# Patient Record
Sex: Female | Born: 1935 | Race: White | Hispanic: No | Smoking: Never smoker
Health system: Southern US, Community
[De-identification: ages and names within clinical notes are randomized; demographics above are authoritative.]

## PROBLEM LIST (undated history)

## (undated) DIAGNOSIS — K219 Gastro-esophageal reflux disease without esophagitis: Secondary | ICD-10-CM

## (undated) DIAGNOSIS — M810 Age-related osteoporosis without current pathological fracture: Secondary | ICD-10-CM

## (undated) DIAGNOSIS — R0602 Shortness of breath: Secondary | ICD-10-CM

## (undated) DIAGNOSIS — I2510 Atherosclerotic heart disease of native coronary artery without angina pectoris: Secondary | ICD-10-CM

## (undated) DIAGNOSIS — M1990 Unspecified osteoarthritis, unspecified site: Secondary | ICD-10-CM

## (undated) DIAGNOSIS — IMO0002 Reserved for concepts with insufficient information to code with codable children: Secondary | ICD-10-CM

## (undated) DIAGNOSIS — J942 Hemothorax: Secondary | ICD-10-CM

## (undated) DIAGNOSIS — I10 Essential (primary) hypertension: Secondary | ICD-10-CM

## (undated) DIAGNOSIS — R609 Edema, unspecified: Secondary | ICD-10-CM

## (undated) DIAGNOSIS — J189 Pneumonia, unspecified organism: Secondary | ICD-10-CM

## (undated) DIAGNOSIS — E559 Vitamin D deficiency, unspecified: Secondary | ICD-10-CM

## (undated) DIAGNOSIS — H269 Unspecified cataract: Secondary | ICD-10-CM

## (undated) DIAGNOSIS — I509 Heart failure, unspecified: Secondary | ICD-10-CM

## (undated) DIAGNOSIS — I252 Old myocardial infarction: Secondary | ICD-10-CM

## (undated) DIAGNOSIS — I739 Peripheral vascular disease, unspecified: Secondary | ICD-10-CM

## (undated) DIAGNOSIS — W19XXXA Unspecified fall, initial encounter: Secondary | ICD-10-CM

## (undated) DIAGNOSIS — R7989 Other specified abnormal findings of blood chemistry: Secondary | ICD-10-CM

## (undated) DIAGNOSIS — H9190 Unspecified hearing loss, unspecified ear: Secondary | ICD-10-CM

## (undated) DIAGNOSIS — F40240 Claustrophobia: Secondary | ICD-10-CM

## (undated) DIAGNOSIS — I779 Disorder of arteries and arterioles, unspecified: Secondary | ICD-10-CM

## (undated) DIAGNOSIS — Y92009 Unspecified place in unspecified non-institutional (private) residence as the place of occurrence of the external cause: Secondary | ICD-10-CM

## (undated) HISTORY — DX: Essential (primary) hypertension: I10

## (undated) HISTORY — DX: Unspecified place in unspecified non-institutional (private) residence as the place of occurrence of the external cause: Y92.009

## (undated) HISTORY — DX: Heart failure, unspecified: I50.9

## (undated) HISTORY — PX: CARDIAC CATHETERIZATION: SHX172

## (undated) HISTORY — DX: Unspecified fall, initial encounter: W19.XXXA

## (undated) HISTORY — DX: Edema, unspecified: R60.9

## (undated) HISTORY — DX: Atherosclerotic heart disease of native coronary artery without angina pectoris: I25.10

## (undated) HISTORY — DX: Hemothorax: J94.2

## (undated) HISTORY — PX: APPENDECTOMY: SHX54

## (undated) HISTORY — DX: Peripheral vascular disease, unspecified: I73.9

## (undated) HISTORY — DX: Old myocardial infarction: I25.2

## (undated) HISTORY — DX: Reserved for concepts with insufficient information to code with codable children: IMO0002

## (undated) HISTORY — DX: Disorder of arteries and arterioles, unspecified: I77.9

## (undated) HISTORY — PX: TONSILLECTOMY: SUR1361

## (undated) HISTORY — PX: HAND SURGERY: SHX662

---

## 1999-09-18 ENCOUNTER — Ambulatory Visit (HOSPITAL_COMMUNITY): Admission: RE | Admit: 1999-09-18 | Discharge: 1999-09-19 | Payer: Self-pay | Admitting: *Deleted

## 2006-05-05 ENCOUNTER — Ambulatory Visit: Payer: Self-pay | Admitting: Cardiology

## 2006-05-27 ENCOUNTER — Ambulatory Visit: Payer: Self-pay | Admitting: Cardiology

## 2006-06-02 ENCOUNTER — Inpatient Hospital Stay (HOSPITAL_BASED_OUTPATIENT_CLINIC_OR_DEPARTMENT_OTHER): Admission: RE | Admit: 2006-06-02 | Discharge: 2006-06-02 | Payer: Self-pay | Admitting: Cardiology

## 2006-06-02 ENCOUNTER — Ambulatory Visit: Payer: Self-pay | Admitting: Cardiology

## 2006-06-18 ENCOUNTER — Ambulatory Visit: Payer: Self-pay | Admitting: Physician Assistant

## 2007-01-20 ENCOUNTER — Ambulatory Visit: Payer: Self-pay | Admitting: Cardiology

## 2007-01-23 ENCOUNTER — Ambulatory Visit: Payer: Self-pay | Admitting: Cardiology

## 2007-01-23 ENCOUNTER — Encounter: Payer: Self-pay | Admitting: Cardiology

## 2007-05-07 ENCOUNTER — Ambulatory Visit: Payer: Self-pay | Admitting: Cardiology

## 2007-05-21 ENCOUNTER — Ambulatory Visit: Payer: Self-pay | Admitting: Cardiology

## 2007-05-25 ENCOUNTER — Encounter: Payer: Self-pay | Admitting: Cardiology

## 2007-05-26 ENCOUNTER — Inpatient Hospital Stay (HOSPITAL_COMMUNITY): Admission: EM | Admit: 2007-05-26 | Discharge: 2007-05-27 | Payer: Self-pay | Admitting: Cardiovascular Disease

## 2007-05-26 ENCOUNTER — Inpatient Hospital Stay (HOSPITAL_BASED_OUTPATIENT_CLINIC_OR_DEPARTMENT_OTHER): Admission: RE | Admit: 2007-05-26 | Discharge: 2007-05-26 | Payer: Self-pay | Admitting: Cardiovascular Disease

## 2007-05-26 ENCOUNTER — Ambulatory Visit: Payer: Self-pay | Admitting: Cardiovascular Disease

## 2007-05-26 ENCOUNTER — Encounter: Payer: Self-pay | Admitting: Cardiology

## 2007-06-12 ENCOUNTER — Ambulatory Visit: Payer: Self-pay | Admitting: Cardiology

## 2007-07-02 ENCOUNTER — Ambulatory Visit: Payer: Self-pay | Admitting: Cardiology

## 2007-08-04 ENCOUNTER — Encounter: Payer: Self-pay | Admitting: Cardiology

## 2007-10-28 ENCOUNTER — Ambulatory Visit: Payer: Self-pay | Admitting: Cardiology

## 2008-02-19 ENCOUNTER — Encounter: Payer: Self-pay | Admitting: Cardiology

## 2008-06-30 ENCOUNTER — Ambulatory Visit: Payer: Self-pay | Admitting: Cardiology

## 2008-11-08 ENCOUNTER — Encounter: Payer: Self-pay | Admitting: Cardiology

## 2008-11-29 ENCOUNTER — Encounter: Payer: Self-pay | Admitting: Cardiology

## 2008-12-13 DIAGNOSIS — I10 Essential (primary) hypertension: Secondary | ICD-10-CM

## 2008-12-13 DIAGNOSIS — R609 Edema, unspecified: Secondary | ICD-10-CM | POA: Insufficient documentation

## 2009-01-26 ENCOUNTER — Ambulatory Visit: Payer: Self-pay | Admitting: Cardiology

## 2009-05-29 ENCOUNTER — Encounter: Payer: Self-pay | Admitting: Cardiology

## 2009-08-10 ENCOUNTER — Ambulatory Visit: Payer: Self-pay | Admitting: Cardiology

## 2009-08-10 DIAGNOSIS — I6529 Occlusion and stenosis of unspecified carotid artery: Secondary | ICD-10-CM

## 2009-08-10 DIAGNOSIS — E785 Hyperlipidemia, unspecified: Secondary | ICD-10-CM | POA: Insufficient documentation

## 2009-08-18 ENCOUNTER — Encounter: Payer: Self-pay | Admitting: Cardiology

## 2009-08-23 ENCOUNTER — Encounter: Payer: Self-pay | Admitting: Cardiology

## 2009-08-30 ENCOUNTER — Encounter: Payer: Self-pay | Admitting: Cardiology

## 2009-08-30 ENCOUNTER — Ambulatory Visit: Payer: Self-pay | Admitting: Vascular Surgery

## 2010-03-06 ENCOUNTER — Ambulatory Visit: Payer: Self-pay | Admitting: Vascular Surgery

## 2010-03-18 HISTORY — PX: COLONOSCOPY: SHX174

## 2010-04-19 NOTE — Miscellaneous (Signed)
Summary: Orders Update - [facility]  Clinical Lists Changes  Orders: Added new Referral order of [facility] Referral ([facility] Ref) - Signed 

## 2010-04-19 NOTE — Letter (Signed)
Summary: [facility]   [facility]   Imported By: Roderic Ovens 09/27/2009 15:31:22  _____________________________________________________________________  External Attachment:    Type:   Image     Comment:   External Document

## 2010-04-19 NOTE — Letter (Signed)
Summary: External Correspondence/ FAXED [facility]  External Correspondence/ [facility]ED [facility]   Imported By: Dorise Hiss 08/25/2009 11:57:58  _____________________________________________________________________  External Attachment:    Type:   Image     Comment:   External Document

## 2010-04-19 NOTE — Assessment & Plan Note (Signed)
Summary: 6 MO FU PER MAY REMINDER      Allergies Added: ! SULFA  Visit Type:  Follow-up Primary Provider:  Sherryll Burger   History of Present Illness: the patient is a77 year old female with history of single-vessel coronary disease. The patient also past peripheralvascular disease. She reports a single episode of substernal chest pain. This was several weeks ago which required nitroglycerin. The pain was more epigastric with radiation into the right flank. The patient was not sure if this was some indigestion. She reports however no exertional chest pain shortness of breath orthopnea PND chest no palpitations or syncope. An echocardiogram was done by her primary care physician last year which showed normal LV function.  Preventive Screening-Counseling & Management  Alcohol-Tobacco     Smoking Status: never  Current Medications (verified): 1)  Vytorin 10-20 Mg Tabs (Ezetimibe-Simvastatin) .... Take 1 Tablet By Mouth Once A Day 2)  Atenolol 50 Mg Tabs (Atenolol) .... Take 1 Tablet By Mouth Once A Day 3)  Diovan Hct 160-25 Mg Tabs (Valsartan-Hydrochlorothiazide) .... Take 1 Tablet By Mouth Once A Day 4)  Aspirin 81 Mg Tbec (Aspirin) .... Take 1 Tablet By Mouth Once A Day 5)  Nitrostat 0.4 Mg Subl (Nitroglycerin) .... Use As Directed  Allergies (verified): 1)  ! Sulfa  Comments:  Nurse/Medical Assistant: The patient's medications and allergies were reviewed with the patient and were updated in the Medication and Allergy Lists. Bottles reviewed.  Past History:  Past Medical History: Last updated: 01/26/2009 HYPERTENSION, UNSPECIFIED (ICD-401.9) EDEMA (ICD-782.3) CAD, NATIVE VESSEL (ICD-414.01) 1. Coronary artery disease with details above.single-vessel CAD status post stent to the LAD. 2. Edema, resolved. 3. Hypertension, controlled.     Past Surgical History: Last updated: 12/13/2008 Tonsillectomy  Family History: Last updated: 12/13/2008 Family History of Cancer:  Family  History of Coronary Artery Disease:   Social History: Last updated: 12/13/2008 Retired  Widowed  Tobacco Use - Former.  Alcohol Use - no Regular Exercise - yes Drug Use - no  Risk Factors: Smoking Status: never (08/10/2009)  Vital Signs:  Patient profile:   77 year old female Height:      63 inches Weight:      128 pounds Pulse rate:   60 / minute BP sitting:   131 / 75  (left arm) Cuff size:   regular  Vitals Entered By: Carlye Grippe (Aug 10, 2009 1:06 PM)  Physical Exam  Additional Exam:  General: Well-developed, well-nourished in no distress head: Normocephalic and atraumatic eyes PERRLA/EOMI intact, conjunctiva and lids normal nose: No deformity or lesions mouth normal dentition, normal posterior pharynx neck: Supple, no JVD.  No masses, thyromegaly or abnormal cervical nodes. Soft right carotid bruit lungs: Normal breath sounds bilaterally without wheezing.  Normal percussion heart: regular rate and rhythm with normal S1 and S2, no S3 or S4.  PMI is normal.  No pathological murmurs abdomen: Normal bowel sounds, abdomen is soft and nontender without masses, organomegaly or hernias noted.  No hepatosplenomegaly musculoskeletal: Back normal, normal gait muscle strength and tone normal pulsus: Pulse is normal in all 4 extremities Extremities: No peripheral pitting edema neurologic: Alert and oriented x 3 skin: Intact without lesions or rashes cervical nodes: No significant adenopathy psychologic: Normal affect    EKG  Procedure date:  08/10/2009  Findings:      normal sinus rhythm no acute ischemic changes  Impression & Recommendations:  Problem # 1:  CAD, NATIVE VESSEL (ICD-414.01) the patient is single episode of chest pain. He did  not sound suspicious for angina. Her EKG is within normal limits with no intercurrent development of Q waves. The following medications were removed from the medication list:    Plavix 75 Mg Tabs (Clopidogrel bisulfate) .Marland Kitchen...  Take 1 tablet by mouth once a day Her updated medication list for this problem includes:    Atenolol 50 Mg Tabs (Atenolol) .Marland Kitchen... Take 1 tablet by mouth once a day    Aspirin 81 Mg Tbec (Aspirin) .Marland Kitchen... Take 1 tablet by mouth once a day    Nitrostat 0.4 Mg Subl (Nitroglycerin) ..... Use as directed  Orders: EKG w/ Interpretation (93000)  Problem # 2:  CAROTID ARTERY DISEASE (ICD-433.10)  the patient has significant carotid artery disease and will need a followup carotid Doppler. The following medications were removed from the medication list:    Plavix 75 Mg Tabs (Clopidogrel bisulfate) .Marland Kitchen... Take 1 tablet by mouth once a day Her updated medication list for this problem includes:    Aspirin 81 Mg Tbec (Aspirin) .Marland Kitchen... Take 1 tablet by mouth once a day  Orders: Carotid Duplex (Carotid Duplex)  Problem # 3:  HYPERTENSION, UNSPECIFIED (ICD-401.9) blood pressure is well controlled. Her updated medication list for this problem includes:    Atenolol 50 Mg Tabs (Atenolol) .Marland Kitchen... Take 1 tablet by mouth once a day    Diovan Hct 160-25 Mg Tabs (Valsartan-hydrochlorothiazide) .Marland Kitchen... Take 1 tablet by mouth once a day    Aspirin 81 Mg Tbec (Aspirin) .Marland Kitchen... Take 1 tablet by mouth once a day  Problem # 4:  DYSLIPIDEMIA (ICD-272.4) followup the patient's primary care physician. Her updated medication list for this problem includes:    Vytorin 10-20 Mg Tabs (Ezetimibe-simvastatin) .Marland Kitchen... Take 1 tablet by mouth once a day  Patient Instructions: 1)  Carotid Doppler  2)  Stop Plavix 3)  Follow up in  1 year

## 2010-07-20 ENCOUNTER — Telehealth: Payer: Self-pay | Admitting: Cardiology

## 2010-07-23 NOTE — Telephone Encounter (Signed)
Left message at residence to have patient return call.

## 2010-07-26 NOTE — Telephone Encounter (Signed)
No return call as of this date.

## 2010-07-27 ENCOUNTER — Telehealth: Payer: Self-pay | Admitting: Cardiology

## 2010-07-29 HISTORY — PX: PR VEIN BYPASS GRAFT,AORTO-FEM-POP: 35551

## 2010-07-31 NOTE — Assessment & Plan Note (Signed)
[facility]                          [facility] NOTE   NAME:HOPKINSClarise, Chacko                    MRN:          [id number]  DATE:05/21/2007                            DOB:          [birth year]    REFERRING PHYSICIAN:  Kirstie Peri, MD   HISTORY OF PRESENT ILLNESS:  The patient is a 77 year old female with a  history of moderate coronary artery disease but abnormal Cardiolite  study as documented in the note from May 07, 2007.  Regarding the  details regarding this patient's chest and jaw pain in this note, the  patient now presents for follow-up. She had an echo done in Dr. Sherril Croon  office but no results are available as reportedly the note was not  dictated yet.  The patient continues to complain of jaw and neck pain  upon exertion consistent with angina.  Last office visit her amlodipine  was increased with some minor relief in her chest symptomatology.   MEDICATIONS:  Vytorin, atenolol, aspirin, Diovan/hydrochlorothiazide,  amlodipine 10 mg a day.   PHYSICAL EXAMINATION:  VITAL SIGNS:  Blood pressure 122/72, heart rate  68, weights 127 pounds.  NECK:  No carotid upstrokes, no carotid bruits.  LUNGS:  Clear.  HEART:  Regular rate and rhythm.  Normal S1, S2.  ABDOMEN:  Soft.  EXTREMITIES:  No clubbing, cyanosis or edema.  NEURO:  Patient alert, oriented and grossly nonfocal.   PROBLEM LIST:  1. Exertional angina.  2. Coronary artery disease, see details prior note.  3. Normal LV function.  4. Cerebrovascular disease with a right carotid bruit and a 50-69%      previous stenosis in the right ICA.   PLAN:  1. The patient continues to have symptoms of chest pain and tightness      particularly in the jaws despite increasing her Norvasc.  2. The patient had an abnormal Cardiolite study and a given her      ongoing symptomatology will proceed with a cardiac catheterization      to reassess her coronary anatomy.    [name],  MD,FACC  Electronically Signed   GED/MedQ  DD: 05/21/2007  DT: 05/21/2007  Job #: [id number]   cc:   Kirstie Peri, MD

## 2010-07-31 NOTE — Assessment & Plan Note (Signed)
[facility]                          [facility] NOTE   KARIZMA, CHEEK                    MRN:          [id number]  DATE:05/07/2007                            DOB:          May 09, 1929    PRIMARY CARE PHYSICIAN:  Kirstie Peri, M.D.   CARDIOLOGIST:  [name], M.D., F.A.C.C.   REASON FOR VISIT:  Three-month followup.   HISTORY OF PRESENT ILLNESS:  [name] is a 77 year old female patient  with coronary artery disease, who presents to the office today for  followup.  She was last studied with cardiac catheterization by Dr.  Samule Ohm in March 2008.  She had an abnormal stress study prior to this  with normal perfusion, but significantly abnormal electrocardiograms.  She had stable anatomy at that time and was treated medically.  She last  saw Dr. Andee Lineman in November of 2008.  She was complaining of exertional  chest and jaw discomfort.  She was set up for an adenosine Cardiolite  January 23, 2007.  This revealed mild anterior wall ischemia and a  normal EF of 67%.   She returns to the office today for followup.  Overall, she is doing  about the same.  She notes that she has exertional discomfort in her  chest that radiates up into her jaw, down her arms.  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  When she mops, she gets  her discomfort.  When she walks to church, she gets the discomfort.  She  has noted it once in the morning, at rest.  Other than that, she has  mainly had discomfort with exertion.  She does note some nausea, at  times.  She denies associated shortness of breath, diaphoresis or  syncope.  She has taken nitroglycerin on occasion and it helps.  When  Dr. Andee Lineman saw her last, he placed her on amlodipine 5 mg a day.  She  notes just minimal improvement since that time.  She denies any  worsening symptoms since last being seen.   CURRENT MEDICATIONS:  1. Vytorin 10/20 mg daily.  2. Atenolol  50 mg daily.  3. Diovan/HCTZ 160/25 mg daily.  4. Aspirin 81 mg daily.  5. Amlodipine 5 mg daily.  6. Nitroglycerin p.r.n. chest pain.  7. Calcium as needed.   ALLERGIES:  SULFA.   SOCIAL HISTORY:  She denies any tobacco abuse.  She is widowed and lives  with her grandson.   REVIEW OF SYSTEMS:  Please see HPI.  She denies any fevers, chills,  cough, melena, hematochezia, hematuria.  Rest of the review of systems  is negative.   PHYSICAL EXAM:  She is a well-nourished, well-developed female, in no  acute distress.  Blood pressure 116/70, pulse 71, weight 125.2 pounds.  HEENT:  Normal.  NECK:  Without JVD.  CARDIAC:  Normal S1, S2.  Regular rate and rhythm without murmur.  LUNGS:  Clear to auscultation bilaterally.  ABDOMEN:  Soft, nontender.  EXTREMITIES:  Without edema.  Calves are soft, nontender.  SKIN:  Warm and dry.  NEUROLOGIC:  She  is alert and oriented times three.  Cranial nerves II  through XII are grossly intact.  VASCULAR EXAM:  Femoral artery pulses are 2+ bilaterally, without  bruits.   Electrocardiogram reveals heart rate of 63, normal axis, nonspecific STT-  wave changes.   DATA BASE:  Labs performed recently, on April 28, 2007, revealing  creatinine 1.1, normal LFTs, HDL 43, LDL 113, TSH 1.62, hemoglobin 12.6.  Of note, the patient recently restarted her Vytorin approximately two  weeks ago.   IMPRESSION:  1. Stable exertional angina.  2. Coronary artery disease.      a.     Status post rotational atherectomy and balloon angioplasty       and stenting to the RCA in July of 2001.      b.     Nonobstructive coronary artery disease and patent stent in       the distal RCA by catheterization, June 02, 2006; chronically       occluded first obtuse marginal - medical therapy.      c.     Adenosine Cardiolite, November 2008, with mild anterior wall       ischemia.  3. Good LV function.  4. Cerebrovascular disease.      a.     50-69% RICA stenosis -  needs followup in March 2009.  5. Hypertension.  6. Hyperlipidemia.      a.     Followed by Dr. Sherryll Burger.   DISCUSSION:  Ms. Ghattas returns to the office today for followup.  She  continues to complain of stable exertional angina.  There has really  been no significant change.  She has had just minimal response to  amlodipine.  She has been intolerant to Imdur in the past, secondary to  significant headaches.  She was also interviewed and examined by Dr.  Andee Lineman today.   PLAN:  1. Increase her amlodipine to 10 mg daily.  2. Decrease her Diovan/HCTZ 160/25 mg to a half tablet daily.  3. We will have her return in followup in two weeks.  If she continues      to have symptoms of      exertional angina without change, then we will recommend cardiac      catheterization at that point in time to further evaluate her      anatomy.      Tereso Newcomer, PA-C  Electronically Signed      [name], MD,FACC  Electronically Signed   SW/MedQ  DD: 05/07/2007  DT: 05/08/2007  Job #: [id number]   cc:   Kirstie Peri, MD

## 2010-07-31 NOTE — Cardiovascular Report (Signed)
[name], [name]             ACCOUNT NO.:  [id number]   MEDICAL RECORD NO.:  [id number]          PATIENT TYPE:  OIB   LOCATION:  6525                         FACILITY:  [facility]   PHYSICIAN:  Veverly Fells. Excell Seltzer, MD  DATE OF BIRTH:  06/21/1929   DATE OF PROCEDURE:  DATE OF DISCHARGE:                            CARDIAC CATHETERIZATION   PROCEDURE:  Percutaneous transluminal coronary angioplasty and stenting  of the proximal left anterior descending.   INDICATIONS:  [name] is a 76 year old woman who has been  experiencing increasing angina.  She documented moderate CAD from  previous catheterizations.  She underwent a stress test that reproduced  her symptoms and had significant ST depression.  Diagnostic  catheterization showed severe proximal LAD stenosis.  Other findings  were stable compared to a previous catheter.  See the diagnostic cath  report for details.  She was admitted and brought upstairs for a PCI.   Risks and indications of procedure were reviewed with the patient.  Informed consent was obtained.  There was 4-French sheath present in the  right femoral artery.  The patient been preloaded with 600 mg of Plavix  and had received intravenous heparin.  The 4-French sheath was changed  out over a wire using normal sterile technique and upsized to a 6-French  sheath.  Angiomax was started for anticoagulation and initial ACT was  less than 200 prior to starting Angiomax.  Once therapeutic ACT was  achieved, a 6-French XB 3.5-cm side-hole guide catheter was inserted.  The initial angiography confirmed severe proximal LAD stenosis with  moderate to heavy calcification.  The lesion was wired with a moderate  amount of difficulty using a Cougar guidewire.  The lesion was then  predilated with a 2 x 12-mm Maverick balloon up to 10 atmospheres.  The  balloon expanded well.  Following predilatation, I elected to stent the  vessel with a 2.25 x 12-mm Taxus-Atom stent.  Even  though the lesion  location was very proximal in the LAD, the vessel appeared quite small.  The stent was carefully positioned nearly back to the ostium and then  deployed at that was deployed a 12 atmospheres.  It appeared well  expanded.  There was a step-up off the proximal LAD into the stent and a  good transition distally.  I elected to post dilate with 2.25 x 8-mm  Quantum Maverick balloon which was taken to 16 atmospheres on two  inflations.  The patient tolerated the procedure well.  The stent  appeared widely expanded and there was TIMI-3 flow throughout the  vessel.  There were no immediate complications.  There is mild stenosis  on the proximal edge of the stent just at the ostium of the LAD but this  does not appear obstructive.  The guide catheter was removed and the  patient was transferred to the holding area in stable condition where  her femoral arterial sheath will be pulled 2 hours after discontinuation  of Angiomax.   ASSESSMENT:  Successful percutaneous coronary intervention of severe  stenosis in the proximal left anterior descending using a 2.25 x 12-mm  Taxus-Atom  stent.   RECOMMENDATIONS:  Dual antiplatelet therapy with aspirin and Plavix  greater than or equal to 12 months.  The patient will be observed  overnight and if no complications occur, then she will be discharged  home in the morning.      Veverly Fells. Excell Seltzer, MD  Electronically Signed     MDC/MEDQ  D:  05/26/2007  T:  05/27/2007  Job:  [id number]   cc:   [name], MD,FACC

## 2010-07-31 NOTE — Cardiovascular Report (Signed)
[name], [name]             ACCOUNT NO.:  [id number]   MEDICAL RECORD NO.:  [id number]          PATIENT TYPE:  OIB   LOCATION:  NA                           FACILITY:  [facility]   PHYSICIAN:  Veverly Fells. Excell Seltzer, MD  DATE OF BIRTH:  [birth year]   DATE OF PROCEDURE:  05/26/2007  DATE OF DISCHARGE:                            CARDIAC CATHETERIZATION   PROCEDURE:  Left heart catheterization, selective coronary angiography,  left ventricular angiography.   INDICATIONS:  [name] is a 77 year old woman with known CAD.  She  has had moderate stenosis described in her proximal LAD, as well as an  occluded circumflex branch that is collateralized from the right  coronary artery.  She has been managed medically over several years and  has done relatively well.  She has had increased angina over the last  several months.  She even described typical anginal symptoms this  morning just when getting out of that.  Her angina has been nitrate  responsive.  She underwent a stress Myoview scan with normal perfusion,  but she did have typical symptoms with chest and jaw pain, as well as  significant EKG changes.  She was referred for cardiac catheterization  with high suspicion for progressive CAD.   FINDINGS:  Aortic pressure 105/45, left ventricular pressure 105/7.   CORONARY ANGIOGRAPHY:  The left mainstem is moderately calcified.  There  is mild ostial and distal left main stenosis, both approximately 30%.  The left mainstem bifurcates into the LAD and left circumflex.   The LAD is a relatively small caliber vessel that courses down and  reaches the LV apex.  It is moderately calcified throughout its proximal  and mid segments.  The LAD supplies a small first diagonal and a larger  second diagonal branch.  The proximal LAD has an eccentric calcified 80%  stenosis that has clearly progressed since the previous study.  The area  is very irregular in appearance.  There is diffuse plaque  throughout,  and then after the second diagonal there is a 40% stenosis.   The left circumflex has mild disease proximally.  The first OM branch is  occluded.  It fills via a well-formed collateral from the right coronary  artery.  The second OM branch has a 70% stenosis and is relatively small  in nature.  The vessel courses down and supplies two small  posterolateral branches, as well as an atrial branch.  There are no  severe stenoses in the left circumflex system.   The right coronary artery is patent.  The proximal to midportion is  moderately calcified.  There is diffuse 40-50% stenosis throughout the  proximal and mid vessel.  After the first RV marginal branch, the vessel  increases in size and has only mild plaque.  There is a widely patent  stent in the distal right coronary artery.  The vessel bifurcates into  the PDA and posterolateral branch distally.  The right coronary artery  also supplies some collateral vessels to distal septal perforators of  the LAD.  There is a well-formed collateral from the distal PDA to a  circ marginal branch.   Left ventriculography demonstrates hyperdynamic LV function with an LVEF  of 75%.  There is no mitral regurgitation.   ASSESSMENT:  1. Severe proximal left anterior descending stenosis.  2. Chronic obtuse marginal branch occlusion with well-formed right-to-      left collateral.  3. Moderate right coronary artery stenosis with a widely patent stent      in the distal vessel.  4. Normal left ventricular function.   RECOMMENDATIONS:  [name] has severe proximal LAD stenosis with  progressive angina.  I think she should be admitted and treated with  PCI.  Her vessel is borderline in size, but I think it will be treatable  with a drug-eluting stent.  She is given heparin and Plavix and will  plan on PCI later today.      Veverly Fells. Excell Seltzer, MD  Electronically Signed     MDC/MEDQ  D:  05/26/2007  T:  05/27/2007  Job:   [id number]   cc:   [name], MD,FACC

## 2010-07-31 NOTE — Assessment & Plan Note (Signed)
[facility]                          [facility]   NAME:[name], [name]                    MRN:          [id number]  DATE:01/20/2007                            DOB:          1929-05-27    HISTORY OF PRESENT ILLNESS:  The patient is a 77 year old female with a  history of coronary artery disease.  The patient is status post cardiac  catheterization earlier this year in March.  She was found to have  nonobstructive coronary artery disease with a patent stent to the distal  right coronary artery.  Study was performed by Dr. Samule Ohm.  The ejection  fraction was normal with the ejection fraction at 70%.  The patient also  has moderate carotid artery disease.   The patient now reports that over the last six weeks she has had  intermittent substernal chest pain as well as bilateral shoulder pain  radiating down both arms.  She states that at times these episodes last  10 to 15 minutes and are associated with pain in the jaws.  For the most  part these appear to be exertional.  The patient does report a couple of  episodes at rest; the first time she had resting episode she took a  nitroglycerin and two aspirin which promptly relieved her pain.  The  patient is INTOLERANT TO IMDUR which gives her a headache.  She has not  tried to take any other nitroglycerin tablets in the meanwhile.  She  denies any nausea, vomiting, fevers, chills, palpitations or syncope.   MEDICATIONS:  1. Vytorin 10/20 mg p.o. daily.  2. Atenolol 50 mg p.o. daily.  3. Diovan/hydrochlorothiazide 160/12.5 mg p.o. daily.  4. Aspirin 81 mg daily.   PHYSICAL EXAMINATION:  VITAL SIGNS:  Blood pressure 143/79, heart rate  is 59.  Weight is 123 pounds.  NECK:  Normal carotid upstrokes, no carotid bruits.  LUNGS:  Clear breath sounds bilaterally.  HEART:  Regular rate and rhythm.  Normal S1, S2.  No murmurs, rubs or  gallops.  ABDOMEN: Soft, nontender.  No rebound,  tenderness or guarding. Good  bowel sounds.  EXTREMITIES:  Exam shows no cyanosis, clubbing or edema.  NEUROLOGICAL:  The patient is alert, oriented and grossly nonfocal.   PROBLEM LIST:  1. Coronary artery disease.      a.     Status post rotational atherectomy and balloon angioplasty       with stenting to the right coronary artery in July of 2001.      b.     Nonobstructive coronary artery disease as outlined above,       patent stent in distal right coronary artery by catheterization       June 02, 2006 by Dr. Samule Ohm.      c.     Recurrent substernal chest pain, concerning for angina.  2. Preserved left ventricular function with ejection fraction of 70%.  3. Right carotid artery bruit and carotid disease.      a.     Carotid duplex scan revealed 50-69 right internal carotid       artery  stenosis.  4. Hypertension, poorly controlled.  5. Dyslipidemia.   PLAN:  The patient was prescribed Norvasc as part of an antianginal  regimen.  The patient also will be referred for a Cardiolite study later  this week.  If the study is positive, or the patient does not have  relief with Norvasc, then we will proceed with a cardiac  catheterization.  At this point, I want to try the treatment of medical  therapy first, given the fact that the patient had catheterization  approximately six months ago which only showed nonobstructive coronary  artery disease.  A prescription was faxed in through [facility] for  Norvasc to [facility]  the patient will be seen back in the office in one  month.     [name], MD,FACC  Electronically Signed    GED/MedQ  DD: 01/20/2007  DT: 01/21/2007  Job #: [id number]   cc:   Kirstie Peri, MD

## 2010-07-31 NOTE — Assessment & Plan Note (Signed)
[facility]                          [facility]   [name], [name]                    MRN:          [id number]  DATE:06/30/2008                            DOB:          [birth year]    REFERRING PHYSICIAN:  Kirstie Peri, MD   HISTORY OF PRESENT ILLNESS:  The patient is a 77 year old female with  history of coronary artery disease status post drug-eluting stent  placement to the proximal LAD.  She also has residual disease documented  by Dr. Diona Browner in previous note.  She is doing, however, very well.  She has no chest pain, shortness of breath, orthopnea, or PND.  She has  no palpitations or syncope.  She reported single episode of left-sided  chest pain which has basically resolved and also an episode of brief  abdominal pain but has had no further problems.  Her main concern is now  that she is suffering from allergies and congestion from the POLLEN.   MEDICATIONS:  1. Vytorin 10/20 mg p.o. daily.  2. Atenolol 50 mg p.o. daily.  3. Diovan hydrochlorothiazide 160/25 mg p.o. daily.  4. Aspirin 325 daily.  5. Plavix 75 mg p.o. daily.   PHYSICAL EXAMINATION:  VITAL SIGNS:  Blood pressure is 124/62, heart  rate 65, weight is 127 pounds.  GENERAL:  Well-nourished white female in no apparent distress.  HEENT:  Pupils; eyes are equal.  Conjunctivae are clear.  NECK:  Supple.  Normal carotid upstroke and no carotid bruits.  LUNGS:  Clear breath sounds bilaterally.  HEART:  Regular rate and rhythm.  Normal S1 and S2.  No murmur, rubs, or  gallops.  ABDOMEN:  Soft, nontender.  No rebound or guarding.  Good bowel sounds.  EXTREMITIES:  No cyanosis, clubbing, or edema.   PROBLEM:  1. Coronary artery disease with details above.  2. Edema, resolved.  3. Hypertension, controlled.   PLAN:  1. At this point in time the patient is stable from a coronary artery      disease perspective.  She      does not require any further  testing.  2. Cut her aspirin to 81 mg a day in conjunction with another year of      Plavix.     [name], MD,FACC  Electronically Signed    GED/MedQ  DD: 06/30/2008  DT: 07/01/2008  Job #: [id number]   cc:   Kirstie Peri, MD

## 2010-07-31 NOTE — Assessment & Plan Note (Signed)
[facility]                          [facility]   CAMEY, EDELL                    MRN:          [id number]  DATE:10/28/2007                            DOB:          1929-10-09    PRIMARY CARE PHYSICIAN:  Kirstie Peri, MD.   PRIMARY CARDIOLOGIST:  [name], MD, [facility]   REASON FOR VISIT:  Routine followup.   HISTORY OF PRESENT ILLNESS:  I am seeing Ms. Kastelic in Dr. Margarita Mail  absence.  She is a very pleasant 77 year old woman with a history of  coronary artery disease status post drug-eluting stent placement to the  proximal left anterior descending with residual disease including an  occluded obtuse marginal with distal collateralization and patent  previously placed stent within the distal right coronary artery.  She  has hyperdynamic left ventricular function.  Symptomatically, she has  done well without any significant angina or dyspnea.  She has been under  a lot of stress recently and has had problems with insomnia.  She states  that she had a water leak in her house and an 800-dollar water bill  precipitating this.  She is starting to feel better however.  As noted  in Dr. Margarita Mail prior note, she was having problems with lower extremity  edema that was felt to be related to amlodipine.  This medicine was  discontinued, and her swelling has subsided.   ALLERGIES:  SULFA DRUGS.   PRESENT MEDICATIONS:  1. Vytorin 10/20 mg p.o. daily.  2. Atenolol 50 mg p.o. daily.  3. Diovan HCT 160/25 mg p.o. daily.  4. Aspirin 325 mg p.o. daily.  5. Plavix 75 mg p.o. daily.  6. Sublingual nitroglycerin 0.4 mg p.r.n.   REVIEW OF SYSTEMS:  As per the history of present illness.  Otherwise,  negative.   PHYSICAL EXAMINATION:  VITAL SIGNS:  Blood pressure is 114/60, heart  rate is 70, weight is 127.8 pounds.  GENERAL:  The patient is comfortable, normally nourished in no acute  distress.  HEENT:  Conjunctivae and lids are  normal.  Oropharynx is clear.  NECK:  Supple.  No elevated jugular venous pressure or loud bruits.  CARDIAC:  Reveals a regular rate and rhythm.  No pathologic murmur or S3  gallop.  ABDOMEN:  Soft.  EXTREMITIES:  Exhibit no frank pitting edema.   LABORATORY DATA:  From May of this year demonstrated an LDL of 65 with  an HDL of 41 and normal liver function tests.   IMPRESSION AND RECOMMENDATIONS:  1. Cardiovascular disease as outlined above with hyperdynamic left      ventricular function.  The patient is symptomatically stable.  She      is on dual-antiplatelet therapy and understands the importance of      maintaining this at least for a year.  Her most recent drug-eluting      stent was placed in March 2009.  I will plan to have her return to      see Dr. Andee Lineman over the next 6 months.  2. Prior lower extremity edema related to amlodipine, resolved with  discontinuation of this medicine.  3. Hypertension, well controlled at present.     Jonelle Sidle, MD  Electronically Signed    SGM/MedQ  DD: 10/28/2007  DT: 10/29/2007  Job #: [id number]   cc:   [name], MD,FACC  Kirstie Peri, MD

## 2010-07-31 NOTE — Assessment & Plan Note (Signed)
[facility]                          [facility]   NAME:HOPKINSDurene, [name]                    MRN:          [id number]  DATE:07/02/2007                            DOB:          03/09/1930    HISTORY OF PRESENT ILLNESS:  The patient is a 77 year old female with a  history of recent stent placement to the LAD.  The patient was seen  postoperatively, and had been doing well.  She now was added on as a  walk-in due to complaints of increased lower extremity edema.  She  states that she has noticed swelling, which was particular worse,  yesterday, after a bible study.  She was concerned about it, and wanted  to be seen in the office today.  She denies any chest pain, shortness of  breath, and reports no fever or chills.  She did notice some slight  erythema in both lower extremities.   MEDICATIONS LISTED IN CHART INCLUDE:  1. Vytorin.  2. Atenolol.  3. Diovan.  4. Hydrochlorothiazide 160/12.5 one-half tab p.o. daily.  5. Amlodipine 10 mg a day.  6. Aspirin.  7. Plavix 75 mg p.o. daily.   PHYSICAL EXAMINATION:  VITAL SIGNS:  Blood pressure is 148/78; heart  rate 74; weight is 129 pounds.  NECK EXAM:  Normal carotid upstroke.  No carotid bruits.  LUNGS:  Clear breath sounds bilaterally.  HEART:  Regular rate and rhythm.  Normal S1-S2.  ABDOMEN:  Soft.  EXTREMITY EXAM:  No cyanosis, clubbing, or edema.  NEURO:  Patient alert and grossly nonfocal.   PROBLEM LIST:  1. Coronary status post recent stent to the LAD.  2. Normal LV function.  3. Cerebrovascular disease.  4. Lower extremity edema likely secondary to amlodipine.   PLAN:  1. The patient's lower extremity edema is likely secondary to      amlodipine.  She has no evidence of clinical congestive heart      failure.  2. I have stopped the patient's amlodipine, and we increased her back      on Diovan/hydrochlorothiazide to a full tablet a day in light of      her  hypertension.     [name], MD,FACC  Electronically Signed    GED/MedQ  DD: 07/02/2007  DT: 07/02/2007  Job #: [id number]   cc:   Dr. Sherryll Burger

## 2010-07-31 NOTE — Consult Note (Signed)
NEW PATIENT CONSULTATION   [name], [name]  DOB:  Jul 17, 1929                                       08/30/2009  ZOXWR#:[id number]   The patient presents today for evaluation of her asymptomatic  extracranial cerebrovascular occlusive disease.  She is a very pleasant  77 year old female who was noted to have a right carotid stenosis on  noninvasive studies and has been followed for several years regarding  this.  She has had progression of her stenosis and we are seeing her for  further discussion of this.  She specifically denies any prior amaurosis  fugax, transient ischemic attack or stroke.  She does have positive  coronary history with coronary stenting in the past.  She is not  diabetic.  She is hypertensive.  She does not smoke cigarettes.  She  does have elevated cholesterol and elevated blood pressure.   SOCIAL HISTORY:  She is widowed with 2 children.  She is retired.  She  quit smoking 40 years ago.  She has occasional alcohol consumption.   FAMILY HISTORY:  Significant for premature atherosclerotic disease in  mother and father with mother having bilateral carotid endarterectomies.  Father died of congestive heart failure.   REVIEW OF SYSTEMS:  No weight loss or weight gain.  Height is 5 feet 2  inches tall.  She weighs 128 pounds.  VASCULAR:  Negative aside from history of present illness as is cardiac.  GI:  Does have history of reflux and diarrhea.  NEUROLOGIC:  Negative.  PULMONARY, HEMATOLOGIC and URINARY:  Negative.  MUSCULOSKELETAL:  Positive for arthritis.  ENT, PSYCHIATRIC:  Negative.  SKIN:  Negative.   PHYSICAL EXAMINATION:  Well-developed, thin white female appearing  stated age.  Blood pressure 143/72 in her right arm, 146/72 in her left  arm.  Her heart rate is 60, respirations 18.  She is in acute stress.  HEENT:  Normal.  Lungs:  Clear bilaterally.  Heart:  Regular rate and  rhythm.  She has 2+ radial and  2+ dorsalis pedis  pulses.  Abdomen:  Soft, nontender.  No masses and no pulsatile mass.  Musculoskeletal:  No  joint deformities, cyanosis.  Neurologic:  No focal weakness or  paresthesias.  Skin:  Without ulcers or rashes.  She does not have  carotid bruits bilaterally.   I have reviewed her prior duplexes at [facility].  This did not  delineate her end-diastolic velocities.  She did have systolic  velocities suggesting at least moderate to severe stenosis.  She  underwent repeat carotid duplex on the right for confirmation today and  also to determine if had normal internal carotid artery distal to this.  She does have in the 60% to 79% stenosis range in the midportion of  this.   I had a long discussion with the patient.  I have explained that she has  no increased risk of stroke related to her degree of stenosis but have  recommended she undergo repeat carotid duplex in 6 months.  I discussed  in detail symptoms of carotid disease with her and she will notify us  immediately should this occur.  Otherwise we will see her again in 6  months with repeat carotid duplex.     Larina Earthly, M.D.  Electronically Signed   TFE/MEDQ  D:  08/30/2009  T:  08/31/2009  Job:  [id number]   cc:   [name], MD,FACC  Kirstie Peri, MD

## 2010-07-31 NOTE — Assessment & Plan Note (Signed)
[facility]                          [facility]   NAME:[name], [name]                    MRN:          [id number]  DATE:06/12/2007                            DOB:          1929-12-11    PRIMARY CARDIOLOGIST:  Dr. Lewayne Bunting.   REASON FOR VISIT:  Post-hospital followup.   [name] is a very pleasant woman with known coronary artery disease,  recently referred for an elective cardiac catheterization by Dr. Andee Lineman.  She was having recurrent chest and jaw pain, associated with exertion,  worrisome for significant coronary artery disease progression.   Patient was found to have an 80% calcified, proximal LAD lesion that had  clearly progressed since her previous study.  She was thus admitted for  percutaneous intervention, performed successfully by Dr. Excell Seltzer, with  placement of a Taxus-Adam stent.  She was discharged the following day  and is to remain on combination of aspirin/Plavix for at least one year.   Residual anatomy was notable for a widely patent distal RCA stent and  chronic 100% occlusion of the obtuse marginal branch with right-left  collateralization.  Left ventricular function was hyperdynamic (EF 75%),  with no mitral regurgitation.   [name] returns to the clinic reporting no complications of the  right-groin incision site.  More importantly, she has not had any  further jaw pain.   Electrocardiogram today reveals sinus bradycardia at 58 BPM with normal  axis and nonspecific ST abnormalities.   CURRENT MEDICATIONS:  1. Atenolol 50 daily.  2. Diovan/HCT 160/25 mg one half tablet daily.  3. Amlodipine 10 mg daily.  4. Plavix.  5. Full-dose aspirin.  6. Vytorin 10/20 daily.   PHYSICAL EXAMINATION:  Blood pressure 126/70, pulse 61, regular.  Weight  127.  GENERAL:  A 77 year old female, sitting upright, in no distress.  HEENT:  Normocephalic, atraumatic.  NECK:  Palpable bilateral carotid pulses with  loud right carotid bruit;  no bruit on the left.  LUNGS:  Clear to auscultation bilaterally.  HEART:  Regular rate and rhythm (S1, S2), positive S4.  No significant  murmurs.  No rubs.  ABDOMEN:  Soft, nontender, intact bowel sounds.  EXTREMITIES:  Right groin stable with no hematoma, ecchymosis, or bruit  to auscultation.  Intact femoral and distal pulse.  NEUROLOGIC:  No focal deficits.   IMPRESSION:  1. Coronary artery disease progression.      a.     Status post recent Taxus-Adam drug eluting stenting of a       high-grade proximal LAD stenosis.      b.     Residual chronic 100% occlusion of an obtuse marginal branch       with distal collateralization; widely patent distal RCA stent.      c.     Hyperdynamic LV function.      d.     Status post HSRA/stenting of distal RCA, July 2001.  2. Cerebrovascular disease.      a.     50-70% right ICA stenosis, less than 50% on the left, March       2009.  3. Hypertension.  4. Dyslipidemia.      a.     Vytorin recently resumed.   PLAN:  1. Continue current medication regimen, including aspirin/Plavix      combination, for at least one full year.  2. Follow up fasting lipid/liver profile in May, representing three-      month followup since patient resumed Vytorin on her own.  3. Schedule return clinic followup with myself and Dr. Andee Lineman in three      months.      Gene Serpe, PA-C  Electronically Signed      [name], MD,FACC  Electronically Signed   GS/MedQ  DD: 06/12/2007  DT: 06/12/2007  Job #: [id number]

## 2010-07-31 NOTE — Discharge Summary (Signed)
[name], [name]             ACCOUNT NO.:  [id number]   MEDICAL RECORD NO.:  [id number]          PATIENT TYPE:  OIB   LOCATION:  6525                         FACILITY:  [facility]   PHYSICIAN:  Veverly Fells. Excell Seltzer, MD  DATE OF BIRTH:  03/07/1930   DATE OF ADMISSION:  05/26/2007  DATE OF DISCHARGE:  05/27/2007                               DISCHARGE SUMMARY   PRIMARY CARDIOLOGIST:  Dr. Vernie Shanks. [place].   PRIMARY CARE PHYSICIAN:  Dr. Kirstie Peri in [place], [place].   INTERVENTIONALIST:  Dr. Veverly Fells. [place].   PROCEDURE:  1. Cardiac catheterization:      a.     LAD with proximal calcified 80% stenosis.  Chronic OM branch       occlusion with right to left collaterals, moderate right coronary       artery stenosis and widely patent distal right coronary artery       stent.  Normal LV function with an ejection fraction of 70%-75%.  2. Percutaneous transluminal coronary angiography and stenting of the      proximal LAD with a 2.25 Taxus ATDM drug-eluting stent reducing      from 80% to 0% on May 26, 2007, per Dr. Excell Seltzer.   DISCHARGE DIAGNOSES:  1. Known coronary artery disease:      a.     Crescendo angina with severe proximal left anterior       descending coronary artery stenosis, for cardiac catheterization       on May 26, 2007.  2. Cerebrovascular disease with right carotid bruit and a 50% to 69%      previous stenosis in the right internal coronary artery.  3. Hypertension.   HISTORY:  This is a 77 year old Caucasian female who is known by Dr.  Andee Lineman in [place], with a history of coronary artery disease with an  abnormal Cardiolite stress test with complaints of shortness of breath  and angina.  The pain radiated to the jaw and neck upon exertion.  The  patient was advised to have a repeat cardiac catheterization, secondary  to recurrent chest discomfort.   HOSPITAL COURSE:  The patient was seen and examined by Dr. Excell Seltzer on  May 26, 2007, for cardiac  catheterization, with the results discussed  above.  Please see Dr. Earmon Phoenix thorough cardiac catheterization  dictation for more details.  It was found that the patient had a severe  proximal LAD stenosis of 80% and did undergo a PCI with a Taxus drug-  eluting stent to the proximal LAD, reducing the calcification from 80%  to 0%.  The patient was started on aspirin and Plavix and continued on  other home medications as prior to admission.   The patient tolerated the procedure well.  There was no evidence of  bleeding, hematoma or signs of infection at the right groin site.  The  patient's blood pressure was well-controlled.  The patient denied any  further complaints of discomfort.   DISCHARGE VITAL SIGNS:  Blood pressure 127/56, heart rate 66,  respirations 18, temperature 97.1 degrees.   LABORATORY DATA:  Sodium 140, potassium 3.9, chloride  108, CO2 of 27,  BUN 11, creatinine 1.03, glucose 93.  Hemoglobin 11.3, hematocrit 32.6,  white blood cells 5.8, platelets 191.   DISCHARGE MEDICATIONS:  1. Aspirin 325 mg daily.  2. Plavix 75 mg daily.  3. Norvasc 10 mg daily.  4. Diovan 160/25 mg daily.  5. Atenolol 50 mg daily.  6. Vytorin 10/20 mg daily.   ALLERGIES:  SULFA.   FOLLOWUP/INSTRUCTIONS:  1. The patient is to follow up with Dr. Andee Lineman on Friday, June 12, 2007, at 11:45 a.m.  2. The patient is to follow up with Dr. Kirstie Peri, primary care      physician, in [place] for continued medical management.  3. The patient has been advised regarding a new medication with Plavix      to be started along with prescription provided.  4. The patient is given post-cardiac catheterization instructions,      with particular emphasis on the right groin site for evidence of      bleeding, hematoma or infection.   Time spent with the patient to include the physician's time was 35  minutes.      Bettey Mare. Lyman Bishop, NP      Veverly Fells. Excell Seltzer, MD  Electronically Signed     KML/MEDQ  D:  05/27/2007  T:  05/27/2007  Job:  [id number]   cc:   Kirstie Peri, MD

## 2010-07-31 NOTE — Procedures (Signed)
CAROTID DUPLEX EXAM   INDICATION:  Carotid disease.   HISTORY:  Diabetes:  No.  Cardiac:  Stents.  Hypertension:  Yes.  Smoking:  No.  Previous Surgery:  No.  CV History:  Currently asymptomatic.  Amaurosis Fugax No, Paresthesias No, Hemiparesis No.                                       RIGHT             LEFT  Brachial systolic pressure:  Brachial Doppler waveforms:         Normal  Vertebral direction of flow:        Antegrade  DUPLEX VELOCITIES (cm/sec)  CCA peak systolic                   60  ECA peak systolic                   86  ICA peak systolic                   263  ICA end diastolic                   63  PLAQUE MORPHOLOGY:                  Calcific  PLAQUE AMOUNT:                      Moderate  PLAQUE LOCATION:                    ICA/bifurcation   IMPRESSION:  Doppler velocities suggest a 60% to 79% stenosis of the  right proximal internal carotid artery.   ___________________________________________  Larina Earthly, M.D.   CH/MEDQ  D:  08/30/2009  T:  08/30/2009  Job:  [id number]

## 2010-07-31 NOTE — Procedures (Signed)
CAROTID DUPLEX EXAM   INDICATION:  Carotid disease.   HISTORY:  Diabetes:  No.  Cardiac:  Stents.  Hypertension:  Yes.  Smoking:  No.  Previous Surgery:  No.  CV History:  Currently asymptomatic.  Amaurosis Fugax No, Paresthesias No, Hemiparesis No.                                       RIGHT             LEFT  Brachial systolic pressure:         154               156  Brachial Doppler waveforms:         Normal            Normal  Vertebral direction of flow:        Antegrade         Antegrade  DUPLEX VELOCITIES (cm/sec)  CCA peak systolic                   100               81  ECA peak systolic                   92                111  ICA peak systolic                   279               73  ICA end diastolic                   90                19  PLAQUE MORPHOLOGY:                  Mixed             Mixed  PLAQUE AMOUNT:                      Moderate/severe   Mild  PLAQUE LOCATION:                    ICA               ICA   IMPRESSION:  1. Doppler velocities suggest a 60% to 79% stenosis of the right      proximal internal carotid artery.  2. No hemodynamically significant stenosis of the left internal      carotid artery with plaque formations as described above.  3. No significant change in the right internal carotid artery noted      when compared to the previous examination on 08/30/2009.   ___________________________________________  Larina Earthly, M.D.   CH/MEDQ  D:  03/07/2010  T:  03/07/2010  Job:  [id number]

## 2010-07-31 NOTE — Assessment & Plan Note (Signed)
OFFICE VISIT   [name], [name]  DOB:  16-Jun-1929                                       03/06/2010  UJWJX#:[id number]   Patient presents today for continued follow-up of her asymptomatic  carotid disease.  She is in her usual state of health.  She did have a  left arm fracture.  She fell getting out of bed.  She did well with  pinning of this and has regaining her normal function.  She specifically  denies any amaurosis fugax, transient ischemic attack, or stroke.  She  does have a history of coronary stenting and has not had any new cardiac  difficulties.   Family history is significant for bilateral carotid endarterectomy in  her mother and unilateral endarterectomy in her sister.   PHYSICAL EXAMINATION:  A well-developed and well-nourished white female  appearing her stated age in no acute stress.  Blood pressure is 168/76,  pulse 85, respirations 18.  HEENT is normal.  Her carotid arteries are  without bruits bilaterally.  She has normal carotid pulsation  bilaterally.  She has 2+ radial pulses bilaterally.  Musculoskeletal  shows no major deformities or cyanosis.  Neurologic:  No focal weakness  or paresthesias.  Skin without ulcers or rashes.   She underwent repeat carotid duplex in our office today.  This revealed  moderate-to-severe right internal carotid stenosis with no significant  change since her last study.  She has no hemodynamically significant  narrowing in her left carotid system.   I again explain symptoms of carotid disease with patient.  She will  notify me should this occur, otherwise we will see her again in 6 months  with repeat carotid duplex at that time to rule out any progression of  her stenosis.     Larina Earthly, M.D.  Electronically Signed   TFE/MEDQ  D:  03/06/2010  T:  03/06/2010  Job:  [id number]

## 2010-08-01 NOTE — Telephone Encounter (Signed)
Spoke with patient.  Having colonoscopy done on 5/24 by Dr. Allena Katz.  Does have yearly OV scheduled for 6/28.

## 2010-08-01 NOTE — Telephone Encounter (Signed)
Yes, ok to stop ASA prior to surgery, then resume when ok per Surgery

## 2010-08-02 NOTE — Telephone Encounter (Signed)
No answer

## 2010-08-03 NOTE — Discharge Summary (Signed)
[place]  Patient:    [name], [name]                      MRN: [id number] Adm. Date:  [id number] Disc. Date: 09/19/99 Attending:  Veneda Melter Dictator:   Dian Queen, P.A.C. [facility] CC:         Weyman Pedro, M.D., [place], [place]                           Discharge Summary  HISTORY OF PRESENT ILLNESS:  Ms. Heeg is a 77 year old white married female with hypertension,  hyperlipoproteinemia, and subcritical coronary artery disease at angiography by Dr. Peter [place] in 1994.  The pain has become more of a problem, and she saw Dr. Jens Som on Aug 06, 1999, in the [place] office. Her pain then was somewhat atypical, and a stress Myoview was obtained.  This revealed no evidence of ischemia but, given her history and lack of explanation for such, we opted for relook angiography.  This was done by Dr. Chales Abrahams on the day of admission.  She was found to have a totally occluded proximal right coronary artery that he atherectomized and dilated to 20%.  She did have excellent left-to-right and right-to-right collaterals.  Her left coronary system was normal save for a chronic occlusion of marginal branch. Her ejection fraction was preserved at 65%.  She tolerated the procedure well, and her groin the following morning looked fine.  FINAL DIAGNOSES: 1. Coronary artery disease, totally occluded right coronary artery, treated    with rotational atherectomy and angioplasty to 20% without complication. 2. Preserved left ventricular function. 3. Hyperlipoproteinemia. 4. Controlled hypertension.  DISCHARGE MEDICATIONS:  Continue same medications.  FOLLOW-UP:  Follow up with Dr. Jens Som in the [place] office in one month and with Dr. Doyne Keel long-term. DD:  09/19/99 TD:  09/19/99 Job: [id number]

## 2010-08-03 NOTE — Assessment & Plan Note (Signed)
[facility]                          [name] CARDIOLOGY OFFICE NOTE   NAME:[name], [name]                    MRN:          [id number]  DATE:06/18/2006                            DOB:          May 19, 1929    PRIMARY CARE PHYSICIAN:  Dr. Eliberto Ivory.   CARDIOLOGIST:  [name], MD,FACC.   HISTORY OF PRESENT ILLNESS:  [name] is a 77 year old female patient  who was referred to [place] on May 27, 2006 with atypical chest pain and  recent abnormal stress test.  Her history is notable for cardiac  catheterization in 1994, revealing a totally occluded first obtuse  marginal branch.  In July of 2001, she was noted to have an abnormal  stress test, underwent cardiac catheterization that revealed a total  occlusion of the RCA.  This was treated with rotational atherectomy and  balloon angioplasty.  The patient had atypical chest pain and was set up  for a stress Myoview study.  This was notable for chest pain during  stress and 1-mm ST segment depression, leads V3-6.  The nuclear images  were normal, but Dr. Andee Lineman felt that the patient should be set up for  outpatient cardiac catheterization.  This was done on June 02, 2006.  This revealed an EF of 70%.  The left main was normal.  The LAD had a  50% proximal stenosis and was heavily calcified.  The circumflex gave  off two obtuse marginals.  The first obtuse marginal was totally  occluded at the ostium and collateralized from the right.  The second  marginal had 50% ostial stenosis.  The proximal RCA was heavily  calcified with 52% stenosis.  The distal stent in the distal vessel was  patent.  The patient returned to our office today for post-  catheterization followup.   The patient denies any chest pain or shortness of breath.  Denies any  syncope or near syncope.  Her right groin is stable.  She feels much  better now and has not had any recurrent chest pain for which she  initially presented.   CURRENT MEDICATIONS:  1. Vytorin 10/20 mg a day.  2. Tylenol 50 mg a day.  3. Diovan HCT 150/25 mg daily.  4. Aspirin 81 mg daily.  5. Calcium with vitamin D.   ALLERGIES:  SULFA.   PHYSICAL EXAMINATION:  GENERAL:  She is a well-nourished, well-  developed, female in no distress.  VITAL SIGNS:  Blood pressure is 145/75.  Repeat blood pressure by me  manually is 136/68 on the left, 132/68 on the right, weight 120.6  pounds.  HEENT:  Unremarkable.  NECK:  Without JVD.  CARDIAC:  Normal S1, S2, regular rate and rhythm without murmurs.  LUNGS:  Clear to auscultation bilaterally without wheezing, rhonchi or  rales.  ABDOMEN:  Soft, nontender with normoactive bowel sounds.  No  organomegaly.  EXTREMITIES:  No edema.  Calves are soft, nontender.  Right femoral  arteriotomy site soft without hematoma or bruits.   Electrocardiogram reveals a sinus rhythm with a heart rate of 71, no  acute changes.   IMPRESSION:  1. Coronary artery disease.      a.     Status post rotational atherectomy and balloon angioplasty       and stenting to the RCA in July of 2001.      b.     Non-obstructive coronary disease as outlined above and       patent stent in the distal RCA, by recent catheterization June 02, 2006 by Dr. Samule Ohm.  2. Preserved LV function with an EF of 70% and cardiac      catheterization.  3. Right carotid artery bruits.      a.     Recent carotid duplex scan revealing 50-69% right internal       carotid artery stenosis - recommendation for followup in one year.  4. Hypertension, fair control.  5. Hyperlipidemia.      a.     Followed by her primary care doctor, Dr. Eliberto Ivory.   PLAN:  The patient presents back to the office today for post-  catheterization followup.  Symptomatically, she is stable.  She had non-  obstructive disease and patent stent by catheterization.  Her first  obtuse marginal was occluded, and this was chronic and there was no  change.  Will continue her  current medical regimen.  She did try to take  Imdur, but it gave her a severe headache, and she stopped this.  I think  it would be okay for her to remain off of this.  Her blood pressure is  fairly controlled.  I have asked her to watch her salt intake.  She has  her lipids followed by Dr. Eliberto Ivory.  She should continue to have this  followed by him with a goal LDL of less than 70.  She will continue on  her aspirin therapy as well.  Will bring her back in followup in 6  months.  She will need followup Dopplers of her carotids in one year.      Tereso Newcomer, PA-C  Electronically Signed      Jonelle Sidle, MD  Electronically Signed   SW/MedQ  DD: 06/18/2006  DT: 06/18/2006  Job #: [id number]

## 2010-08-03 NOTE — Telephone Encounter (Signed)
Patient notified.  Will fax info below to Dr. Allena Katz.

## 2010-08-03 NOTE — Cardiovascular Report (Signed)
[place]  Patient:    [name], [name]                      MRN: [id number] Proc. Date: 09/18/99 Adm. Date:  [id number] Attending:  Veneda Melter CC:         Veneda Melter, M.D. [facility]             Madolyn Frieze. Jens Som, M.D. [facility]             Wende Crease, M.D.                        Cardiac Catheterization  PROCEDURES PERFORMED: 1. Left heart catheterization. 2. Left ventriculogram. 3. Selective coronary angiography. 4. Rotational atherectomy and angioplasty of the mid right coronary artery.  DIAGNOSES: 1. Coronary artery disease. 2. Normal left ventricular systolic function. 3. Mitral regurgitation 1+.  INDICATIONS:  Ms. Pearse is a 77 year old white female with a history of coronary artery disease, who had undergone catheterization in 1994.  At that time she had mild disease in the left coronary system with occlusion of a small first marginal branch and severe disease in the mid right coronary artery and well-preserved LV function and she was treated medically.  However, recently she has had progression of exertional chest pain occurring with movement.  The patient underwent a stress test recently showing marked ST depression in the inferolateral leads, although Myoview showed well perfused myocardium.  Due to persistence of symptoms, she presents for left heart catheterization.  TECHNIQUE:  After informed consent was obtained, the patient was brought to the cardiac catheterization lab where both groins were sterilely prepped and draped.  Lidocaine 1% was used to infiltrate the right groin, and a 6 French sheath placed into the right femoral artery using the modified Seldinger technique.  The 6 [place] and JR4 catheters were then used to engage the left and right coronary arteries, and selective angiography was performed in various projections using manual injections of contrast.  A 6 French pigtail catheter was advanced to the left ventricle  and a left ventriculogram performed using power injections of contrast.  FINDINGS:  Initial findings are as follows: 1. Left main trunk:  The left main trunk is a medium caliber vessel with    moderate stenosis at the proximal segment at 20-30%. 2. Left anterior descending:  This is a medium caliber vessel that provides    a small first diagonal branch and a larger second diagonal branch in the    mid section.  There is mild diffuse disease in the LAD system with    narrowings not greater than 20-30%. 3. Left circumflex artery:  This is a large caliber vessel that provides    two proximal marginal branches and two smaller distal marginal branches.    The first marginal branch is a small caliber vessel and was occluded in the    proximal segment.  The mid and distal section of the vessel was seen to    fill via collaterals from the left coronary artery.  The second marginal    branch is a large caliber vessel that has mild irregularities.  The    remainder of the circumflex has mild diffuse disease with narrowings of    20-30%. 4. Right coronary artery:  The right coronary artery is dominant.  This is    a small caliber vessel that provides the posterior descending artery    as well  as two posterior ventricular branches.  The right coronary artery    is moderately and diffusely diseased in the proximal mid section.  There    is then a long area of severe narrowing in the mid section between two    marginal branches of 95%, which is then occluded in the distal section of    the stenosis.  The distal RCA fills via collaterals from the right coronary    artery as well as from the left coronary artery.  The posterior descending    artery and posterior ventricular branches have mild diffuse disease.  LEFT VENTRICULOGRAM:  Normal end-systolic and end-diastolic dimensions. Overall left ventricular function is well-preserved with an ejection fraction of greater than 65%.  There is 1+ mitral  regurgitation.  LV pressure is 146/5, aortic is 146/65, LVEDP equals 15.  These findings were reviewed with the patient and we elected to probe the right coronary artery to see if this could be recanalized.  The 6 French sheath in the right femoral artery was exchanged over a wire for a 7 [place] sheath and a 7 [place] guide catheter with side holes used to engage the right coronary artery.  The patient was given 3000 units of heparin to maintain the ACT of greater than 250 seconds.  A 0.014 inch Hi-Torque Floppy wire was advanced within a 2.5 x 20 mm OpenSail balloon and used to probe the occlusion and stenosis.  With some effort we were finally able to cross the lesion and advance the balloon in the distal RCA.  The wire was then exchanged for a Rot[place] wire and the balloon removed.  At this point we made preparation for rotational atherectomy and a ReoPro bolus and infusion were initiated and a 5 French sheath placed in the right femoral vein.  Rotational atherectomy was then performed.  A 1.25 mm bur was introduced then three passes made at 160,000 RPMs for 20 seconds each.  Repeat angiography showed recanalization of the vessel with moderate residual disease and a 1.50 mm bur was introduced.  Again, three passes were made at 160 seconds for 20 seconds. Repeat angiography showed significant improvement in vessel lumen and TIMI-3 flow through the right coronary artery with some improvement in distal vessel lumen.  The 2.5 x 20 mm OpenSail balloon was then reintroduced and the [place] wire exchanged for a floppy wire.  Two inflations were performed in the mid right coronary artery at 4 atmospheres for 60 seconds.  Repeat angiography after the administration of intracoronary nitroglycerin showed an excellent result with less than 20-30% residual narrowing, no evidence of vessel damage and TIMI-3 flow throughout the right coronary artery.  Final angiography was then performed in various  projections showing moderate residual disease of not  greater than 30% and excellent flow through the RCA.  This was deemed acceptable.  The guide catheter was then removed and the sheath secured into position.  The patient was then transferred to the floor in stable condition where the sheath will be removed when the ACT returns to normal.  FINAL RESULTS:  Successful rotational atherectomy and angioplasty of the mid right coronary artery with reduction of 100% narrowing to 20%. DD:  09/18/99 TD:  09/19/99 Job: [id number]

## 2010-08-03 NOTE — Cardiovascular Report (Signed)
[name], [name]             ACCOUNT NO.:  [id number]   MEDICAL RECORD NO.:  [id number]          PATIENT TYPE:  OIB   LOCATION:  1965                         FACILITY:  [facility]   PHYSICIAN:  Salvadore Farber, MD  DATE OF BIRTH:  09-05-1929   DATE OF PROCEDURE:  06/02/2006  DATE OF DISCHARGE:                            CARDIAC CATHETERIZATION   PROCEDURE:  Left heart catheterization, left ventriculography, coronary  angiography.   INDICATIONS:  Ms. Hentges is a 77 year old lady with longstanding  coronary disease.  She is status-post stenting of totally occluded right  coronary artery in July 2001.  At that time, she was also noted to have  a total occlusion of a marginal which was well collateralized and has  been managed medically.  She has exertional dyspnea but no chest  discomfort.  She is referred for diagnostic angiography.   PROCEDURAL TECHNIQUE:  Informed consent was obtained.  Under 1%  lidocaine local anesthesia, a 4-French sheath was placed in the right  common femoral artery using modified Seldinger technique.  Diagnostic  angiography and ventriculography were performed using JL-4, 3-D RCA, and  pigtail catheters.  The patient tolerated procedure well as transferred  to holding room in stable condition.  Sheath removed there.   COMPLICATIONS:  None.   FINDINGS:  1. LV:  127/5/8.  EF 70% without regional wall motion abnormality.  2. No aortic stenosis or mitral regurgitation.  3. Left main:  Angiographically normal.  4. LAD:  Moderate-sized vessel giving rise to a single diagonal.  The      proximal vessel was heavily calcified and has what appears to be a      50% stenosis.  5. Circumflex:  Moderate-sized vessel giving rise to two marginals.      The first marginal is small caliber but long.  It is totally      occluded at its ostium and collateralized from the right.  This is      unchanged from 2001.  The second marginal has a 50% ostial      stenosis.  6.  RCA:  Small caliber, dominant vessel.  The proximal vessel was      heavily calcified with a 52% stenosis.  The stent in the distal      vessel was widely patent.   IMPRESSION/PLAN:  No change in her anatomy compared with 2001.  EF  remains normal.  Continue medical therapy.      Salvadore Farber, MD  Electronically Signed    WED/MEDQ  D:  06/02/2006  T:  06/02/2006  Job:  [id number]   cc:   [name], MD,FACC  Weyman Pedro

## 2010-08-03 NOTE — Assessment & Plan Note (Signed)
[facility]                          [facility] OFFICE NOTE   JASHAYLA, GLATFELTER                      MRN:          [id number]  DATE:05/27/2006                            DOB:          [birth year]    REFERRING PHYSICIAN:  Weyman Pedro   REASON FOR CONSULTATION:  Ms. Elman is a 77 year old female, with  known coronary artery disease, who has not been seen by [place] in followup  since at least 2001, and who is now referred to Dr. Andee Lineman for  evaluation of chest pain in the setting of recent abnormal stress test.   The patient herself states that she just recently established with Dr.  Weyman Pedro and that she was a former patient of Dr. Wende Crease.  At  the time of her evaluation, she did mention a singular episode of brief,  left sided chest pain which was quite sudden in onset and radiated from  the left axilla to under the left breast and into the xiphoid region.  This lasted only a few minutes, occurred at rest, and not associated  with any dyspnea, diaphoresis, or nausea.   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  She denies any  exertional chest discomfort, but clearly has exertional dyspnea and has  had this for some time.   The patient's cardiac history is notable for initial cardiac  catheterization in 1994, by Dr. Peter Swaziland, which revealed a totally  occluded 1st obtuse marginal branch, as well as severe disease of the  mid RCA and mild disease of the left coronary system, preserved left  ventricular function, and recommended medical therapy.  However, she  then presented to [place] in July 2001 following a stress test notable for  marked ST depression in the inferior lateral leads, albeit with no  perfusion defects.  Cardiac catheterization, by Dr. Veneda Melter,  however, revealed total occlusion of the mid RCA which was successfully  treated with  rotational atherectomy and balloon angioplasty.  The  residual anatomy was notable for nonobstructive LAD disease and known,  chronic occlusion of the small 1st obtuse marginal and filling of the  distal RCA via collaterals from the RCA and LAD.  Left ventricular  function was preserved with 1+ mitral regurgitation.   The current stress test, reviewed by Dr Andee Lineman, is notable for report of  chest pain, which patient described today as anterior chest tightness,  as well as 1 mm ST segment depression in leads V3 - V6, persisting for  nearly 4 minutes into recovery.  The patient herself did exercise nearly  6 1/2 minutes and achieved 84% of PMHR (7 METS).  Once again, perfusion  images revealed no abnormality, although Dr. Andee Lineman noted that this  finding did not exclude the possibility of underlying CAD.  Left  ventricular function was hyperdynamic (EF of 70%).   The patient is unable to recall the symptoms associated with her  previous percutaneous intervention, but did acknowledge that she had  anterior chest tightness during this recent stress test  which resolved  after a few minutes with rest.   Electrocardiogram today reveals NSR at 66 BPM with normal axis and small  Qs in the inferior lateral leads with no acute changes.   ALLERGIES:  SULFA   CURRENT MEDICATIONS:  1. Aspirin 81 daily.  2. Atenolol 50 daily.  3. Vytorin 10/20 daily.  4. Diovan HCT 160/25 daily.   PAST MEDICAL HISTORY:  1. Severe single vessel coronary artery disease.      a.     As outlined above.  2. Hypertension.  3. Hyperlipidemia.   SURGICAL HISTORY:  1. Ovarian cyst resection.  2. Removal of benign salivary gland tumor.   SOCIAL HISTORY:  The patient lives here in [place] with her grandson.  She  has never smoked tobacco on a regular basis and denies alcohol use.  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   FAMILY HISTORY:  Father deceased age 42, complications of cancer;   history of congestive heart failure and myocardial infarction.  Mother  deceased age 38, history of AAA repair and bilateral carotid  endarterectomy.   PHYSICAL EXAMINATION:  Blood pressure 152/77, pulse 72, regular.  Weight  126.8.  GENERAL:  A 77 year old female sitting upright, no apparent distress.  HEENT:  Normocephalic, atraumatic.  NECK:  Palpable bilateral carotid pulses with loud right carotid bruit;  no bruit on the left; no JVD.  LUNGS:  Clear to auscultation in all fields.  HEART:  Regular rate and rhythm (S1 and S2).  No murmurs, rubs or  gallops.  ABDOMEN:  Soft, nontender.  Intact bowel sounds.  No bruits; no  pulsatile epigastric mass.  EXTREMITIES:  Palpable bilateral femoral pulse with no bruits; intact  distal pulses without edema.  No focal deficit.   IMPRESSION:  1. Atypical chest pain.      a.     Recent abnormal exercise stress test, notable for chest pain       and EKG changes, though with no perfusion defects.  2. Severe single vessel coronary artery disease.      a.     Status post rotational atherectomy/balloon angioplasty 100%       occluded mid right coronary artery July 2001.      b.     Residual noncritical CAD with known, chronically occluded       small 1st obtuse marginal branch.  3. Hyperdynamic left ventricular function.  4. Right carotid bruit.  5. Hypertension.  6. Hyperlipidemia.  7. Exertional dyspnea.   PLAN:  1. Recommendation is to proceed with further evaluation with      diagnostic coronary angiography to be scheduled in the [facility] at      the next available date.  The patient is agreeable with this plan.      The risks/benefits have been discussed by Dr. Andee Lineman.  2. Schedule carotid Dopplers to exclude significant cerebrovascular      disease.  3. Add Imdur 30 mg q.d. to current medication regimen, as well as      prescription for nitroglycerin.      Gene Serpe, PA-C Electronically Signed      [name], MD,FACC   Electronically Signed   GS/MedQ  DD: 05/27/2006  DT: 05/29/2006  Job #: [id number]

## 2010-08-08 DIAGNOSIS — I2510 Atherosclerotic heart disease of native coronary artery without angina pectoris: Secondary | ICD-10-CM | POA: Insufficient documentation

## 2010-08-08 HISTORY — PX: CORONARY ARTERY BYPASS GRAFT: SHX141

## 2010-08-09 ENCOUNTER — Inpatient Hospital Stay (HOSPITAL_COMMUNITY): Payer: Medicare Other

## 2010-08-09 ENCOUNTER — Inpatient Hospital Stay (HOSPITAL_COMMUNITY)
Admission: EM | Admit: 2010-08-09 | Discharge: 2010-09-06 | DRG: 231 | Disposition: A | Discharge status: Skilled Nursing Facility | Payer: Medicare Other | Source: Ambulatory Visit | Attending: Thoracic Surgery (Cardiothoracic Vascular Surgery) | Admitting: Thoracic Surgery (Cardiothoracic Vascular Surgery)

## 2010-08-09 DIAGNOSIS — IMO0002 Reserved for concepts with insufficient information to code with codable children: Secondary | ICD-10-CM

## 2010-08-09 DIAGNOSIS — R570 Cardiogenic shock: Secondary | ICD-10-CM | POA: Diagnosis present

## 2010-08-09 DIAGNOSIS — I2582 Chronic total occlusion of coronary artery: Secondary | ICD-10-CM | POA: Diagnosis present

## 2010-08-09 DIAGNOSIS — D696 Thrombocytopenia, unspecified: Secondary | ICD-10-CM | POA: Diagnosis not present

## 2010-08-09 DIAGNOSIS — I2510 Atherosclerotic heart disease of native coronary artery without angina pectoris: Secondary | ICD-10-CM | POA: Diagnosis present

## 2010-08-09 DIAGNOSIS — R197 Diarrhea, unspecified: Secondary | ICD-10-CM | POA: Diagnosis present

## 2010-08-09 DIAGNOSIS — I472 Ventricular tachycardia, unspecified: Secondary | ICD-10-CM | POA: Diagnosis present

## 2010-08-09 DIAGNOSIS — D689 Coagulation defect, unspecified: Secondary | ICD-10-CM | POA: Diagnosis not present

## 2010-08-09 DIAGNOSIS — I4891 Unspecified atrial fibrillation: Secondary | ICD-10-CM | POA: Diagnosis not present

## 2010-08-09 DIAGNOSIS — E8779 Other fluid overload: Secondary | ICD-10-CM | POA: Diagnosis not present

## 2010-08-09 DIAGNOSIS — J90 Pleural effusion, not elsewhere classified: Secondary | ICD-10-CM | POA: Diagnosis not present

## 2010-08-09 DIAGNOSIS — M810 Age-related osteoporosis without current pathological fracture: Secondary | ICD-10-CM | POA: Diagnosis present

## 2010-08-09 DIAGNOSIS — Z7982 Long term (current) use of aspirin: Secondary | ICD-10-CM

## 2010-08-09 DIAGNOSIS — I509 Heart failure, unspecified: Secondary | ICD-10-CM | POA: Diagnosis not present

## 2010-08-09 DIAGNOSIS — J189 Pneumonia, unspecified organism: Secondary | ICD-10-CM | POA: Diagnosis not present

## 2010-08-09 DIAGNOSIS — Z882 Allergy status to sulfonamides status: Secondary | ICD-10-CM

## 2010-08-09 DIAGNOSIS — Z87891 Personal history of nicotine dependence: Secondary | ICD-10-CM

## 2010-08-09 DIAGNOSIS — I4510 Unspecified right bundle-branch block: Secondary | ICD-10-CM | POA: Diagnosis present

## 2010-08-09 DIAGNOSIS — J9600 Acute respiratory failure, unspecified whether with hypoxia or hypercapnia: Secondary | ICD-10-CM | POA: Diagnosis present

## 2010-08-09 DIAGNOSIS — L8992 Pressure ulcer of unspecified site, stage 2: Secondary | ICD-10-CM | POA: Diagnosis not present

## 2010-08-09 DIAGNOSIS — L89309 Pressure ulcer of unspecified buttock, unspecified stage: Secondary | ICD-10-CM | POA: Diagnosis not present

## 2010-08-09 DIAGNOSIS — I4729 Other ventricular tachycardia: Secondary | ICD-10-CM | POA: Diagnosis present

## 2010-08-09 DIAGNOSIS — T6701XA Heatstroke and sunstroke, initial encounter: Secondary | ICD-10-CM | POA: Diagnosis present

## 2010-08-09 DIAGNOSIS — Y849 Medical procedure, unspecified as the cause of abnormal reaction of the patient, or of later complication, without mention of misadventure at the time of the procedure: Secondary | ICD-10-CM | POA: Diagnosis present

## 2010-08-09 DIAGNOSIS — N289 Disorder of kidney and ureter, unspecified: Secondary | ICD-10-CM | POA: Diagnosis not present

## 2010-08-09 DIAGNOSIS — I10 Essential (primary) hypertension: Secondary | ICD-10-CM | POA: Diagnosis present

## 2010-08-09 DIAGNOSIS — D62 Acute posthemorrhagic anemia: Secondary | ICD-10-CM | POA: Diagnosis not present

## 2010-08-09 DIAGNOSIS — I6529 Occlusion and stenosis of unspecified carotid artery: Secondary | ICD-10-CM | POA: Diagnosis present

## 2010-08-09 DIAGNOSIS — I214 Non-ST elevation (NSTEMI) myocardial infarction: Principal | ICD-10-CM | POA: Diagnosis present

## 2010-08-09 DIAGNOSIS — N179 Acute kidney failure, unspecified: Secondary | ICD-10-CM | POA: Diagnosis not present

## 2010-08-09 DIAGNOSIS — R5381 Other malaise: Secondary | ICD-10-CM | POA: Diagnosis present

## 2010-08-09 DIAGNOSIS — I059 Rheumatic mitral valve disease, unspecified: Secondary | ICD-10-CM | POA: Diagnosis present

## 2010-08-09 DIAGNOSIS — K7200 Acute and subacute hepatic failure without coma: Secondary | ICD-10-CM | POA: Diagnosis not present

## 2010-08-09 DIAGNOSIS — E785 Hyperlipidemia, unspecified: Secondary | ICD-10-CM | POA: Diagnosis present

## 2010-08-09 DIAGNOSIS — T82897A Other specified complication of cardiac prosthetic devices, implants and grafts, initial encounter: Secondary | ICD-10-CM | POA: Diagnosis present

## 2010-08-09 DIAGNOSIS — I672 Cerebral atherosclerosis: Secondary | ICD-10-CM | POA: Diagnosis present

## 2010-08-09 DIAGNOSIS — R079 Chest pain, unspecified: Secondary | ICD-10-CM

## 2010-08-09 HISTORY — PX: OTHER SURGICAL HISTORY: SHX169

## 2010-08-09 LAB — HEMOGLOBIN A1C: Mean Plasma Glucose: 137 mg/dL — ABNORMAL HIGH (ref <117)

## 2010-08-09 LAB — COMPREHENSIVE METABOLIC PANEL
Albumin: 2.5 g/dL — ABNORMAL LOW (ref 3.5–5.2)
BUN: 21 mg/dL (ref 6–23)
Calcium: 7.7 mg/dL — ABNORMAL LOW (ref 8.4–10.5)
Glucose, Bld: 244 mg/dL — ABNORMAL HIGH (ref 70–99)
Total Protein: 5.4 g/dL — ABNORMAL LOW (ref 6.0–8.3)

## 2010-08-09 LAB — POCT I-STAT 3, ART BLOOD GAS (G3+)
Bicarbonate: 14.6 mEq/L — ABNORMAL LOW (ref 20.0–24.0)
Bicarbonate: 21.9 mEq/L (ref 20.0–24.0)
O2 Saturation: 74 %
Patient temperature: 35.1
pCO2 arterial: 41 mmHg (ref 35.0–45.0)
pCO2 arterial: 45.1 mmHg — ABNORMAL HIGH (ref 35.0–45.0)
pH, Arterial: 7.118 — CL (ref 7.350–7.400)
pH, Arterial: 7.217 — ABNORMAL LOW (ref 7.350–7.400)
pH, Arterial: 7.278 — ABNORMAL LOW (ref 7.350–7.400)
pO2, Arterial: 71 mmHg — ABNORMAL LOW (ref 80.0–100.0)

## 2010-08-09 LAB — CBC
HCT: 27.3 % — ABNORMAL LOW (ref 36.0–46.0)
HCT: 28.6 % — ABNORMAL LOW (ref 36.0–46.0)
Hemoglobin: 11.2 g/dL — ABNORMAL LOW (ref 12.0–15.0)
MCH: 30.5 pg (ref 26.0–34.0)
MCHC: 35.3 g/dL (ref 30.0–36.0)
MCV: 85.8 fL (ref 78.0–100.0)
MCV: 86.4 fL (ref 78.0–100.0)
Platelets: 181 10*3/uL (ref 150–400)
Platelets: 199 10*3/uL (ref 150–400)
RBC: 3.18 MIL/uL — ABNORMAL LOW (ref 3.87–5.11)
RBC: 3.78 MIL/uL — ABNORMAL LOW (ref 3.87–5.11)
RDW: 15 % (ref 11.5–15.5)
WBC: 16.7 10*3/uL — ABNORMAL HIGH (ref 4.0–10.5)

## 2010-08-09 LAB — POCT ACTIVATED CLOTTING TIME: Activated Clotting Time: 293 seconds

## 2010-08-09 LAB — CARDIAC PANEL(CRET KIN+CKTOT+MB+TROPI)
CK, MB: 32.5 ng/mL (ref 0.3–4.0)
Relative Index: 4.2 — ABNORMAL HIGH (ref 0.0–2.5)
Total CK: 774 U/L — ABNORMAL HIGH (ref 7–177)
Troponin I: 8.22 ng/mL (ref <0.30)

## 2010-08-09 LAB — POCT I-STAT, CHEM 8
HCT: 37 % (ref 36.0–46.0)
Hemoglobin: 12.6 g/dL (ref 12.0–15.0)
Sodium: 139 mEq/L (ref 135–145)
TCO2: 17 mmol/L (ref 0–100)

## 2010-08-09 LAB — PROTIME-INR
INR: 1.32 (ref 0.00–1.49)
Prothrombin Time: 16.6 seconds — ABNORMAL HIGH (ref 11.6–15.2)

## 2010-08-09 LAB — ABO/RH: ABO/RH(D): A POS

## 2010-08-09 LAB — POCT I-STAT 4, (NA,K, GLUC, HGB,HCT)
HCT: 28 % — ABNORMAL LOW (ref 36.0–46.0)
Sodium: 147 mEq/L — ABNORMAL HIGH (ref 135–145)

## 2010-08-09 LAB — HEMOGLOBIN AND HEMATOCRIT, BLOOD
HCT: 26.4 % — ABNORMAL LOW (ref 36.0–46.0)
Hemoglobin: 9.3 g/dL — ABNORMAL LOW (ref 12.0–15.0)

## 2010-08-10 ENCOUNTER — Inpatient Hospital Stay (HOSPITAL_COMMUNITY): Payer: Medicare Other

## 2010-08-10 LAB — PREPARE FRESH FROZEN PLASMA
Unit division: 0
Unit division: 0

## 2010-08-10 LAB — POCT I-STAT 3, ART BLOOD GAS (G3+)
Acid-base deficit: 3 mmol/L — ABNORMAL HIGH (ref 0.0–2.0)
Acid-base deficit: 6 mmol/L — ABNORMAL HIGH (ref 0.0–2.0)
Bicarbonate: 22.6 mEq/L (ref 20.0–24.0)
O2 Saturation: 98 %
Patient temperature: 35
TCO2: 22 mmol/L (ref 0–100)
pCO2 arterial: 39.6 mmHg (ref 35.0–45.0)
pH, Arterial: 7.307 — ABNORMAL LOW (ref 7.350–7.400)
pH, Arterial: 7.35 (ref 7.350–7.400)
pH, Arterial: 7.369 (ref 7.350–7.400)
pO2, Arterial: 111 mmHg — ABNORMAL HIGH (ref 80.0–100.0)
pO2, Arterial: 50 mmHg — ABNORMAL LOW (ref 80.0–100.0)

## 2010-08-10 LAB — CBC
HCT: 25.9 % — ABNORMAL LOW (ref 36.0–46.0)
Hemoglobin: 6.2 g/dL — CL (ref 12.0–15.0)
Hemoglobin: 10.6 g/dL — ABNORMAL LOW (ref 12.0–15.0)
MCH: 30.8 pg (ref 26.0–34.0)
MCH: 29 pg (ref 26.0–34.0)
MCHC: 34.7 g/dL (ref 30.0–36.0)
MCV: 84.9 fL (ref 78.0–100.0)
MCV: 80.6 fL (ref 78.0–100.0)
Platelets: 140 10*3/uL — ABNORMAL LOW (ref 150–400)
Platelets: 78 10*3/uL — ABNORMAL LOW (ref 150–400)
Platelets: 83 10*3/uL — ABNORMAL LOW (ref 150–400)
RBC: 2.01 MIL/uL — ABNORMAL LOW (ref 3.87–5.11)
RBC: 2.39 MIL/uL — ABNORMAL LOW (ref 3.87–5.11)
RBC: 3.66 MIL/uL — ABNORMAL LOW (ref 3.87–5.11)
RDW: 15.1 % (ref 11.5–15.5)
WBC: 4.4 10*3/uL (ref 4.0–10.5)
WBC: 5.5 10*3/uL (ref 4.0–10.5)
WBC: 6 10*3/uL (ref 4.0–10.5)
WBC: 8.1 10*3/uL (ref 4.0–10.5)

## 2010-08-10 LAB — DIC (DISSEMINATED INTRAVASCULAR COAGULATION)PANEL
Fibrinogen: 245 mg/dL (ref 204–475)
Platelets: 85 10*3/uL — ABNORMAL LOW (ref 150–400)
Smear Review: NONE SEEN
aPTT: 37 seconds (ref 24–37)

## 2010-08-10 LAB — BASIC METABOLIC PANEL
BUN: 16 mg/dL (ref 6–23)
CO2: 23 mEq/L (ref 19–32)
Calcium: 10.6 mg/dL — ABNORMAL HIGH (ref 8.4–10.5)
Chloride: 105 mEq/L (ref 96–112)
GFR calc Af Amer: 44 mL/min — ABNORMAL LOW (ref >60)
GFR calc non Af Amer: 36 mL/min — ABNORMAL LOW (ref >60)
Glucose, Bld: 158 mg/dL — ABNORMAL HIGH (ref 70–99)
Potassium: 2.7 mEq/L — CL (ref 3.5–5.1)
Potassium: 3.9 mEq/L (ref 3.5–5.1)
Sodium: 147 mEq/L — ABNORMAL HIGH (ref 135–145)

## 2010-08-10 LAB — MAGNESIUM
Magnesium: 2.8 mg/dL — ABNORMAL HIGH (ref 1.5–2.5)
Magnesium: 2.1 mg/dL (ref 1.5–2.5)

## 2010-08-10 LAB — CREATININE, SERUM
Creatinine, Ser: 1.58 mg/dL — ABNORMAL HIGH (ref 0.4–1.2)
GFR calc Af Amer: 38 mL/min — ABNORMAL LOW (ref >60)
GFR calc non Af Amer: 32 mL/min — ABNORMAL LOW (ref >60)

## 2010-08-10 LAB — GLUCOSE, CAPILLARY
Glucose-Capillary: 130 mg/dL — ABNORMAL HIGH (ref 70–99)
Glucose-Capillary: 135 mg/dL — ABNORMAL HIGH (ref 70–99)
Glucose-Capillary: 149 mg/dL — ABNORMAL HIGH (ref 70–99)
Glucose-Capillary: 155 mg/dL — ABNORMAL HIGH (ref 70–99)
Glucose-Capillary: 183 mg/dL — ABNORMAL HIGH (ref 70–99)
Glucose-Capillary: 196 mg/dL — ABNORMAL HIGH (ref 70–99)
Glucose-Capillary: 199 mg/dL — ABNORMAL HIGH (ref 70–99)
Glucose-Capillary: 216 mg/dL — ABNORMAL HIGH (ref 70–99)

## 2010-08-10 LAB — PREPARE PLATELET PHERESIS: Unit division: 0

## 2010-08-10 LAB — PROTIME-INR
INR: 1.53 — ABNORMAL HIGH (ref 0.00–1.49)
Prothrombin Time: 18 seconds — ABNORMAL HIGH (ref 11.6–15.2)
Prothrombin Time: 18.6 seconds — ABNORMAL HIGH (ref 11.6–15.2)

## 2010-08-10 LAB — POCT I-STAT, CHEM 8
Calcium, Ion: 1.28 mmol/L (ref 1.12–1.32)
Creatinine, Ser: 1.7 mg/dL — ABNORMAL HIGH (ref 0.4–1.2)
Hemoglobin: 6.1 g/dL — CL (ref 12.0–15.0)
Sodium: 146 mEq/L — ABNORMAL HIGH (ref 135–145)
TCO2: 23 mmol/L (ref 0–100)

## 2010-08-10 LAB — APTT
aPTT: 37 seconds (ref 24–37)
aPTT: 44 seconds — ABNORMAL HIGH (ref 24–37)

## 2010-08-10 NOTE — Op Note (Signed)
NAMEMARIAM, [name]             ACCOUNT NO.:  [id number]  MEDICAL RECORD NO.:  [id number]           PATIENT TYPE:  I  LOCATION:  2305                         FACILITY:  [facility]  PHYSICIAN:  Salvatore Decent. [name], M.D. DATE OF BIRTH:  October 11, 1929  DATE OF PROCEDURE:  08/09/2010 DATE OF DISCHARGE:                              OPERATIVE REPORT   PREOPERATIVE DIAGNOSES:  Acute ST-segment elevation myocardial infarction with acute stent thrombosis and critical left main coronary artery disease, cardiogenic shock, pulmonary edema, respiratory failure, preoperative intra-aortic balloon pump.  POSTOPERATIVE DIAGNOSES:  Acute ST-segment elevation myocardial infarction with acute stent thrombosis and critical left main coronary artery disease, cardiogenic shock, pulmonary edema, respiratory failure, preoperative intra-aortic balloon pump.  PROCEDURE:  Emergency median sternotomy for coronary artery bypass grafting x3 using saphenous vein graft to left anterior descending coronary artery, saphenous vein graft to obtuse marginal branch of the left circumflex coronary artery, saphenous vein graft to distal right coronary artery, endoscopic saphenous vein harvest from right thigh and right lower leg.  SURGEON:  Salvatore Decent. [name], MD  ASSISTANT:  Rowe Clack, PA-C  ANESTHESIOLOGISTS: 1. Sheldon Silvan, MD 2. Quita Skye Krista Blue, MD  BRIEF CLINICAL NOTE:  The patient is a 77 year old widowed white female from [place], [place], who has known history of coronary artery disease and undergone multivessel percutaneous coronary interventions in the past.  She had drug-eluting stent placed in the proximal left anterior descending coronary artery in 2009.  The patient had been taken off Plavix earlier this year.  Aspirin was additionally stopped prior to colonoscopy performed this morning at [facility] in [place].  Following colonoscopy, the patient developed acute ST-segment elevation  myocardial infarction and was transported to [facility] where she underwent emergent cardiac catheterization.  She was found to be in cardiogenic shock with acute pulmonary edema, acute respiratory distress requiring intubation.  Intra-aortic balloon pump was placed.  Cardiac catheterization revealed critical left main stenosis with acute occlusion of the left anterior descending coronary artery and clot extending into the left main.  There was three-vessel coronary artery disease.  Guidewires were placed down the left anterior descending coronary artery and the circumflex with restoration of a minimal amount of antegrade flow.  Emergency surgical consultation was requested.  The patient was brought directly to the operating room for emergency salvage surgical revascularization.  OPERATIVE FINDINGS: 1. Very small-caliber distal coronary arteries. 2. Good-quality saphenous vein conduit for grafting. 3. Improved left ventricular function following surgical     revascularization. 4. Diffuse coagulopathy following separation from bypass. 5. Severe hypoxemia and pulmonary edema with elevated airway pressures     precluding the ability to proceed with routine primary closure of     the sternum, thereby requiring temporary Esmarch closure.  OPERATIVE NOTE IN DETAIL:  The patient was brought directly from the cardiac cath lab to the operating room on the late afternoon of Aug 09, 2010.  The patient was placed in the supine position on the operating table.  Adequate anesthesia was monitored and verified under the care and direction of Dr. Sheldon Silvan.  A Swan-Ganz catheter is placed through the  right internal jugular approach.  Existing sheath in the right groin was monitored for continuous blood pressure as is the anterior balloon pump which was placed via left femoral artery approach. The patient's anterior chest, abdomen, both groins, and both lower extremities were prepared and draped in  sterile manner.  Baseline transesophageal echocardiogram was performed by Dr. Ivin Booty.  This demonstrates akinesis of the entire distal anterior wall and apex.  The inferior wall and lateral wall were moving well.  The heart appears somewhat underfilled.  There was mild mitral regurgitation.  Median sternotomy incision was performed.  The pericardium was opened. Heparin was administered.  The ascending aorta was rapidly cannulated for cardiopulmonary bypass.  A venous cannula was placed through the right atrium and cardiopulmonary bypass was rapidly was instituted after verifying adequate heparinization.  The patient was on full cardiopulmonary bypass support by 5:53 in the evening.  During this period of time, the greater saphenous vein was simultaneously removed from the patient's right thigh and the upper portion of the right lower leg using endoscopic vein harvest technique through a small incision placed just below the right knee.  The saphenous vein was fair to good quality conduit.  After the saphenous vein has been removed, a small incision in the right lower extremity was closed with absorbable suture.  A retrograde cardioplegic cannula was placed through the right atrium into the coronary sinus.  Antegrade cardioplegic cannula was placed directly in the ascending aorta.  The aortic cross-clamp was applied and cold blood cardioplegia was delivered initially in antegrade fashion through the aortic root.  Iced saline slush was applied for topical hypothermia.  Supplemental cardioplegia was administered retrograde through the coronary sinus catheter.  Repeat doses of cardioplegia were administered intermittently throughout the entire cross-clamp portion of the operation antegrade through the subsequently placed vein grafts and retrograde through the coronary sinus catheter to maintain completely flat electrocardiogram and septal myocardial temperature below 15 degrees centigrade  temperature.  The following distal coronary anastomoses were performed: 1. The distal right coronary artery was grafted with saphenous vein     graft in an end-to-side fashion.  This vessel was grafted just at     the bifurcation of the posterior descending coronary artery.  At     the site of distal bypass, it measured 1.3 mm and is a fair-quality     target vessel. 2. The first obtuse marginal branch of the left circumflex coronary     artery was grafted with a saphenous vein graft in an end-to-side     fashion.  This vessel measured 1.0 mm in diameter and was fair-to-     poor quality target vessel for grafting. 3. The distal left anterior descending coronary artery was grafted     with a saphenous vein graft in an end-to-side fashion.  This vessel     measured 1.3 mm in diameter and was a fair-to-good quality target     vessel for grafting.  Three proximal saphenous vein grafts were placed directly to the ascending aorta prior to removal of the aortic cross-clamp.  One final dose of warm retrograde hot shot cardioplegia was administered.  The aortic cross-clamp was removed after total cross-clamp time of 71 minutes.  After removal of the cross-clamp, there appears to be an injury from the cross-clamp to the right pulmonary artery.  This was closed with pledgeted Prolene sutures.  The patient was rewarmed to 37 degrees centigrade temperature. Epicardial pacing wires were fixed to right ventricular free  wall into the right atrium.  Initially, a brief attempt at weaning from bypass was performed with the patient with the intra-aortic balloon pump that one- to-one and the patient on dopamine at 3 mcg/kg per minute.  However, there was poor left ventricular function as cardiopulmonary bypass was continued and the patient was rested on cardiopulmonary bypass for an additional 30 minutes.  Milrinone and epinephrine infusions were begun. The patient was subsequently weaned from  cardiopulmonary bypass without difficulty.  The patient's rhythm at separation from bypass was normal sinus rhythm.  The patient was weaned from bypass on milrinone at 0.3 mcg/kg per minute, epinephrine at 2 mcg per minute, and intra-aortic balloon pump at 1:1.  Total cardiopulmonary bypass time for the operation was 163 minutes.  The venous and arterial cannulae were removed.  Protamine was administered to reverse the anticoagulation.  The mediastinum was irrigated with saline solution.  There was diffuse coagulopathy.  The patient was kept in the operating room and transfused a total of 4 units fresh frozen plasma and three packs of platelets for thrombocytopenia and coagulopathy after separation from bypass.  The patient remained hemodynamically stable.  However, there was difficulty with oxygenation due to severe hypoxemia with severe pulmonary edema with hemorrhagic pulmonary edema fluid emanating from the endotracheal tube.  The airway pressures were high.  Subsequently, an attempt at conventional closure of the sternotomy was impossible as every time the sternum was pulled together the patient's airway pressures go up and oxygen saturations dip down into the 60s.  The mediastinum and the left and right pleural spaces were drained with a combination of five chest tubes placed through separate stab incisions.  The sternotomy was now closed with a temporary Esmarch closure.  The chest tubes were fixed to closed suction drainage device and a wound VAC was placed over the Esmarch closure.  The patient was transported directly to the surgical intensive care unit in critical condition.  Two laparotomy sponge pads were left packed within the mediastinum beneath the Esmarch closure.  Otherwise, all sponge, instrument, and needle counts remained correct at completion of the operation.  There were no intraoperative complications.     Salvatore Decent. [name], M.D.     CHO/MEDQ  D:   08/09/2010  T:  08/10/2010  Job:  [id number]  cc:   Veverly Fells. Excell Seltzer, MD Peter M. Swaziland, M.D. [name], MD,FACC Kirstie Peri, MD  Electronically Signed by Tressie Stalker M.D. on 08/10/2010 07:05:44 AM

## 2010-08-11 ENCOUNTER — Inpatient Hospital Stay (HOSPITAL_COMMUNITY): Payer: Medicare Other

## 2010-08-11 LAB — PREPARE FRESH FROZEN PLASMA
Unit division: 0
Unit division: 0

## 2010-08-11 LAB — PREPARE PLATELET PHERESIS: Unit division: 0

## 2010-08-11 LAB — GLUCOSE, CAPILLARY
Glucose-Capillary: 103 mg/dL — ABNORMAL HIGH (ref 70–99)
Glucose-Capillary: 120 mg/dL — ABNORMAL HIGH (ref 70–99)
Glucose-Capillary: 120 mg/dL — ABNORMAL HIGH (ref 70–99)
Glucose-Capillary: 140 mg/dL — ABNORMAL HIGH (ref 70–99)
Glucose-Capillary: 92 mg/dL (ref 70–99)
Glucose-Capillary: 103 mg/dL — ABNORMAL HIGH (ref 70–99)
Glucose-Capillary: 115 mg/dL — ABNORMAL HIGH (ref 70–99)
Glucose-Capillary: 128 mg/dL — ABNORMAL HIGH (ref 70–99)
Glucose-Capillary: 53 mg/dL — ABNORMAL LOW (ref 70–99)
Glucose-Capillary: 85 mg/dL (ref 70–99)
Glucose-Capillary: 89 mg/dL (ref 70–99)
Glucose-Capillary: 94 mg/dL (ref 70–99)
Glucose-Capillary: 82 mg/dL (ref 70–99)
Glucose-Capillary: 99 mg/dL (ref 70–99)
Glucose-Capillary: 102 mg/dL — ABNORMAL HIGH (ref 70–99)

## 2010-08-11 LAB — COMPREHENSIVE METABOLIC PANEL
ALT: 1012 U/L — ABNORMAL HIGH (ref 0–35)
Albumin: 3.1 g/dL — ABNORMAL LOW (ref 3.5–5.2)
Alkaline Phosphatase: 32 U/L — ABNORMAL LOW (ref 39–117)
BUN: 18 mg/dL (ref 6–23)
Chloride: 104 mEq/L (ref 96–112)
Glucose, Bld: 107 mg/dL — ABNORMAL HIGH (ref 70–99)
Potassium: 3.8 mEq/L (ref 3.5–5.1)
Sodium: 143 mEq/L (ref 135–145)
Total Bilirubin: 3.1 mg/dL — ABNORMAL HIGH (ref 0.3–1.2)
Total Protein: 4.8 g/dL — ABNORMAL LOW (ref 6.0–8.3)

## 2010-08-11 LAB — PREPARE CRYOPRECIPITATE: Unit division: 0

## 2010-08-11 LAB — CBC
HCT: 27.6 % — ABNORMAL LOW (ref 36.0–46.0)
HCT: 21.6 % — ABNORMAL LOW (ref 36.0–46.0)
Hemoglobin: 10 g/dL — ABNORMAL LOW (ref 12.0–15.0)
MCHC: 36.2 g/dL — ABNORMAL HIGH (ref 30.0–36.0)
MCV: 78.6 fL (ref 78.0–100.0)
MCV: 79.1 fL (ref 78.0–100.0)
Platelets: 52 10*3/uL — ABNORMAL LOW (ref 150–400)
RBC: 2.73 MIL/uL — ABNORMAL LOW (ref 3.87–5.11)
RDW: 15.7 % — ABNORMAL HIGH (ref 11.5–15.5)
WBC: 7.3 10*3/uL (ref 4.0–10.5)

## 2010-08-11 LAB — POCT I-STAT 3, ART BLOOD GAS (G3+)
Bicarbonate: 27.2 mEq/L — ABNORMAL HIGH (ref 20.0–24.0)
Patient temperature: 37.3
pH, Arterial: 7.482 — ABNORMAL HIGH (ref 7.350–7.400)

## 2010-08-11 LAB — APTT: aPTT: 36 seconds (ref 24–37)

## 2010-08-11 LAB — BASIC METABOLIC PANEL
BUN: 19 mg/dL (ref 6–23)
Chloride: 101 mEq/L (ref 96–112)
Glucose, Bld: 86 mg/dL (ref 70–99)
Potassium: 3.3 mEq/L — ABNORMAL LOW (ref 3.5–5.1)

## 2010-08-12 ENCOUNTER — Inpatient Hospital Stay (HOSPITAL_COMMUNITY): Payer: Medicare Other

## 2010-08-12 DIAGNOSIS — S271XXA Traumatic hemothorax, initial encounter: Secondary | ICD-10-CM

## 2010-08-12 LAB — CROSSMATCH
ABO/RH(D): A POS
Antibody Screen: NEGATIVE
Unit division: 0
Unit division: 0
Unit division: 0
Unit division: 0
Unit division: 0
Unit division: 0
Unit division: 0
Unit division: 0
Unit division: 0
Unit division: 0
Unit division: 0

## 2010-08-12 LAB — CBC
HCT: 29 % — ABNORMAL LOW (ref 36.0–46.0)
HCT: 30.7 % — ABNORMAL LOW (ref 36.0–46.0)
Hemoglobin: 10.5 g/dL — ABNORMAL LOW (ref 12.0–15.0)
MCH: 29.1 pg (ref 26.0–34.0)
MCHC: 36.2 g/dL — ABNORMAL HIGH (ref 30.0–36.0)
MCHC: 35.8 g/dL (ref 30.0–36.0)
MCV: 80.3 fL (ref 78.0–100.0)
MCV: 82.1 fL (ref 78.0–100.0)
RDW: 15.7 % — ABNORMAL HIGH (ref 11.5–15.5)

## 2010-08-12 LAB — PREPARE FRESH FROZEN PLASMA: Unit division: 0

## 2010-08-12 LAB — BASIC METABOLIC PANEL
BUN: 23 mg/dL (ref 6–23)
CO2: 28 mEq/L (ref 19–32)
Chloride: 96 mEq/L (ref 96–112)
Creatinine, Ser: 1.99 mg/dL — ABNORMAL HIGH (ref 0.4–1.2)
Glucose, Bld: 139 mg/dL — ABNORMAL HIGH (ref 70–99)

## 2010-08-12 LAB — COMPREHENSIVE METABOLIC PANEL
ALT: 847 U/L — ABNORMAL HIGH (ref 0–35)
Alkaline Phosphatase: 49 U/L (ref 39–117)
Chloride: 98 mEq/L (ref 96–112)
Glucose, Bld: 93 mg/dL (ref 70–99)
Potassium: 3.5 mEq/L (ref 3.5–5.1)
Sodium: 138 mEq/L (ref 135–145)
Total Bilirubin: 3.9 mg/dL — ABNORMAL HIGH (ref 0.3–1.2)
Total Protein: 5 g/dL — ABNORMAL LOW (ref 6.0–8.3)

## 2010-08-12 LAB — GLUCOSE, CAPILLARY
Glucose-Capillary: 112 mg/dL — ABNORMAL HIGH (ref 70–99)
Glucose-Capillary: 102 mg/dL — ABNORMAL HIGH (ref 70–99)
Glucose-Capillary: 91 mg/dL (ref 70–99)
Glucose-Capillary: 102 mg/dL — ABNORMAL HIGH (ref 70–99)
Glucose-Capillary: 98 mg/dL (ref 70–99)
Glucose-Capillary: 85 mg/dL (ref 70–99)
Glucose-Capillary: 113 mg/dL — ABNORMAL HIGH (ref 70–99)

## 2010-08-12 LAB — POCT I-STAT 3, ART BLOOD GAS (G3+)
Acid-Base Excess: 8 mmol/L — ABNORMAL HIGH (ref 0.0–2.0)
Bicarbonate: 29.8 mEq/L — ABNORMAL HIGH (ref 20.0–24.0)
O2 Saturation: 98 %
Patient temperature: 37.8
TCO2: 31 mmol/L (ref 0–100)
pO2, Arterial: 92 mmHg (ref 80.0–100.0)

## 2010-08-12 LAB — PROTIME-INR: Prothrombin Time: 17.5 seconds — ABNORMAL HIGH (ref 11.6–15.2)

## 2010-08-12 LAB — PRO B NATRIURETIC PEPTIDE: Pro B Natriuretic peptide (BNP): 32858 pg/mL — ABNORMAL HIGH (ref 0–450)

## 2010-08-12 LAB — APTT: aPTT: 36 seconds (ref 24–37)

## 2010-08-13 ENCOUNTER — Inpatient Hospital Stay (HOSPITAL_COMMUNITY): Payer: Medicare Other

## 2010-08-13 HISTORY — PX: OTHER SURGICAL HISTORY: SHX169

## 2010-08-13 LAB — GLUCOSE, CAPILLARY
Glucose-Capillary: 101 mg/dL — ABNORMAL HIGH (ref 70–99)
Glucose-Capillary: 125 mg/dL — ABNORMAL HIGH (ref 70–99)
Glucose-Capillary: 88 mg/dL (ref 70–99)
Glucose-Capillary: 138 mg/dL — ABNORMAL HIGH (ref 70–99)
Glucose-Capillary: 148 mg/dL — ABNORMAL HIGH (ref 70–99)
Glucose-Capillary: 110 mg/dL — ABNORMAL HIGH (ref 70–99)
Glucose-Capillary: 110 mg/dL — ABNORMAL HIGH (ref 70–99)

## 2010-08-13 LAB — POCT I-STAT 4, (NA,K, GLUC, HGB,HCT)
HCT: 32 % — ABNORMAL LOW (ref 36.0–46.0)
Hemoglobin: 10.9 g/dL — ABNORMAL LOW (ref 12.0–15.0)
Potassium: 3.2 mEq/L — ABNORMAL LOW (ref 3.5–5.1)
Sodium: 136 mEq/L (ref 135–145)

## 2010-08-13 LAB — POCT I-STAT, CHEM 8
Glucose, Bld: 122 mg/dL — ABNORMAL HIGH (ref 70–99)
HCT: 29 % — ABNORMAL LOW (ref 36.0–46.0)
Hemoglobin: 9.9 g/dL — ABNORMAL LOW (ref 12.0–15.0)
Potassium: 4.2 mEq/L (ref 3.5–5.1)
Sodium: 133 mEq/L — ABNORMAL LOW (ref 135–145)

## 2010-08-13 LAB — TYPE AND SCREEN
ABO/RH(D): A POS
Antibody Screen: NEGATIVE

## 2010-08-13 LAB — CBC
HCT: 29.4 % — ABNORMAL LOW (ref 36.0–46.0)
Hemoglobin: 10.2 g/dL — ABNORMAL LOW (ref 12.0–15.0)
RBC: 3.53 MIL/uL — ABNORMAL LOW (ref 3.87–5.11)

## 2010-08-13 LAB — POCT I-STAT 3, ART BLOOD GAS (G3+)
Bicarbonate: 28.9 mEq/L — ABNORMAL HIGH (ref 20.0–24.0)
pCO2 arterial: 37.8 mmHg (ref 35.0–45.0)
pO2, Arterial: 60 mmHg — ABNORMAL LOW (ref 80.0–100.0)

## 2010-08-13 LAB — PROTIME-INR
INR: 1.2 (ref 0.00–1.49)
Prothrombin Time: 15.4 seconds — ABNORMAL HIGH (ref 11.6–15.2)

## 2010-08-13 LAB — APTT: aPTT: 34 seconds (ref 24–37)

## 2010-08-14 ENCOUNTER — Inpatient Hospital Stay (HOSPITAL_COMMUNITY): Payer: Medicare Other

## 2010-08-14 DIAGNOSIS — I4891 Unspecified atrial fibrillation: Secondary | ICD-10-CM

## 2010-08-14 LAB — POCT I-STAT 3, ART BLOOD GAS (G3+)
Acid-base deficit: 6 mmol/L — ABNORMAL HIGH (ref 0.0–2.0)
Acid-base deficit: 1 mmol/L (ref 0.0–2.0)
Acid-base deficit: 4 mmol/L — ABNORMAL HIGH (ref 0.0–2.0)
Acid-base deficit: 5 mmol/L — ABNORMAL HIGH (ref 0.0–2.0)
Bicarbonate: 18.8 mEq/L — ABNORMAL LOW (ref 20.0–24.0)
Bicarbonate: 21.8 mEq/L (ref 20.0–24.0)
Bicarbonate: 27 mEq/L — ABNORMAL HIGH (ref 20.0–24.0)
O2 Saturation: 100 %
O2 Saturation: 83 %
O2 Saturation: 81 %
O2 Saturation: 82 %
Patient temperature: 37.5
TCO2: 20 mmol/L (ref 0–100)
TCO2: 23 mmol/L (ref 0–100)
TCO2: 23 mmol/L (ref 0–100)
TCO2: 28 mmol/L (ref 0–100)
pCO2 arterial: 39.4 mmHg (ref 35.0–45.0)
pCO2 arterial: 38.7 mmHg (ref 35.0–45.0)
pCO2 arterial: 44.2 mmHg (ref 35.0–45.0)
pCO2 arterial: 47.5 mmHg — ABNORMAL HIGH (ref 35.0–45.0)
pH, Arterial: 7.568 — ABNORMAL HIGH (ref 7.350–7.400)
pH, Arterial: 7.38 (ref 7.350–7.400)
pH, Arterial: 7.313 — ABNORMAL LOW (ref 7.350–7.400)
pH, Arterial: 7.487 — ABNORMAL HIGH (ref 7.350–7.400)
pO2, Arterial: 366 mmHg — ABNORMAL HIGH (ref 80.0–100.0)
pO2, Arterial: 51 mmHg — ABNORMAL LOW (ref 80.0–100.0)
pO2, Arterial: 50 mmHg — ABNORMAL LOW (ref 80.0–100.0)

## 2010-08-14 LAB — POCT I-STAT 4, (NA,K, GLUC, HGB,HCT)
Glucose, Bld: 274 mg/dL — ABNORMAL HIGH (ref 70–99)
Glucose, Bld: 188 mg/dL — ABNORMAL HIGH (ref 70–99)
Glucose, Bld: 215 mg/dL — ABNORMAL HIGH (ref 70–99)
Glucose, Bld: 237 mg/dL — ABNORMAL HIGH (ref 70–99)
Glucose, Bld: 178 mg/dL — ABNORMAL HIGH (ref 70–99)
HCT: 29 % — ABNORMAL LOW (ref 36.0–46.0)
HCT: 38 % (ref 36.0–46.0)
HCT: 26 % — ABNORMAL LOW (ref 36.0–46.0)
HCT: 16 % — ABNORMAL LOW (ref 36.0–46.0)
HCT: 37 % (ref 36.0–46.0)
HCT: 38 % (ref 36.0–46.0)
Hemoglobin: 4.8 g/dL — CL (ref 12.0–15.0)
Hemoglobin: 9.9 g/dL — ABNORMAL LOW (ref 12.0–15.0)
Hemoglobin: 7.1 g/dL — ABNORMAL LOW (ref 12.0–15.0)
Hemoglobin: 8.8 g/dL — ABNORMAL LOW (ref 12.0–15.0)
Hemoglobin: 12.9 g/dL (ref 12.0–15.0)
Hemoglobin: 5.4 g/dL — CL (ref 12.0–15.0)
Potassium: 3.8 mEq/L (ref 3.5–5.1)
Potassium: 5.9 mEq/L — ABNORMAL HIGH (ref 3.5–5.1)
Potassium: 8.1 mEq/L (ref 3.5–5.1)
Potassium: 3.5 mEq/L (ref 3.5–5.1)
Potassium: 4.5 mEq/L (ref 3.5–5.1)
Sodium: 131 mEq/L — ABNORMAL LOW (ref 135–145)
Sodium: 149 mEq/L — ABNORMAL HIGH (ref 135–145)
Sodium: 137 mEq/L (ref 135–145)
Sodium: 139 mEq/L (ref 135–145)
Sodium: 135 mEq/L (ref 135–145)
Sodium: 146 mEq/L — ABNORMAL HIGH (ref 135–145)
Sodium: 147 mEq/L — ABNORMAL HIGH (ref 135–145)

## 2010-08-14 LAB — GLUCOSE, CAPILLARY
Glucose-Capillary: 105 mg/dL — ABNORMAL HIGH (ref 70–99)
Glucose-Capillary: 108 mg/dL — ABNORMAL HIGH (ref 70–99)
Glucose-Capillary: 131 mg/dL — ABNORMAL HIGH (ref 70–99)
Glucose-Capillary: 135 mg/dL — ABNORMAL HIGH (ref 70–99)
Glucose-Capillary: 151 mg/dL — ABNORMAL HIGH (ref 70–99)
Glucose-Capillary: 163 mg/dL — ABNORMAL HIGH (ref 70–99)
Glucose-Capillary: 167 mg/dL — ABNORMAL HIGH (ref 70–99)
Glucose-Capillary: 96 mg/dL (ref 70–99)
Glucose-Capillary: 97 mg/dL (ref 70–99)
Glucose-Capillary: 93 mg/dL (ref 70–99)
Glucose-Capillary: 95 mg/dL (ref 70–99)

## 2010-08-14 LAB — CBC
Hemoglobin: 9.5 g/dL — ABNORMAL LOW (ref 12.0–15.0)
MCHC: 34.5 g/dL (ref 30.0–36.0)
RBC: 3.29 MIL/uL — ABNORMAL LOW (ref 3.87–5.11)

## 2010-08-14 LAB — POCT I-STAT 3, VENOUS BLOOD GAS (G3P V)
Acid-base deficit: 7 mmol/L — ABNORMAL HIGH (ref 0.0–2.0)
O2 Saturation: 87 %

## 2010-08-14 LAB — BASIC METABOLIC PANEL
CO2: 28 mEq/L (ref 19–32)
Calcium: 7.6 mg/dL — ABNORMAL LOW (ref 8.4–10.5)
Chloride: 95 mEq/L — ABNORMAL LOW (ref 96–112)
GFR calc Af Amer: 35 mL/min — ABNORMAL LOW (ref >60)
Sodium: 133 mEq/L — ABNORMAL LOW (ref 135–145)

## 2010-08-14 LAB — PREPARE PLATELET PHERESIS: Unit division: 0

## 2010-08-14 LAB — MAGNESIUM: Magnesium: 1.4 mg/dL — ABNORMAL LOW (ref 1.5–2.5)

## 2010-08-14 NOTE — Consult Note (Signed)
[name], [name]             ACCOUNT NO.:  [id number]  MEDICAL RECORD NO.:  [id number]           PATIENT TYPE:  I  LOCATION:  2305                         FACILITY:  [facility]  PHYSICIAN:  Salvatore Decent. [name], M.D. DATE OF BIRTH:  10-13-1929  DATE OF CONSULTATION:  08/09/2010 DATE OF DISCHARGE:                                CONSULTATION   REQUESTING PHYSICIAN:  Peter M. Swaziland, MD  REASON FOR CONSULTATION:  Acute ST-segment elevation myocardial infarction with cardiogenic shock, critical left main coronary artery stenosis.  HISTORY OF PRESENT ILLNESS:  [name] is a 77 year old female with known history of coronary artery disease, hypertension, hyperlipidemia, and cerebrovascular disease.  The patient has undergone multivessel percutaneous coronary intervention in the past including drug-eluting stent in the proximal left anterior descending coronary artery in March 2009.  On the morning of Aug 09, 2010, the patient underwent colonoscopy at [facility].  She had been taken off of Plavix earlier in the year and aspirin was stopped as well.  During the recovery phase after her procedure, she developed chest pain with ST-segment elevation on electrocardiograms consistent with acute ST-segment elevation myocardial infarction.  She was transported via EMS to [facility].  During route, she suffered ventricular tachycardia, but she spontaneously converted.  She was brought directly to the Cardiac Cath Lab upon arrival to [facility] where cardiac catheterization was performed by Dr. Swaziland.  This reveals stent thrombosis with 100% occlusion of the proximal left anterior descending coronary artery and clot extending back into the left main with critical left main coronary artery stenosis.  There is 70% stenosis in the right coronary artery.  There is cardiogenic shock and hypotension with acute respiratory failure.  Intra-aortic balloon pump was placed.  The patient was  intubated and mechanically ventilated. Attempts to pass guidewire was down the left anterior descending coronary artery and the left circumflex coronary artery is successful, but only limited TIMI 1 antegrade flow was reestablished and the patient remains in cardiogenic shock with acute ongoing ischemia.  Emergency surgical consultation was requested.  REVIEW OF SYSTEMS:  Cannot be obtained.  PAST MEDICAL HISTORY: 1. Coronary artery disease. 2. Hypertension. 3. Hyperlipidemia. 4. Cerebrovascular disease.  MEDICATIONS PRIOR TO ADMISSION:  Reportedly include Plavix, Vytorin, atenolol, Diovan HCT, and aspirin.  Plavix was discontinued some time earlier this month.  DRUG ALLERGIES:  SULFA.  SOCIAL HISTORY:  The patient is reportedly widowed and lives in [place] with her daughter.  She has remote history of tobacco use.  She still works 1 day a week at [employer].  FAMILY HISTORY:  Noncontributory.  PHYSICAL EXAMINATION:  The patient was briefly examined in the Cath Lab and noted to be intubated with acute respiratory failure and pulmonary edema with frothy secretions in her airway.  She is in cardiogenic shock and hypotensive on intravenous Levophed infusion.  She has ongoing ischemic changes.  Her skin is mottled and poorly perfused.  Cardiac catheterization performed by Dr. Swaziland is reviewed.  Findings are as discussed.  IMPRESSION:  Acute ST-segment elevation myocardial infarction characterized by acute stent thrombosis in the proximal left anterior descending coronary artery with  thrombus extending back to the left main with critical left main coronary stenosis and three-vessel coronary artery disease.  The patient has acute evolving myocardial ischemia with cardiogenic shock, acute respiratory failure, and pulmonary edema.  Her only chance that survival is with an attempted emergency salvage surgical revascularization.  PLAN:  We plan to proceed directly to the operating  room.  The patient's niece is briefly counseled.  The remainder of the family is in route to the hospital.     Salvatore Decent. [name], M.D.     CHO/MEDQ  D:  08/09/2010  T:  08/10/2010  Job:  [id number]  Electronically Signed by Tressie Stalker M.D. on 08/14/2010 03:28:36 PM

## 2010-08-15 ENCOUNTER — Inpatient Hospital Stay (HOSPITAL_COMMUNITY): Payer: Medicare Other

## 2010-08-15 LAB — GLUCOSE, CAPILLARY
Glucose-Capillary: 138 mg/dL — ABNORMAL HIGH (ref 70–99)
Glucose-Capillary: 154 mg/dL — ABNORMAL HIGH (ref 70–99)
Glucose-Capillary: 164 mg/dL — ABNORMAL HIGH (ref 70–99)
Glucose-Capillary: 193 mg/dL — ABNORMAL HIGH (ref 70–99)
Glucose-Capillary: 199 mg/dL — ABNORMAL HIGH (ref 70–99)

## 2010-08-15 LAB — POCT I-STAT 3, ART BLOOD GAS (G3+)
Bicarbonate: 22.7 mEq/L (ref 20.0–24.0)
Bicarbonate: 22.8 mEq/L (ref 20.0–24.0)
O2 Saturation: 97 %
O2 Saturation: 96 %
Patient temperature: 37.4
Patient temperature: 99
TCO2: 24 mmol/L (ref 0–100)
pO2, Arterial: 86 mmHg (ref 80.0–100.0)
pO2, Arterial: 78 mmHg — ABNORMAL LOW (ref 80.0–100.0)

## 2010-08-15 LAB — CBC
HCT: 28.7 % — ABNORMAL LOW (ref 36.0–46.0)
MCH: 28.3 pg (ref 26.0–34.0)
MCHC: 34.1 g/dL (ref 30.0–36.0)
MCV: 82.9 fL (ref 78.0–100.0)
RDW: 15.3 % (ref 11.5–15.5)
WBC: 7.9 10*3/uL (ref 4.0–10.5)

## 2010-08-15 LAB — COMPREHENSIVE METABOLIC PANEL
ALT: 219 U/L — ABNORMAL HIGH (ref 0–35)
AST: 133 U/L — ABNORMAL HIGH (ref 0–37)
BUN: 37 mg/dL — ABNORMAL HIGH (ref 6–23)
GFR calc non Af Amer: 24 mL/min — ABNORMAL LOW (ref >60)
Glucose, Bld: 180 mg/dL — ABNORMAL HIGH (ref 70–99)
Potassium: 3.2 mEq/L — ABNORMAL LOW (ref 3.5–5.1)
Total Bilirubin: 6 mg/dL — ABNORMAL HIGH (ref 0.3–1.2)
Total Protein: 5 g/dL — ABNORMAL LOW (ref 6.0–8.3)

## 2010-08-15 NOTE — Op Note (Signed)
[name], [name]             ACCOUNT NO.:  [id number]  MEDICAL RECORD NO.:  [id number]           PATIENT TYPE:  I  LOCATION:  2305                         FACILITY:  [facility]  PHYSICIAN:  Salvatore Decent. Dorris Fetch, M.D.DATE OF BIRTH:  04-02-1929  DATE OF PROCEDURE:  08/13/2010 DATE OF DISCHARGE:                              OPERATIVE REPORT   PREOPERATIVE DIAGNOSIS:  Open sternum following emergent coronary artery bypass grafting and left hemothorax.  POSTOPERATIVE DIAGNOSIS:  Open sternum following emergent coronary artery bypass grafting and left hemothorax.  PROCEDURE:  Sternal wound exploration, evacuation of left hemothorax, and sternal wound closure.  SURGEON:  Salvatore Decent. Dorris Fetch, MD  ANESTHESIA:  General.  FINDINGS:  Sponges were removed from anterior mediastinum.  Sternal osteoporosis.  Old blood and clot in left pleural space approximately a liter of volume evacuated.  No ongoing bleeding.  No hemodynamic changes with chest closure.  Transesophageal echo revealed anterior apical akinesis.  Remainder of the heart had good contractility.  CLINICAL NOTE:  [name] is a 77 year old woman who underwent emergent coronary artery bypass grafting by Dr. Tressie Stalker for a left main occlusion with cardiogenic shock.  At the completion of the procedure, the patient was hemodynamically unstable, would not tolerated chest closure.  The wound was closed with Esmarch and covered with a VAC.  In the interim, the patient has developed a left hemothorax.  Her family was advised that she should return to the operating room for washout of old clot, evacuation of hemothorax, and possible sternal closure.  The indications, risks, and benefits were discussed and they agreed to proceed.  OPERATIVE NOTE:  [name] was taken from the ICU to the operating room on Aug 13, 2010.  She was already intubated.  The patient did not have any hemodynamic changes during transportation.   General anesthesia was induced.  Transesophageal echocardiography was performed by Dr. Sharee Holster.  Please refer to separate dictated note for details. Overall, the majority of the left ventricle had good contractility. Left ventricle may have been slightly underfilled.  There was anterior apical akinesis.  The chest, abdomen, and groins were prepped and draped in the usual sterile fashion.  Intravenous antibiotics were administered.  PAS hose were in place for DVT prophylaxis.  The Esmarch was opened.  There was a small amount of clot in the anterior mediastinum.  There were 2 sponges which were removed.  Old clot was debrided and irrigated with warm saline.  The chest tubes had a fair amount of clot within them.  This was evacuated with a suction catheter. The left hemi sternum was elevated.  There was a large amount of clot with slight hematoma in the left chest.  This was evacuated.  There was no ongoing bleeding.  The chest was copiously irrigated with warm saline.  Final check for hemostasis was performed.  Decision was made to proceed with attempt to the close sternal wires.  The sternum then was closed with interrupted heavy gauge double stainless steel wires.  The patient tolerated the sternal closure well without any hemodynamic changes.  Transesophageal echocardiography after closure of the chest revealed no  change in left ventricular function.  The pectoralis fascia was closed with running #1 Vicryl sutures, subcutaneous tissue was closed with running 2-0 Vicryl suture, and the skin was closed with staples.  The patient remained hemodynamically stable.  A radial arterial line was placed by the Anesthesia service.  The patient then was transported from the operating room to the Surgical Intensive Care Unit in critical, but stable condition.     Salvatore Decent Dorris Fetch, M.D.     SCH/MEDQ  D:  08/13/2010  T:  08/13/2010  Job:  [id number]  cc:   Salvatore Decent. [name],  M.D.  Electronically Signed by Charlett Lango M.D. on 08/15/2010 03:36:42 PM

## 2010-08-16 ENCOUNTER — Inpatient Hospital Stay (HOSPITAL_COMMUNITY): Payer: Medicare Other

## 2010-08-16 LAB — POCT I-STAT 3, ART BLOOD GAS (G3+)
Acid-Base Excess: 2 mmol/L (ref 0.0–2.0)
Bicarbonate: 20 mEq/L (ref 20.0–24.0)
Bicarbonate: 19.6 mEq/L — ABNORMAL LOW (ref 20.0–24.0)
O2 Saturation: 96 %
O2 Saturation: 96 %
Patient temperature: 97.9
pCO2 arterial: 23.9 mmHg — ABNORMAL LOW (ref 35.0–45.0)
pCO2 arterial: 22.5 mmHg — ABNORMAL LOW (ref 35.0–45.0)
pH, Arterial: 7.556 — ABNORMAL HIGH (ref 7.350–7.400)
pO2, Arterial: 70 mmHg — ABNORMAL LOW (ref 80.0–100.0)
pO2, Arterial: 65 mmHg — ABNORMAL LOW (ref 80.0–100.0)

## 2010-08-16 LAB — GLUCOSE, CAPILLARY
Glucose-Capillary: 156 mg/dL — ABNORMAL HIGH (ref 70–99)
Glucose-Capillary: 177 mg/dL — ABNORMAL HIGH (ref 70–99)
Glucose-Capillary: 143 mg/dL — ABNORMAL HIGH (ref 70–99)
Glucose-Capillary: 76 mg/dL (ref 70–99)

## 2010-08-16 LAB — COMPREHENSIVE METABOLIC PANEL
ALT: 171 U/L — ABNORMAL HIGH (ref 0–35)
Albumin: 2.2 g/dL — ABNORMAL LOW (ref 3.5–5.2)
Calcium: 7.3 mg/dL — ABNORMAL LOW (ref 8.4–10.5)
GFR calc Af Amer: 30 mL/min — ABNORMAL LOW (ref >60)
Glucose, Bld: 191 mg/dL — ABNORMAL HIGH (ref 70–99)
Potassium: 3.3 mEq/L — ABNORMAL LOW (ref 3.5–5.1)
Sodium: 131 mEq/L — ABNORMAL LOW (ref 135–145)
Total Protein: 5.2 g/dL — ABNORMAL LOW (ref 6.0–8.3)

## 2010-08-16 LAB — CBC
HCT: 27.8 % — ABNORMAL LOW (ref 36.0–46.0)
MCHC: 35.3 g/dL (ref 30.0–36.0)
Platelets: 78 10*3/uL — ABNORMAL LOW (ref 150–400)
RDW: 15.6 % — ABNORMAL HIGH (ref 11.5–15.5)
WBC: 8.8 10*3/uL (ref 4.0–10.5)

## 2010-08-17 ENCOUNTER — Inpatient Hospital Stay (HOSPITAL_COMMUNITY): Payer: Medicare Other

## 2010-08-17 LAB — COMPREHENSIVE METABOLIC PANEL
ALT: 200 U/L — ABNORMAL HIGH (ref 0–35)
AST: 158 U/L — ABNORMAL HIGH (ref 0–37)
Albumin: 2.3 g/dL — ABNORMAL LOW (ref 3.5–5.2)
Alkaline Phosphatase: 160 U/L — ABNORMAL HIGH (ref 39–117)
BUN: 63 mg/dL — ABNORMAL HIGH (ref 6–23)
CO2: 16 mEq/L — ABNORMAL LOW (ref 19–32)
Calcium: 7.6 mg/dL — ABNORMAL LOW (ref 8.4–10.5)
Chloride: 96 mEq/L (ref 96–112)
Creatinine, Ser: 1.94 mg/dL — ABNORMAL HIGH (ref 0.4–1.2)
GFR calc Af Amer: 30 mL/min — ABNORMAL LOW (ref >60)
GFR calc non Af Amer: 25 mL/min — ABNORMAL LOW (ref >60)
Glucose, Bld: 214 mg/dL — ABNORMAL HIGH (ref 70–99)
Potassium: 4.8 mEq/L (ref 3.5–5.1)
Sodium: 129 mEq/L — ABNORMAL LOW (ref 135–145)
Total Bilirubin: 4 mg/dL — ABNORMAL HIGH (ref 0.3–1.2)
Total Protein: 5.7 g/dL — ABNORMAL LOW (ref 6.0–8.3)

## 2010-08-17 LAB — GLUCOSE, CAPILLARY
Glucose-Capillary: 166 mg/dL — ABNORMAL HIGH (ref 70–99)
Glucose-Capillary: 182 mg/dL — ABNORMAL HIGH (ref 70–99)
Glucose-Capillary: 190 mg/dL — ABNORMAL HIGH (ref 70–99)
Glucose-Capillary: 195 mg/dL — ABNORMAL HIGH (ref 70–99)
Glucose-Capillary: 195 mg/dL — ABNORMAL HIGH (ref 70–99)
Glucose-Capillary: 210 mg/dL — ABNORMAL HIGH (ref 70–99)

## 2010-08-17 LAB — CBC
HCT: 31.2 % — ABNORMAL LOW (ref 36.0–46.0)
Hemoglobin: 10.7 g/dL — ABNORMAL LOW (ref 12.0–15.0)
MCH: 28.8 pg (ref 26.0–34.0)
MCHC: 34.3 g/dL (ref 30.0–36.0)
MCV: 83.9 fL (ref 78.0–100.0)
Platelets: 131 10*3/uL — ABNORMAL LOW (ref 150–400)
RBC: 3.72 MIL/uL — ABNORMAL LOW (ref 3.87–5.11)
RDW: 16.7 % — ABNORMAL HIGH (ref 11.5–15.5)
WBC: 12.1 10*3/uL — ABNORMAL HIGH (ref 4.0–10.5)

## 2010-08-17 NOTE — H&P (Addendum)
NAMEREGAN, MCBRYAR NO.:  [id number]  MEDICAL RECORD NO.:  [id number]           PATIENT TYPE:  O  LOCATION:  [facility]                         FACILITY:  [facility]  PHYSICIAN:  Pachia Strum M. [place], M.D.  DATE OF BIRTH:  [birth year]  DATE OF ADMISSION:  08/09/2010 DATE OF DISCHARGE:                             HISTORY & PHYSICAL   PRIMARY CARDIOLOGIST:  [name], MD, [facility]  PRIMARY MEDICAL DOCTOR:  Dr. Sherryll Burger  CHIEF COMPLAINT:  Chest pain.  REASON FOR ADMISSION:  STEMI.  HISTORY OF PRESENT ILLNESS:  [name] is a 77 year old female with a history of known CAD, hypertension, hyperlipidemia, and carotid disease who presented to [facility] today for an endoscopic procedure. Sometime in the recovery phase, she developed chest pain and an EKG demonstrated ST elevation in the AVR, aVL, and lead 2 with reciprocal ST depression in II, III, VF, and V5 through V6.  Per EMS report, she subsequently had VT in the field and spontaneously converted.  She currently denies chest pain, but is still slightly sedated from her colonoscopy.  Lab work is pending.  She arrived to the cath lab urgently from [facility] for emergent cardiac catheterization.  PAST MEDICAL HISTORY: 1. CAD.     a.     Status post PTCA/drug-eluting stent placement to the      proximal LAD in March 2009.     b.     Prior RCA stenting in July 2011 with known occluded      marginal. 2. Hypertension. 3. Hyperlipidemia. 4. Carotid disease with 60-79% stenosis of the right internal carotid     artery by ultrasound in 2011, followed by Dr. Arbie Cookey.  OUTPATIENT MEDICATIONS:  Unable to obtain from the patient at this time, per her last office note in May 2011, was taking Vytorin, atenolol, Diovan HCT, aspirin, and nitro sublingual.  Plavix was discontinued at that time.  ALLERGIES:  SULFA.  SOCIAL HISTORY:  [name] is widowed and is from [place].  She is retired.  She has two children.  She is a remote  tobacco user.  FAMILY HISTORY:  Positive for CHF in her father and apparently both of her parents also has carotid disease.  REVIEW OF SYSTEMS:  No fevers, chills.  Positive for chest pain, possibly some shortness of breath.  All other systems were reviewed and otherwise negative except for those noted in HPI.  LABORATORY DATA:  Pending.  RADIOLOGY DATA:  Pending.  PHYSICAL EXAMINATION:  VITAL SIGNS:  Pulse 106, respirations 12, blood pressure 111/71 with most recent pressure 70/42. GENERAL:  Elderly white female in no acute distress who is somewhat sleepy. HEENT:  Normocephalic, atraumatic.  Extraocular movements intact.  Clear sclerae.  Nares are without discharge. NECK:  Supple with right carotid bruits. HEART:  Auscultation of the heart reveals regular rate and rhythm with S1 and S2 with soft systolic ejection murmur. LUNGS:  Sounds are clear to auscultation bilaterally without wheezes, rales, or rhonchi. ABDOMEN:  Soft, protuberant, nontender, and with normoactive bowel sounds.  No rebound or guarding. EXTREMITIES:  Warm and dry without edema.  She has 1+ pedal  pulses bilaterally. NEUROLOGIC:  She is sleepy, but arousable, expressed good understanding of the critical situation, follows commands and somewhat agitated at times.  ASSESSMENT/PLAN:  The patient was seen and examined by Dr. Swaziland and myself.  This is a 77 year old female with a history of known coronary artery disease, hypertension, hyperlipidemia, and carotid disease who presented to [facility] for endoscopic procedure and post procedurally developed chest pain and was subsequently found to have a STEMI.  She was transferred emergently for cardiac catheterization by Dr. Swaziland and is currently hypotensive as well.  She did have VT in the fields with spontaneous conversion.  She is currently on pressors with dopamine with plans for insertion of a balloon pump, last cardiac catheterization to follow to  further define her coronary anatomy.     [name], P.A.C.   ______________________________ Ettel Albergo M. [place], M.D.    DD/MEDQ  D:  08/09/2010  T:  08/09/2010  Job:  [id number]  cc:   [name], MD,FACC Dr. Sherryll Burger  Electronically Signed by [name]  on 08/17/2010 01:25:48 PM Electronically Signed by Arriyah Madej Sw[place]and M.D. on 08/22/2010 06:08:26 PM

## 2010-08-18 ENCOUNTER — Inpatient Hospital Stay (HOSPITAL_COMMUNITY): Payer: Medicare Other

## 2010-08-18 DIAGNOSIS — E1165 Type 2 diabetes mellitus with hyperglycemia: Secondary | ICD-10-CM

## 2010-08-18 DIAGNOSIS — IMO0001 Reserved for inherently not codable concepts without codable children: Secondary | ICD-10-CM

## 2010-08-18 LAB — COMPREHENSIVE METABOLIC PANEL
AST: 102 U/L — ABNORMAL HIGH (ref 0–37)
Albumin: 2.2 g/dL — ABNORMAL LOW (ref 3.5–5.2)
BUN: 73 mg/dL — ABNORMAL HIGH (ref 6–23)
Calcium: 7.4 mg/dL — ABNORMAL LOW (ref 8.4–10.5)
Creatinine, Ser: 1.74 mg/dL — ABNORMAL HIGH (ref 0.4–1.2)
GFR calc Af Amer: 34 mL/min — ABNORMAL LOW (ref >60)
Total Bilirubin: 3.2 mg/dL — ABNORMAL HIGH (ref 0.3–1.2)
Total Protein: 5.6 g/dL — ABNORMAL LOW (ref 6.0–8.3)

## 2010-08-18 LAB — CBC
MCH: 28.5 pg (ref 26.0–34.0)
MCHC: 33.3 g/dL (ref 30.0–36.0)
MCV: 85.4 fL (ref 78.0–100.0)
Platelets: 144 10*3/uL — ABNORMAL LOW (ref 150–400)
RBC: 3.62 MIL/uL — ABNORMAL LOW (ref 3.87–5.11)
RDW: 17.7 % — ABNORMAL HIGH (ref 11.5–15.5)

## 2010-08-18 LAB — GLUCOSE, CAPILLARY
Glucose-Capillary: 223 mg/dL — ABNORMAL HIGH (ref 70–99)
Glucose-Capillary: 158 mg/dL — ABNORMAL HIGH (ref 70–99)
Glucose-Capillary: 162 mg/dL — ABNORMAL HIGH (ref 70–99)
Glucose-Capillary: 166 mg/dL — ABNORMAL HIGH (ref 70–99)
Glucose-Capillary: 118 mg/dL — ABNORMAL HIGH (ref 70–99)
Glucose-Capillary: 132 mg/dL — ABNORMAL HIGH (ref 70–99)
Glucose-Capillary: 114 mg/dL — ABNORMAL HIGH (ref 70–99)
Glucose-Capillary: 103 mg/dL — ABNORMAL HIGH (ref 70–99)
Glucose-Capillary: 110 mg/dL — ABNORMAL HIGH (ref 70–99)
Glucose-Capillary: 108 mg/dL — ABNORMAL HIGH (ref 70–99)
Glucose-Capillary: 107 mg/dL — ABNORMAL HIGH (ref 70–99)

## 2010-08-19 LAB — COMPREHENSIVE METABOLIC PANEL
AST: 144 U/L — ABNORMAL HIGH (ref 0–37)
Albumin: 2.4 g/dL — ABNORMAL LOW (ref 3.5–5.2)
Calcium: 7.7 mg/dL — ABNORMAL LOW (ref 8.4–10.5)
Chloride: 102 mEq/L (ref 96–112)
Creatinine, Ser: 1.4 mg/dL — ABNORMAL HIGH (ref 0.4–1.2)
GFR calc Af Amer: 44 mL/min — ABNORMAL LOW (ref >60)

## 2010-08-19 LAB — GLUCOSE, CAPILLARY
Glucose-Capillary: 93 mg/dL (ref 70–99)
Glucose-Capillary: 81 mg/dL (ref 70–99)

## 2010-08-19 LAB — CBC
MCH: 28.7 pg (ref 26.0–34.0)
Platelets: 184 10*3/uL (ref 150–400)
RBC: 3.59 MIL/uL — ABNORMAL LOW (ref 3.87–5.11)

## 2010-08-20 ENCOUNTER — Inpatient Hospital Stay (HOSPITAL_COMMUNITY): Payer: Medicare Other

## 2010-08-20 LAB — GLUCOSE, CAPILLARY
Glucose-Capillary: 55 mg/dL — ABNORMAL LOW (ref 70–99)
Glucose-Capillary: 103 mg/dL — ABNORMAL HIGH (ref 70–99)
Glucose-Capillary: 104 mg/dL — ABNORMAL HIGH (ref 70–99)
Glucose-Capillary: 109 mg/dL — ABNORMAL HIGH (ref 70–99)
Glucose-Capillary: 109 mg/dL — ABNORMAL HIGH (ref 70–99)
Glucose-Capillary: 112 mg/dL — ABNORMAL HIGH (ref 70–99)
Glucose-Capillary: 113 mg/dL — ABNORMAL HIGH (ref 70–99)
Glucose-Capillary: 114 mg/dL — ABNORMAL HIGH (ref 70–99)
Glucose-Capillary: 164 mg/dL — ABNORMAL HIGH (ref 70–99)
Glucose-Capillary: 89 mg/dL (ref 70–99)
Glucose-Capillary: 111 mg/dL — ABNORMAL HIGH (ref 70–99)
Glucose-Capillary: 77 mg/dL (ref 70–99)

## 2010-08-20 LAB — COMPREHENSIVE METABOLIC PANEL
ALT: 149 U/L — ABNORMAL HIGH (ref 0–35)
AST: 107 U/L — ABNORMAL HIGH (ref 0–37)
Calcium: 7.8 mg/dL — ABNORMAL LOW (ref 8.4–10.5)
Creatinine, Ser: 1.37 mg/dL — ABNORMAL HIGH (ref 0.4–1.2)
GFR calc Af Amer: 45 mL/min — ABNORMAL LOW (ref >60)
Glucose, Bld: 111 mg/dL — ABNORMAL HIGH (ref 70–99)
Sodium: 143 mEq/L (ref 135–145)
Total Protein: 5.6 g/dL — ABNORMAL LOW (ref 6.0–8.3)

## 2010-08-20 LAB — DIFFERENTIAL
Basophils Absolute: 0 10*3/uL (ref 0.0–0.1)
Eosinophils Absolute: 0 10*3/uL (ref 0.0–0.7)
Lymphs Abs: 1 10*3/uL (ref 0.7–4.0)
Neutro Abs: 18.9 10*3/uL — ABNORMAL HIGH (ref 1.7–7.7)

## 2010-08-20 LAB — CBC
MCHC: 32.5 g/dL (ref 30.0–36.0)
RDW: 19 % — ABNORMAL HIGH (ref 11.5–15.5)

## 2010-08-21 ENCOUNTER — Inpatient Hospital Stay (HOSPITAL_COMMUNITY): Payer: Medicare Other

## 2010-08-21 LAB — BASIC METABOLIC PANEL
Calcium: 8.3 mg/dL — ABNORMAL LOW (ref 8.4–10.5)
GFR calc Af Amer: 44 mL/min — ABNORMAL LOW (ref >60)
GFR calc non Af Amer: 37 mL/min — ABNORMAL LOW (ref >60)
Glucose, Bld: 78 mg/dL (ref 70–99)
Sodium: 146 mEq/L — ABNORMAL HIGH (ref 135–145)

## 2010-08-21 LAB — GLUCOSE, CAPILLARY
Glucose-Capillary: 41 mg/dL — CL (ref 70–99)
Glucose-Capillary: 83 mg/dL (ref 70–99)
Glucose-Capillary: 74 mg/dL (ref 70–99)
Glucose-Capillary: 64 mg/dL — ABNORMAL LOW (ref 70–99)
Glucose-Capillary: 92 mg/dL (ref 70–99)
Glucose-Capillary: 150 mg/dL — ABNORMAL HIGH (ref 70–99)

## 2010-08-21 LAB — URINE CULTURE: Culture  Setup Time: 201206040016

## 2010-08-21 LAB — CBC
MCHC: 31.9 g/dL (ref 30.0–36.0)
Platelets: 208 10*3/uL (ref 150–400)
RDW: 19.5 % — ABNORMAL HIGH (ref 11.5–15.5)

## 2010-08-22 ENCOUNTER — Inpatient Hospital Stay (HOSPITAL_COMMUNITY): Payer: Medicare Other

## 2010-08-22 LAB — CULTURE, RESPIRATORY W GRAM STAIN

## 2010-08-22 LAB — GLUCOSE, CAPILLARY
Glucose-Capillary: 98 mg/dL (ref 70–99)
Glucose-Capillary: 138 mg/dL — ABNORMAL HIGH (ref 70–99)
Glucose-Capillary: 175 mg/dL — ABNORMAL HIGH (ref 70–99)

## 2010-08-22 LAB — CBC
MCH: 29.1 pg (ref 26.0–34.0)
MCHC: 31.3 g/dL (ref 30.0–36.0)
Platelets: 251 10*3/uL (ref 150–400)
RBC: 3.26 MIL/uL — ABNORMAL LOW (ref 3.87–5.11)
RDW: 20.1 % — ABNORMAL HIGH (ref 11.5–15.5)

## 2010-08-22 LAB — BASIC METABOLIC PANEL
BUN: 56 mg/dL — ABNORMAL HIGH (ref 6–23)
Calcium: 8.4 mg/dL (ref 8.4–10.5)
Creatinine, Ser: 1.32 mg/dL — ABNORMAL HIGH (ref 0.4–1.2)
GFR calc non Af Amer: 39 mL/min — ABNORMAL LOW (ref >60)

## 2010-08-22 NOTE — Cardiovascular Report (Signed)
NAMECAHTERINE, HEINZEL NO.:  [id number]  MEDICAL RECORD NO.:  [id number]           PATIENT TYPE:  LOCATION:                                 FACILITY:  PHYSICIAN:  Peter M. Swaziland, M.D.  DATE OF BIRTH:  1929-06-27  DATE OF PROCEDURE:  08/09/2010 DATE OF DISCHARGE:                           CARDIAC CATHETERIZATION   INDICATIONS FOR PROCEDURE:  The patient is a 77 year old white female who was transported from [facility] in [place] today for an acute ST- elevation anterior myocardial infarction associated with cardiogenic shock.  The patient has a known history of coronary artery disease and underwent stenting of the very proximal LAD in 2009.  She has been off Plavix therapy for over a year.  She underwent colonoscopy this morning and following this procedure developed acute chest pain associated with hypotension.  ECG showed new right bundle-branch block with ST elevation in the anterior precordial leads and reciprocal ST depression inferiorly.  She was hypotensive and hypoxic.  On transport from [place], the patient had ventricular tachycardia.  On arrival to our facility, she was in cardiogenic shock with blood pressure of 60/40. She was hypoxic.  She was still arousable and alert and aware of her surroundings.  She continued to complain of severe chest pain.  PROCEDURES:  Coronary angiography, balloon angioplasty of the proximal left anterior descending, left main, and left circumflex coronary artery and the placement of intraaortic balloon pump.  MEDICATIONS:  The patient was on a dopamine drip at 20 mcg/kg per minute.  She was started on additional drip of Levophed at 5 mcg per minute IV.  She received 2 amps of sodium bicarbonate.  She received Versed 2 mg IV x2.  She received a heparin bolus of now heparin bolus of 5000 units IV.  At the end of procedure, she received an additional 3000 units of heparin.  She received Integrilin bolus 180 mcg/kg IV  x2. Amiodarone 150 mg IV.  We initially obtained arterial access via the right femoral artery with a 6-French arterial sheath.  At this point, we confirmed that her central aortic pressure was 60/40.  We then placed a second sheath in the left femoral artery which was quite difficult due to her low perfusion pressure.  We then placed an intraaortic balloon pump via the left femoral artery access.  This did help augment her pressure.  She remained severely hypoxic and acidotic.  For this reason, Anesthesia was called and the patient was emergently intubated.  We then proceeded with coronary angiography.  This demonstrated left main stenosis of 90%.  The proximal LAD was occluded at the site of previous stent with TIMI grade 0 flow.  The left circumflex coronary artery also had a 90% ostial stenosis.  The right coronary artery was a dominant vessel and had a 60-70% stenosis in the midvessel.  Left ventricular angiography was not performed.  We proceeded at this point to intervene on the LAD and left circumflex lesions.  Both the left anterior descending and left circumflex coronary arteries were crossed with wires.  We then used a 2.0 x 20 mm apex balloon to re-perfuse  the LAD stent and the mid LAD. We extended balloon inflations into the left main and then we also separately ballooned the ostium of the left circumflex coronary artery. Despite reperfusion, the patient still had critical disease in the left main and the LAD and the ostium of circumflex.  We consulted Cardiothoracic Surgery at this point.  Dr. [name] came and assessed the patient and agreed to take her emergently for emergent bypass surgery. It was not felt that she was a candidate for further intervention at this point.  She was transported with intraaortic balloon pump in place and coronary guidewires still down the left circumflex and LAD vessels. She remained intubated and was on 100% oxygen support.  Her prognosis was  felt to still be critical and poor.  FINAL INTERPRETATION: 1. Severe left main and three-vessel obstructive atherosclerotic     coronary artery disease.  She has total occlusion.  She had total     occlusion of the ostium of the LAD at the site of previous stent. 2. Cardiogenic shock requiring IV pressors, intraaortic balloon pump,     and emergent intubation. 3. Successful reperfusion with balloon angioplasty, but with     persistent critical disease in the left main and ostial LAD and     left circumflex vessels.          ______________________________ Peter M. Swaziland, M.D.     PMJ/MEDQ  D:  08/09/2010  T:  08/10/2010  Job:  [id number]  cc:   [name], MD,FACC Salvatore Decent. [name], M.D.  Electronically Signed by PETER Swaziland M.D. on 08/22/2010 06:08:32 PM

## 2010-08-23 ENCOUNTER — Inpatient Hospital Stay (HOSPITAL_COMMUNITY): Payer: Medicare Other

## 2010-08-23 LAB — BASIC METABOLIC PANEL
BUN: 52 mg/dL — ABNORMAL HIGH (ref 6–23)
CO2: 29 mEq/L (ref 19–32)
Calcium: 8.5 mg/dL (ref 8.4–10.5)
Chloride: 103 mEq/L (ref 96–112)
Creatinine, Ser: 1.25 mg/dL — ABNORMAL HIGH (ref 0.4–1.2)
Glucose, Bld: 214 mg/dL — ABNORMAL HIGH (ref 70–99)

## 2010-08-23 LAB — GLUCOSE, CAPILLARY: Glucose-Capillary: 155 mg/dL — ABNORMAL HIGH (ref 70–99)

## 2010-08-23 LAB — CBC
HCT: 30.9 % — ABNORMAL LOW (ref 36.0–46.0)
Hemoglobin: 9.5 g/dL — ABNORMAL LOW (ref 12.0–15.0)
MCH: 29.1 pg (ref 26.0–34.0)
MCHC: 30.5 g/dL (ref 30.0–36.0)
MCHC: 30.7 g/dL (ref 30.0–36.0)
MCV: 94.8 fL (ref 78.0–100.0)
RBC: 3.26 MIL/uL — ABNORMAL LOW (ref 3.87–5.11)
RDW: 20.1 % — ABNORMAL HIGH (ref 11.5–15.5)

## 2010-08-23 LAB — CLOSTRIDIUM DIFFICILE BY PCR: Toxigenic C. Difficile by PCR: NEGATIVE

## 2010-08-23 LAB — PROTIME-INR
INR: 1.12 (ref 0.00–1.49)
Prothrombin Time: 14.6 seconds (ref 11.6–15.2)

## 2010-08-24 ENCOUNTER — Inpatient Hospital Stay (HOSPITAL_COMMUNITY): Payer: Medicare Other

## 2010-08-24 LAB — GLUCOSE, CAPILLARY: Glucose-Capillary: 107 mg/dL — ABNORMAL HIGH (ref 70–99)

## 2010-08-24 LAB — CBC
HCT: 29 % — ABNORMAL LOW (ref 36.0–46.0)
Hemoglobin: 9 g/dL — ABNORMAL LOW (ref 12.0–15.0)
MCHC: 31 g/dL (ref 30.0–36.0)
MCV: 94.5 fL (ref 78.0–100.0)

## 2010-08-24 LAB — BASIC METABOLIC PANEL
BUN: 46 mg/dL — ABNORMAL HIGH (ref 6–23)
CO2: 31 mEq/L (ref 19–32)
Calcium: 8.1 mg/dL — ABNORMAL LOW (ref 8.4–10.5)
Glucose, Bld: 117 mg/dL — ABNORMAL HIGH (ref 70–99)
Sodium: 140 mEq/L (ref 135–145)

## 2010-08-25 ENCOUNTER — Inpatient Hospital Stay (HOSPITAL_COMMUNITY): Payer: Medicare Other

## 2010-08-25 LAB — CBC
HCT: 28.8 % — ABNORMAL LOW (ref 36.0–46.0)
Hemoglobin: 8.8 g/dL — ABNORMAL LOW (ref 12.0–15.0)
WBC: 13.7 10*3/uL — ABNORMAL HIGH (ref 4.0–10.5)

## 2010-08-25 LAB — BASIC METABOLIC PANEL
BUN: 47 mg/dL — ABNORMAL HIGH (ref 6–23)
Chloride: 96 mEq/L (ref 96–112)
Glucose, Bld: 127 mg/dL — ABNORMAL HIGH (ref 70–99)
Potassium: 4 mEq/L (ref 3.5–5.1)

## 2010-08-25 LAB — GLUCOSE, CAPILLARY
Glucose-Capillary: 133 mg/dL — ABNORMAL HIGH (ref 70–99)
Glucose-Capillary: 119 mg/dL — ABNORMAL HIGH (ref 70–99)
Glucose-Capillary: 112 mg/dL — ABNORMAL HIGH (ref 70–99)
Glucose-Capillary: 125 mg/dL — ABNORMAL HIGH (ref 70–99)

## 2010-08-26 LAB — BASIC METABOLIC PANEL
BUN: 42 mg/dL — ABNORMAL HIGH (ref 6–23)
CO2: 33 mEq/L — ABNORMAL HIGH (ref 19–32)
Calcium: 7.9 mg/dL — ABNORMAL LOW (ref 8.4–10.5)
Chloride: 96 mEq/L (ref 96–112)
Creatinine, Ser: 1.22 mg/dL — ABNORMAL HIGH (ref 0.4–1.2)

## 2010-08-26 LAB — GLUCOSE, CAPILLARY
Glucose-Capillary: 118 mg/dL — ABNORMAL HIGH (ref 70–99)
Glucose-Capillary: 94 mg/dL (ref 70–99)
Glucose-Capillary: 194 mg/dL — ABNORMAL HIGH (ref 70–99)

## 2010-08-27 LAB — GLUCOSE, CAPILLARY
Glucose-Capillary: 121 mg/dL — ABNORMAL HIGH (ref 70–99)
Glucose-Capillary: 129 mg/dL — ABNORMAL HIGH (ref 70–99)
Glucose-Capillary: 15 mg/dL — CL (ref 70–99)
Glucose-Capillary: 162 mg/dL — ABNORMAL HIGH (ref 70–99)
Glucose-Capillary: 93 mg/dL (ref 70–99)
Glucose-Capillary: 98 mg/dL (ref 70–99)

## 2010-08-28 ENCOUNTER — Encounter: Payer: Self-pay | Admitting: Cardiology

## 2010-08-28 ENCOUNTER — Inpatient Hospital Stay (HOSPITAL_COMMUNITY): Payer: Medicare Other

## 2010-08-28 LAB — GLUCOSE, CAPILLARY
Glucose-Capillary: 139 mg/dL — ABNORMAL HIGH (ref 70–99)
Glucose-Capillary: 87 mg/dL (ref 70–99)

## 2010-08-28 LAB — BASIC METABOLIC PANEL
BUN: 33 mg/dL — ABNORMAL HIGH (ref 6–23)
CO2: 34 mEq/L — ABNORMAL HIGH (ref 19–32)
Calcium: 8.3 mg/dL — ABNORMAL LOW (ref 8.4–10.5)
Glucose, Bld: 147 mg/dL — ABNORMAL HIGH (ref 70–99)
Sodium: 135 mEq/L (ref 135–145)

## 2010-08-28 LAB — CBC
HCT: 28.7 % — ABNORMAL LOW (ref 36.0–46.0)
Hemoglobin: 9.1 g/dL — ABNORMAL LOW (ref 12.0–15.0)
MCH: 29.6 pg (ref 26.0–34.0)
RBC: 3.07 MIL/uL — ABNORMAL LOW (ref 3.87–5.11)

## 2010-08-29 ENCOUNTER — Inpatient Hospital Stay (HOSPITAL_COMMUNITY): Payer: Medicare Other

## 2010-08-29 LAB — BASIC METABOLIC PANEL
BUN: 34 mg/dL — ABNORMAL HIGH (ref 6–23)
Calcium: 8.6 mg/dL (ref 8.4–10.5)
GFR calc Af Amer: 58 mL/min — ABNORMAL LOW (ref >60)
GFR calc non Af Amer: 48 mL/min — ABNORMAL LOW (ref >60)
Potassium: 3.9 mEq/L (ref 3.5–5.1)

## 2010-08-29 LAB — GLUCOSE, CAPILLARY
Glucose-Capillary: 166 mg/dL — ABNORMAL HIGH (ref 70–99)
Glucose-Capillary: 147 mg/dL — ABNORMAL HIGH (ref 70–99)
Glucose-Capillary: 161 mg/dL — ABNORMAL HIGH (ref 70–99)

## 2010-08-30 ENCOUNTER — Inpatient Hospital Stay (HOSPITAL_COMMUNITY): Payer: Medicare Other

## 2010-08-30 DIAGNOSIS — I2510 Atherosclerotic heart disease of native coronary artery without angina pectoris: Secondary | ICD-10-CM

## 2010-08-30 DIAGNOSIS — R1310 Dysphagia, unspecified: Secondary | ICD-10-CM

## 2010-08-30 DIAGNOSIS — R5381 Other malaise: Secondary | ICD-10-CM

## 2010-08-30 LAB — CBC
HCT: 29 % — ABNORMAL LOW (ref 36.0–46.0)
Hemoglobin: 9 g/dL — ABNORMAL LOW (ref 12.0–15.0)
MCV: 94.2 fL (ref 78.0–100.0)
RBC: 3.08 MIL/uL — ABNORMAL LOW (ref 3.87–5.11)
WBC: 5.9 10*3/uL (ref 4.0–10.5)

## 2010-08-30 LAB — BASIC METABOLIC PANEL
BUN: 33 mg/dL — ABNORMAL HIGH (ref 6–23)
CO2: 32 mEq/L (ref 19–32)
Chloride: 98 mEq/L (ref 96–112)
Creatinine, Ser: 0.98 mg/dL (ref 0.4–1.2)
Potassium: 3.9 mEq/L (ref 3.5–5.1)

## 2010-08-30 LAB — GLUCOSE, CAPILLARY
Glucose-Capillary: 166 mg/dL — ABNORMAL HIGH (ref 70–99)
Glucose-Capillary: 87 mg/dL (ref 70–99)

## 2010-08-31 ENCOUNTER — Encounter: Payer: Self-pay | Admitting: *Deleted

## 2010-08-31 ENCOUNTER — Other Ambulatory Visit: Payer: Self-pay

## 2010-08-31 ENCOUNTER — Inpatient Hospital Stay (HOSPITAL_COMMUNITY): Payer: Medicare Other

## 2010-08-31 LAB — GLUCOSE, CAPILLARY
Glucose-Capillary: 175 mg/dL — ABNORMAL HIGH (ref 70–99)
Glucose-Capillary: 88 mg/dL (ref 70–99)
Glucose-Capillary: 128 mg/dL — ABNORMAL HIGH (ref 70–99)

## 2010-08-31 LAB — BASIC METABOLIC PANEL
BUN: 32 mg/dL — ABNORMAL HIGH (ref 6–23)
CO2: 29 mEq/L (ref 19–32)
GFR calc non Af Amer: 56 mL/min — ABNORMAL LOW (ref >60)
Glucose, Bld: 192 mg/dL — ABNORMAL HIGH (ref 70–99)
Potassium: 4.2 mEq/L (ref 3.5–5.1)
Sodium: 137 mEq/L (ref 135–145)

## 2010-08-31 LAB — CBC
Hemoglobin: 8.8 g/dL — ABNORMAL LOW (ref 12.0–15.0)
MCH: 29.4 pg (ref 26.0–34.0)
MCHC: 31 g/dL (ref 30.0–36.0)
MCV: 95 fL (ref 78.0–100.0)
Platelets: 383 10*3/uL (ref 150–400)

## 2010-09-01 ENCOUNTER — Inpatient Hospital Stay (HOSPITAL_COMMUNITY): Payer: Medicare Other

## 2010-09-01 DIAGNOSIS — IMO0001 Reserved for inherently not codable concepts without codable children: Secondary | ICD-10-CM

## 2010-09-01 DIAGNOSIS — E1165 Type 2 diabetes mellitus with hyperglycemia: Secondary | ICD-10-CM

## 2010-09-01 LAB — COMPREHENSIVE METABOLIC PANEL
ALT: 43 U/L — ABNORMAL HIGH (ref 0–35)
AST: 31 U/L (ref 0–37)
Albumin: 2.4 g/dL — ABNORMAL LOW (ref 3.5–5.2)
Alkaline Phosphatase: 106 U/L (ref 39–117)
BUN: 32 mg/dL — ABNORMAL HIGH (ref 6–23)
CO2: 29 mEq/L (ref 19–32)
Calcium: 8.7 mg/dL (ref 8.4–10.5)
Chloride: 100 mEq/L (ref 96–112)
Creatinine, Ser: 0.92 mg/dL (ref 0.50–1.10)
GFR calc Af Amer: >60 mL/min (ref >60)
GFR calc non Af Amer: 59 mL/min — ABNORMAL LOW (ref >60)
Glucose, Bld: 141 mg/dL — ABNORMAL HIGH (ref 70–99)
Potassium: 4.5 mEq/L (ref 3.5–5.1)
Sodium: 137 mEq/L (ref 135–145)
Total Bilirubin: 0.6 mg/dL (ref 0.3–1.2)
Total Protein: 6.9 g/dL (ref 6.0–8.3)

## 2010-09-01 LAB — CBC
HCT: 31.1 % — ABNORMAL LOW (ref 36.0–46.0)
Hemoglobin: 9.8 g/dL — ABNORMAL LOW (ref 12.0–15.0)
MCH: 29.6 pg (ref 26.0–34.0)
MCHC: 31.5 g/dL (ref 30.0–36.0)
MCV: 94 fL (ref 78.0–100.0)
Platelets: 412 10*3/uL — ABNORMAL HIGH (ref 150–400)
RBC: 3.31 MIL/uL — ABNORMAL LOW (ref 3.87–5.11)
RDW: 19 % — ABNORMAL HIGH (ref 11.5–15.5)
WBC: 8 10*3/uL (ref 4.0–10.5)

## 2010-09-01 LAB — PREALBUMIN: Prealbumin: 25 mg/dL (ref 17.0–34.0)

## 2010-09-01 LAB — GLUCOSE, CAPILLARY
Glucose-Capillary: 116 mg/dL — ABNORMAL HIGH (ref 70–99)
Glucose-Capillary: 87 mg/dL (ref 70–99)

## 2010-09-02 ENCOUNTER — Inpatient Hospital Stay (HOSPITAL_COMMUNITY): Payer: Medicare Other

## 2010-09-02 LAB — CBC
HCT: 29.3 % — ABNORMAL LOW (ref 36.0–46.0)
Hemoglobin: 9.1 g/dL — ABNORMAL LOW (ref 12.0–15.0)
MCHC: 31.1 g/dL (ref 30.0–36.0)
RDW: 18.4 % — ABNORMAL HIGH (ref 11.5–15.5)
WBC: 6.4 10*3/uL (ref 4.0–10.5)

## 2010-09-02 LAB — GLUCOSE, CAPILLARY
Glucose-Capillary: 101 mg/dL — ABNORMAL HIGH (ref 70–99)
Glucose-Capillary: 105 mg/dL — ABNORMAL HIGH (ref 70–99)
Glucose-Capillary: 291 mg/dL — ABNORMAL HIGH (ref 70–99)
Glucose-Capillary: 44 mg/dL — CL (ref 70–99)
Glucose-Capillary: 87 mg/dL (ref 70–99)

## 2010-09-02 LAB — BASIC METABOLIC PANEL
BUN: 31 mg/dL — ABNORMAL HIGH (ref 6–23)
Chloride: 97 mEq/L (ref 96–112)
GFR calc Af Amer: >60 mL/min (ref >60)
GFR calc non Af Amer: >60 mL/min (ref >60)
Glucose, Bld: 127 mg/dL — ABNORMAL HIGH (ref 70–99)
Potassium: 4.6 mEq/L (ref 3.5–5.1)
Sodium: 136 mEq/L (ref 135–145)

## 2010-09-03 DIAGNOSIS — I219 Acute myocardial infarction, unspecified: Secondary | ICD-10-CM

## 2010-09-03 LAB — GLUCOSE, CAPILLARY
Glucose-Capillary: 93 mg/dL (ref 70–99)
Glucose-Capillary: 63 mg/dL — ABNORMAL LOW (ref 70–99)

## 2010-09-04 LAB — GLUCOSE, CAPILLARY: Glucose-Capillary: 72 mg/dL (ref 70–99)

## 2010-09-05 LAB — GLUCOSE, CAPILLARY
Glucose-Capillary: 114 mg/dL — ABNORMAL HIGH (ref 70–99)
Glucose-Capillary: 100 mg/dL — ABNORMAL HIGH (ref 70–99)
Glucose-Capillary: 85 mg/dL (ref 70–99)

## 2010-09-06 ENCOUNTER — Inpatient Hospital Stay
Admission: AD | Admit: 2010-09-06 | Discharge: 2010-10-15 | Disposition: A | Discharge status: Home / Self Care | Payer: Medicare Other | Source: Ambulatory Visit | Attending: Pulmonary Disease | Admitting: Pulmonary Disease

## 2010-09-06 DIAGNOSIS — J90 Pleural effusion, not elsewhere classified: Principal | ICD-10-CM

## 2010-09-06 DIAGNOSIS — I509 Heart failure, unspecified: Secondary | ICD-10-CM

## 2010-09-06 LAB — GLUCOSE, CAPILLARY: Glucose-Capillary: 80 mg/dL (ref 70–99)

## 2010-09-13 ENCOUNTER — Ambulatory Visit (INDEPENDENT_AMBULATORY_CARE_PROVIDER_SITE_OTHER): Payer: Medicare Other | Admitting: Physician Assistant

## 2010-09-13 ENCOUNTER — Encounter: Payer: Self-pay | Admitting: Cardiology

## 2010-09-13 VITALS — BP 104/68 | HR 92 | Ht 63.0 in | Wt 123.0 lb

## 2010-09-13 DIAGNOSIS — I255 Ischemic cardiomyopathy: Secondary | ICD-10-CM

## 2010-09-13 DIAGNOSIS — I2510 Atherosclerotic heart disease of native coronary artery without angina pectoris: Secondary | ICD-10-CM

## 2010-09-13 DIAGNOSIS — I4891 Unspecified atrial fibrillation: Secondary | ICD-10-CM

## 2010-09-13 DIAGNOSIS — I6529 Occlusion and stenosis of unspecified carotid artery: Secondary | ICD-10-CM

## 2010-09-13 DIAGNOSIS — I2589 Other forms of chronic ischemic heart disease: Secondary | ICD-10-CM

## 2010-09-13 DIAGNOSIS — I480 Paroxysmal atrial fibrillation: Secondary | ICD-10-CM | POA: Insufficient documentation

## 2010-09-13 DIAGNOSIS — E785 Hyperlipidemia, unspecified: Secondary | ICD-10-CM

## 2010-09-13 DIAGNOSIS — I5020 Unspecified systolic (congestive) heart failure: Secondary | ICD-10-CM

## 2010-09-13 MED ORDER — ATORVASTATIN CALCIUM 80 MG PO TABS: 80.0000 mg | ORAL_TABLET | Freq: Every day | ORAL | Status: DC

## 2010-09-13 NOTE — Patient Instructions (Signed)
   Begin Lipitor 80mg  every evening    Labs - due in 3 months for FLP & LFT to follow up on cholesterol   Chest x-ray   Follow up in 1 month.

## 2010-09-13 NOTE — Progress Notes (Signed)
HPI: patient presents for office followup, and was last seen here in May 2011, by Dr. Andee Lineman.  However, she recently presented to [facility] with acute STEMI, with associated cardiogenic shock. Her daughter states that she had been scheduled for elective colonoscopy here at [facility], which apparently was completed, but then developed acute chest pain. The patient does not recall any of this. Records indicate that she had ST elevation in the anterior precordial leads, with reciprocal ST depression inferiorly, and was hypotensive and hypoxic. She also had ventricular tachycardia, en route. She went directly to the catheterization lab, and was studied by Dr. Swaziland, who found severe left main and three-vessel CAD, with total occlusion of the ostial LAD, at site of previous stent. She required emergent intubation, IABP, and IV vasopressors. She was successfully reperfused with balloon angioplasty, but had persistent critical left main and ostial LAD and CFX disease.  2-D echocardiography indicated severe LVD (EF 25-30%), with anteroseptal, anterior, and apical AK. There was no pericardial effusion. Mild/moderate MR noted. Normal RV function.  Carotid Dopplers indicated 60-79% right ICA stenosis.  Patient required emergent surgical revascularization, performed by Dr. Tressie Stalker, with grafting of the SVG to the LAD, SVG to the obtuse marginal, and SVG to the RCA.  Postop course notable for atrial fibrillation, which was chemically converted to NSR, by time of discharge. She was not discharged on amiodarone.  Patient was discharged from [facility] on 6/20, and is currently at [facility].   Clinically, she complains of chest soreness, as well as lower extremity edema, orthopnea, and PND. She had some recent labs, June 25th, indicating potassium 4.4, BUN 20, creatinine 1.33, and BNP greater than 1300. Hemoglobin 9.6 hematocrit 30.  Allergies  Allergen Reactions  . Sulfonamide Derivatives      Current Outpatient Prescriptions on File Prior to Visit  Medication Sig Dispense Refill  . nitroGLYCERIN (NITROSTAT) 0.4 MG SL tablet Place 0.4 mg under the tongue every 5 (five) minutes as needed.        Marland Kitchen DISCONTD: aspirin 81 MG tablet Take 81 mg by mouth daily.        Marland Kitchen DISCONTD: atenolol (TENORMIN) 50 MG tablet Take 50 mg by mouth daily.        Marland Kitchen DISCONTD: ezetimibe-simvastatin (VYTORIN) 10-20 MG per tablet Take 1 tablet by mouth at bedtime.        Marland Kitchen DISCONTD: valsartan-hydrochlorothiazide (DIOVAN-HCT) 160-25 MG per tablet Take 1 tablet by mouth daily.          Past Medical History  Diagnosis Date  . Unspecified essential hypertension   . Edema   . Coronary atherosclerosis of native coronary artery     Past Surgical History  Procedure Date  . Tonsillectomy     History   Social History  . Marital Status: Widowed    Spouse Name: N/A    Number of Children: N/A  . Years of Education: N/A   Occupational History  . Not on file.   Social History Main Topics  . Smoking status: Never Smoker   . Smokeless tobacco: Never Used  . Alcohol Use: No  . Drug Use: Not on file  . Sexually Active: Not on file   Other Topics Concern  . Not on file   Social History Narrative   Regular exercise- yes     Family History  Problem Relation Age of Onset  . Cancer Other   . Coronary artery disease Other     ROS: Negative for exertional  chest pain, palpitations, presyncope/syncope, claudication, reflux, hematuria, hematochezia, or melena. Remaining systems reviewed, and are negative.   PHYSICAL EXAM:  BP 104/68  Pulse 92  Ht 5\' 3"  (1.6 m)  Wt 123 lb (55.792 kg)  BMI 21.79 kg/m2  GENERAL: well-nourished, well-developed; NAD HEENT: NCAT, PERRLA, EOMI; sclera clear; no xanthelasma NECK: palpable bilateral carotid pulses, right carotid bruit; no JVD; no TM LUNGS: diminished breath sounds in the bases, no crackles or wheezes CARDIAC: RRR (S1, S2); no significant murmurs; no  rubs or gallops ABDOMEN: soft, non-tender; intact BS EXTREMETIES: intact distal pulses; 1+ bilateral peripheral edema SKIN: well healing incisions, with no peri-incisional erythema or oozing MUSCULOSKELETAL: no joint deformity NEURO: no focal deficit; NL affect   EKG: normal sinus rhythm at 92 bpm;    ASSESSMENT & PLAN:

## 2010-09-13 NOTE — Assessment & Plan Note (Signed)
Maintaining normal sinus rhythm by electrocardiogram. This was successfully chemically cardioverted, prior to discharge, and she was not discharged on amiodarone. We'll need to reassess her risk for stroke, and determine whether or not she is a suitable candidate for Coumadin anticoagulation. Will continue full dose aspirin, for now.

## 2010-09-13 NOTE — Assessment & Plan Note (Signed)
Follow up with Dr. Gretta Began, as scheduled.

## 2010-09-13 NOTE — Assessment & Plan Note (Signed)
We'll evaluate further with a two-view chest x-ray. Recent BNP level greater than 3200. Currently on Lasix 40 mg b.i.d. Further recommendations to follow.

## 2010-09-13 NOTE — Assessment & Plan Note (Signed)
Currently stable on medical therapy, which includes aspirin and beta blocker. Status post recent emergent three-vessel CABG, secondary to critical left main and proximal LAD disease, secondary to stent thrombosis. Will add high-dose statin with Lipitor 80 mg daily, reassess lipid status in 12 weeks.

## 2010-09-13 NOTE — Assessment & Plan Note (Signed)
Patient appears volume overloaded by history and physical examination. Will order a two-view chest x-ray to rule out pulmonary edema and/or pleural effusions. She is currently on Lasix 40 mg b.i.d., and had blood work done earlier today. Will schedule early return visit with me in one month. She will need a repeat echocardiogram within the next 3 months, for reassessment of LVD. She may require formal evaluation for ICD implantation, if she demonstrates persistently depressed EF.

## 2010-09-13 NOTE — Assessment & Plan Note (Signed)
Will initiate statin therapy with Lipitor 80 mg daily, and reassess lipid status in 12 weeks. Target LDL 70 or less, if feasible.

## 2010-09-14 ENCOUNTER — Ambulatory Visit (HOSPITAL_COMMUNITY): Payer: Medicare Other | Attending: Internal Medicine

## 2010-09-14 NOTE — Discharge Summary (Signed)
NAMEJESSIAH, [name]             ACCOUNT NO.:  [id number]  MEDICAL RECORD NO.:  [id number]  LOCATION:  2018                         FACILITY:  [facility]  PHYSICIAN:  Salvatore Decent. [name], M.D. DATE OF BIRTH:  01-18-1930  DATE OF ADMISSION:  08/09/2010 DATE OF DISCHARGE:  09/05/2010                              DISCHARGE SUMMARY   HISTORY:  The patient is a 77 year old female with a history of known coronary artery disease, hypertension, hyperlipidemia, and carotid disease, who presented to [facility] on Aug 09, 2010, for an endoscopic procedure.  During the recovery phase, she developed chest pain and EKG demonstrated ST elevation in the AVR, aVL, and lead II with reciprocal ST depression in leads II, III, VF, and V5-V6.  Per EMS report, she subsequently had ventricular tachycardia in the field and spontaneously converted.  She was transferred to [facility] for urgent cardiac catheterization.  PAST MEDICAL HISTORY:  Includes coronary artery disease status post PTCA and drug-eluting stent placement in the proximal LAD in March 2009.  In addition, she has had right coronary artery stenting in July 2011.  She had a known occluded marginal at that time.  OTHER DIAGNOSES:  Include the following: 1. Hypertension. 2. Hyperlipidemia. 3. History of extracranial cerebrovascular occlusive disease with 60-     79% stenosis of the right internal carotid by ultrasound, followed     by Dr. Gretta Began.  OUTPATIENT MEDICATIONS:  Included Vytorin, atenolol, Diovan HCT, aspirin, and sublingual nitroglycerin.  Plavix was discontinued at the time for the scheduled endoscopy which was being done for diarrhea.  ALLERGIES:  SULFA.  SOCIAL HISTORY:  She is a widow.  She is retired.  She has 2 children. She is a remote tobacco user.  FAMILY HISTORY:  Positive for congestive heart failure in her father and apparently both her parents also had carotid disease.  REVIEW OF SYMPTOMS AND PHYSICAL  EXAMINATION:  Please see the history and physical done at the time of admission.  HOSPITAL COURSE:  The patient was admitted and was subsequently found to have ST-segment elevation myocardial infarction and was transferred for catheterization.  She had ventricular tachycardia and was hypotensive in the field.  She was treated with dopamine and plans were made for intraaortic balloon pump to be done additionally during the catheterization.  PROCEDURE:  A cardiac catheterization done emergently by Dr. Swaziland, revealed stent thrombosis with 100% occlusion of the proximal LAD coronary artery and clot extending back into the left main with critical left main coronary artery stenosis.  There is additionally 70% stenosis in the right coronary artery.  She developed further cardiogenic shock with hypotension, acute respiratory failure.  Intraaortic balloon pump was placed.  The patient was mechanically ventilated.  Attempts were made to pass a guidewire down the LAD coronary artery and the left circumflex were successful but only limited to TIMI-1 antegrade flow was reestablished.  As the patient remained in cardiogenic shock with ongoing ischemia, emergency surgical consultation was requested with Tressie Stalker MD, who evaluated the patient and her studies.  The patient was felt to have an acute evolving myocardial ischemia with cardiogenic shock and respiratory failure with associated pulmonary edema.  Her only chance  of survival was felt to be attempted emergency salvage surgical revascularization.  She was taken to the Cardiac Operating Room where she underwent the following procedure.  Emergency coronary artery bypass grafting x3, the following grafts were placed: 1. Saphenous vein graft to the LAD. 2. Saphenous vein graft to the obtuse marginal. 3. Saphenous vein graft to the right coronary artery.  The patient tolerated the procedure well, but did require aggressive replacement of  blood products for coagulopathy.  Additionally, her sternotomy required a temporary closure with Esmarch due to severely elevated airway pressures and hypoxemia that did not allow routine closure.  She was taken to the SICU in critical condition on multiple pressors as well as intraaortic balloon pump.  POSTOPERATIVE HOSPITAL COURSE:  The patient has had a difficult postoperative course as expected.  She was able to have her sternotomy closed on Aug 13, 2010, by Dr. Charlett Lango.  She required several days of aggressive inotropic support and ventilatory-dependent respiratory failure, but ultimately was able to be extubated and weaned from the support.  Additionally during the postoperative period, she has had atrial fibrillation, but has subsequently been chemically cardioverted to a normal sinus rhythm.  She had acute renal insufficiency which has improved over time.  She developed postoperative pulmonary infiltrates and was treated with a several day course of Zosyn with good improvement.  She has been followed closely by Nutrition and workup and treatment including swallowing studies was given to a gradual increase in nutrition from tube feedings, ultimately to taking regular oral intake.  This has been obviously a progression over time. Additionally, from a physical rehabilitation standpoint, she has been extraordinarily deconditioned with weakness but again shown gradual and excellent recovery in this regard.  She has had marked volume overload with congestive heart failure which is steadily improving with aggressive diuretic therapy which is being tapered over time.  On September 02, 2010, are 9.1 and 29.3 respectively.  Blood sugars have been under good control using aggressive management.  Her incisions are healing well without evidence of infection.  She has had significant issues related postoperative diarrhea which is felt to be at least in part connected to her preoperative  condition for which she was undergoing endoscopic evaluation.  Dr. Evette Cristal did see the patient in GI consult during the postoperative period and this has come under good control, but she may require further management as an outpatient as we do not have a definitive diagnosis.  It has at least in part been exacerbated by tube feedings.  Currently, her clinical situation requires mostly ongoing management of her congestive failure as well as physical rehabilitation.  She is felt to be candidate for further rehab and plans are being undertaken for transfer to a facility in [place] in the next day or so.  MEDICATIONS ON DISCHARGE:  At the time of this dictation include the following: 1. Lactinex 1 packet twice daily with meals. 2. Alprazolam 0.25 mg by mouth every 6 hours p.r.n. for anxiety. 3. Aspirin 325-mg tablet daily. 4. Lomotil 1 tablet by mouth 3 times a day p.r.n. for diarrhea. 5. Ensure nutritional supplement 3 times daily. 6. Lasix 40 mg twice daily. 7. Metoprolol 50 mg every 12 hours. 8. Nutritional bilberry liquid supplement twice daily. 9. Protonix 40 mg daily. 10.Potassium chloride 10 mEq twice daily. 11.Thick-It food thickener p.r.n. 12.Ultram 50 mg 1-2 every 6 hours p.r.n. for pain. 13.Diovan 80 mg q.12 hours.  FOLLOWUP:  Should include an appointment to see Dr. [name] 3  weeks post discharge and Dr. Andee Lineman 2 weeks post discharge.  She should also follow up with her gastroenterologist at [facility] for further evaluation of her diarrhea which was her initial problem.  FINAL DIAGNOSIS:  Severe coronary artery disease with ST-segment elevation myocardial infarction, requiring emergency coronary surgical revascularization.  OTHER DIAGNOSES: 1. Postoperative cardiogenic shock. 2. Postoperative ventilatory-dependent respiratory failure. 3. Postoperative coagulopathy. 4. Postoperative elevated liver function studies due to shock liver. 5. Postoperative renal insufficiency. 6.  Postoperative thrombocytopenia. 7. Postoperative coagulopathy. 8. Postoperative atrial fibrillation. 9. Chronic diarrhea. 10.History of hypertension. 11.History of hyperlipidemia. 12.History of extracranial cerebrovascular occlusive disease. 13.Allergy to sulfa. 14.Postoperative pneumonia. 15.Postoperative acute blood loss anemia. 16.Postoperative volume overload.  ACTIVITIES ON DISCHARGE:  Arrangements are being made for transfer to [facility] where she will require ongoing physical, occupational, speech, and nursing care.  NUTRITION:  She should continue her supplements.  She should continue oral diet as instructed, heart-healthy  WOUND CARE:  The patient's incisions may clean gently with soap and water.  Special care during her physical therapy to use routine sternal precautions following coronary surgical revascularization.  She is not to lift more than 10 pounds.     Rowe Clack, P.A.-C.   ______________________________ Salvatore Decent [name], M.D.    Sherryll Burger  D:  09/05/2010  T:  09/05/2010  Job:  [id number]  cc:   [name], MD,FACC Peter M. Swaziland, M.D. Graylin Shiver, M.D.  Electronically Signed by Gershon Crane P.A.-C. on 09/13/2010 12:12:47 PM Electronically Signed by Tressie Stalker M.D. on 09/14/2010 03:15:56 PM

## 2010-09-20 ENCOUNTER — Ambulatory Visit (HOSPITAL_COMMUNITY): Payer: Medicare Other | Attending: Internal Medicine

## 2010-09-20 DIAGNOSIS — I509 Heart failure, unspecified: Secondary | ICD-10-CM | POA: Insufficient documentation

## 2010-09-20 DIAGNOSIS — J90 Pleural effusion, not elsewhere classified: Secondary | ICD-10-CM | POA: Insufficient documentation

## 2010-09-24 ENCOUNTER — Encounter: Payer: Self-pay | Admitting: Physician Assistant

## 2010-09-28 ENCOUNTER — Ambulatory Visit (INDEPENDENT_AMBULATORY_CARE_PROVIDER_SITE_OTHER): Payer: Medicare Other | Admitting: Cardiology

## 2010-09-28 ENCOUNTER — Encounter: Payer: Self-pay | Admitting: Cardiology

## 2010-09-28 ENCOUNTER — Ambulatory Visit: Payer: Medicare Other | Admitting: Cardiology

## 2010-09-28 ENCOUNTER — Ambulatory Visit: Payer: Medicare Other | Admitting: Adult Health

## 2010-09-28 VITALS — BP 107/66 | HR 82 | Ht 60.0 in | Wt 119.0 lb

## 2010-09-28 DIAGNOSIS — I255 Ischemic cardiomyopathy: Secondary | ICD-10-CM

## 2010-09-28 DIAGNOSIS — I2510 Atherosclerotic heart disease of native coronary artery without angina pectoris: Secondary | ICD-10-CM

## 2010-09-28 DIAGNOSIS — I4891 Unspecified atrial fibrillation: Secondary | ICD-10-CM

## 2010-09-28 DIAGNOSIS — I6529 Occlusion and stenosis of unspecified carotid artery: Secondary | ICD-10-CM

## 2010-09-28 DIAGNOSIS — I480 Paroxysmal atrial fibrillation: Secondary | ICD-10-CM

## 2010-09-28 MED ORDER — CARVEDILOL 12.5 MG PO TABS
12.5000 mg | ORAL_TABLET | Freq: Two times a day (BID) | ORAL | Status: DC
Start: 2010-09-28 — End: 2011-12-06

## 2010-09-28 MED ORDER — SPIRONOLACTONE 25 MG PO TABS: 25.0000 mg | ORAL_TABLET | Freq: Every day | ORAL | Status: DC

## 2010-09-28 MED ORDER — VALSARTAN 320 MG PO TABS: 320.0000 mg | ORAL_TABLET | Freq: Every day | ORAL | Status: DC

## 2010-09-28 NOTE — Assessment & Plan Note (Deleted)
Chest pain has worrisome characteristics, but was unassociated with activity and has not recurred over the past 5 days.  Occlusion of a bypass graft would be unusual 3 months after bypass surgery.  There are no acute changes on EKG, but at least one set of cardiac markers would've been helpful.  We will proceed with a pharmacologic stress echocardiogram to assess any recovery in LV function following revascularization and to rule out stress-induced ischemia.  Patient should be treated with sublingual nitroglycerin and transferred to any pain emergency department if chest discomfort consistent with angina recurs.

## 2010-09-28 NOTE — Patient Instructions (Signed)
Your physician has requested that you have a dobutamine echocardiogram. For further information please visit [email or web address] Please follow instruction sheet as given.  Your physician has requested that you regularly monitor and record your blood pressure readings at nursing home. Please use the same machine at the same time of day to check your readings and record them to bring to your follow-up visit.  Your physician recommends that you weigh, daily, at the same time every day, and in the same amount of clothing. Please record your daily weights on the handout provided and bring it to your next appointment.  Your physician has recommended you make the following change in your medication: stop taking Metoprolol and Potassium. Start taking Coreg 12.5mg  daily and Aldactone 25mg  daily. Change Diovan to 320mg  daily (HOLD FOR BP"S LESS THAN 95 SYSTOLIC).  Your physician recommends that you return for lab work in: [place] in 1 week and a BMET in 3 weeks  Your physician recommends that you schedule a follow-up appointment in: [place] as previously planned

## 2010-09-28 NOTE — Assessment & Plan Note (Signed)
Patient has had persistent congestive heart failure since hospital discharge.  Diuretics have been adjusted, but she may need a further increase in furosemide.  Aldactone 25 mg will be added to her medical regime, potassium supplementation discontinued and serum potassium follow.  Although creatinine has increased from baseline, and it may be necessary to accept a moderate elevation in order to adequately control pulmonary edema.  Carvedilol will be substituted for metoprolol, as this is the preferred agent and ischemic cardiomyopathy.  She has had adequate blood pressure to tolerate appropriate medical therapy, but the initial dose of carvedilol will be 12.5 mg twice daily.  Followup in the office will be arranged.

## 2010-09-28 NOTE — Progress Notes (Signed)
HPI : [name] is seen urgently at Dr. Jannetta Quint request as the result of chest discomfort.  [name] suffered a large anterior myocardial infarction approximately 3 months ago and ultimately required emergent PCI and urgent CABG.  She has had a difficult postoperative course, initially with one month in hospital and now in a skilled nursing facility, but has been improving and is now able to walk and looking forward to discharge.  A few days ago, she noted a bandlike tightness around her lower chest associated with dyspnea.  Onset was at rest when she was in bed.  She arose and walk to the bathroom with some improvement.  Symptoms persisted for approximately one hour.  Basic laboratory studies were obtained following the episode, but no cardiac markers nor EKG is available for my review.  She has had no subsequent symptoms.    She has chronic class II dyspnea on exertion, but has no history of lung disease and has not been a tobacco user.  She denies cough, fever, sputum production, orthopnea and PND.  Serial chest x-ray reports were obtained and reviewed.  All of them, including one performed 10 days ago, revealed congestive heart failure.  Diuretic dose has been variable, and I am unclear as to when it was most recently increased or decreased.  Other recent records were obtained and reviewed.  Blood pressure has been generally good with a single systolic of less than 100 recorded in the past month.  No weights are available.  Creatinine values have been mildly elevated but stable.  Electrolytes have been normal.  CBC shows a mild persistent anemia.  Current Outpatient Prescriptions on File Prior to Visit  Medication Sig Dispense Refill  . ALPRAZolam (XANAX) 0.25 MG tablet Take 0.25 mg by mouth every 6 (six) hours as needed.        . diphenoxylate-atropine (LOMOTIL) 2.5-0.025 MG per tablet Take 1 tablet by mouth 3 (three) times daily as needed.        . furosemide (LASIX) 40 MG  tablet Take 40 mg by mouth as directed. 1 and 1/2 tabs bid      . nitroGLYCERIN (NITROSTAT) 0.4 MG SL tablet Place 0.4 mg under the tongue every 5 (five) minutes as needed.        . Nutritional Supplements (ENLIVE PO) Take 1 Can by mouth 2 (two) times daily.        . traMADol (ULTRAM) 50 MG tablet Take 50-100 mg by mouth every 6 (six) hours as needed.           Allergies  Allergen Reactions  . Sulfonamide Derivatives       Past medical history, social history, and family history reviewed and updated.  ROS: See history of present illness.  PHYSICAL EXAM: BP 107/66  Pulse 82  Ht 5' (1.524 m)  Wt 53.978 kg (119 lb)  BMI 23.24 kg/m2  SpO2 92%  General-Well developed; no acute distress; chronically ill appearance Body habitus-proportionate weight and height Neck-No JVD; right carotid bruits Lungs-clear lung fields; resonant to percussion Cardiovascular-normal PMI; normal S1 and S2; fourth heart sound without third heart sound; modest early systolic murmur Abdomen-normal bowel sounds; soft and non-tender without masses or organomegaly Musculoskeletal-No deformities, no cyanosis or clubbing Neurologic-Normal cranial nerves; symmetric strength and tone Skin-Warm, no significant lesions Extremities-distal pulses intact; trace edema  EKG: Normal sinus rhythm at a rate of 80; left atrial abnormality; right axis deviation; right bundle branch block; possible RVH; prior anteroseptal MI.  Comparison with prior tracing of 08/14/10, hemoglobin anteroseptal MI.  ASSESSMENT AND PLAN:

## 2010-09-28 NOTE — Assessment & Plan Note (Addendum)
Chest pain has worrisome characteristics, but was unassociated with activity and has not recurred over the past 5 days.  Occlusion of a bypass graft would be unusual 3 months after bypass surgery.  There are no acute changes on EKG, but at least one set of cardiac markers would've been helpful.  We will proceed with a pharmacologic stress echocardiogram to assess any recovery in LV function following revascularization and to rule out stress-induced ischemia.  Patient should be treated with sublingual nitroglycerin and transferred to [facility] emergency department if chest discomfort consistent with angina recurs.

## 2010-10-01 ENCOUNTER — Ambulatory Visit: Payer: Medicare Other | Admitting: Cardiology

## 2010-10-01 ENCOUNTER — Telehealth: Payer: Self-pay | Admitting: Cardiology

## 2010-10-01 ENCOUNTER — Encounter (INDEPENDENT_AMBULATORY_CARE_PROVIDER_SITE_OTHER): Payer: Self-pay | Admitting: Thoracic Surgery (Cardiothoracic Vascular Surgery)

## 2010-10-01 ENCOUNTER — Encounter: Payer: Medicare Other | Admitting: Thoracic Surgery (Cardiothoracic Vascular Surgery)

## 2010-10-01 DIAGNOSIS — I2510 Atherosclerotic heart disease of native coronary artery without angina pectoris: Secondary | ICD-10-CM

## 2010-10-01 DIAGNOSIS — Z09 Encounter for follow-up examination after completed treatment for conditions other than malignant neoplasm: Secondary | ICD-10-CM

## 2010-10-01 NOTE — Telephone Encounter (Signed)
[name], nurse at [facility], states that patient saw surgeon today, Dr.Owens.  He stated that patient was okay to have 2 D Echo but did not want patient to have Pharmacological Echo that is scheduled for 7/27.  Please advise. / tg

## 2010-10-01 NOTE — Assessment & Plan Note (Signed)
OFFICE VISIT  XAVIERA, FLATEN DOB:  1929-09-08                                        October 01, 2010 CHART #:  [id number]  HISTORY OF PRESENT ILLNESS:  The patient returns for followup status post emergency coronary artery bypass grafting x3 on Aug 09, 2010, for acute ST-segment elevation myocardial infarction with acute stent thrombosis and critical left main coronary artery disease, cardiogenic shock, pulmonary edema with respiratory failure.  The patient truly went to the operating room in extremis for emergency salvage operation.  Her postoperative recovery was expectedly slow, but she has done remarkably well under the circumstances.  She was ultimately discharged to the Skilled [facility], [facility] where she has been gradually regaining her strength.  She apparently was seen in followup by Dr. Swaziland, recently and she has been followed carefully by Dr. Alla Feeling at [facility].  The patient reports that she is gradually getting her strength back.  She is still a little bit wobbly on her feet and apparently still has some problems with balance, but overall she is slowly improving.  She complains that she still does not have much of an appetite.  She does note that her breathing is gradually getting better and she is no longer having any lower extremity edema.  The remainder of her review of systems is unremarkable.  She states that there was some question about having a pharmacologic stress test because of some atypical chest pain that she had not too long ago.  She did have a chest x-ray performed on September 14, 2010, that revealed overall improvement in her lung aeration with very mild residual congestive heart failure and small pleural effusions.  She returns to our office today for further followup.  PHYSICAL EXAM:  Notable for an elderly frail patient in no acute distress.  Blood pressure is 107/65, pulse 100 regular, oxygen saturation 95% on  room air.  Examination of the chest reveals a median sternotomy incision that is healing nicely.  Auscultation demonstrates clear breath sounds with a few bibasilar crackles.  Breath sounds are symmetrical.  Cardiovascular exam is notable for regular rate and rhythm.  No murmurs, rubs, or gallops are noted.  Abdomen is soft and nontender.  The extremities are warm and well perfused.  There is no lower extremity edema.  DIAGNOSTIC TEST:  Chest x-ray performed on September 20, 2010, at [facility] demonstrates very small bilateral pleural effusions and mild pulmonary edema, improved in comparison with the previous x-ray on September 14, 2010.  IMPRESSION:  The patient appears to be gradually improving overall.  PLAN:  I have encouraged the patient to continue to gradually increase her physical activity as tolerated.  Hopefully, she will become more functionally independent in the near future and be able to return home before too much longer.  We have not made any changes in her current medications.  I feel that it is probably a bit early after her recent surgery to consider pharmacologic stress.  A followup echocardiogram would be reasonable to assess her LV function.  She certainly suffered a massive myocardial infarction, and I would not be surprised if she has very significant residual LV dysfunction.  All of her questions have been addressed.  Salvatore Decent. [name], M.D. Electronically Signed  CHO/MEDQ  D:  10/01/2010  T:  10/01/2010  Job:  [id number]  cc:   Peter M. Swaziland, M.D. [name], MD,FACC Maxwell Caul, M.D. Kirstie Peri, MD Dr. Alla Fe[facility]

## 2010-10-02 NOTE — Telephone Encounter (Signed)
Case discussed with Dr. Barry Dienes.  He is concerned that patient had a very difficult postoperative course and a very large myocardial infarction and that stress testing might be inappropriate at present.  While he has some concern regarding the risk associated with the test, he also wonders whether the information derived would be of much value since it is unlikely that a percutaneous intervention would be appropriate and feasible if ischemia were demonstrated.  She certainly is not a candidate for repeat surgical intervention.  I agree that stress testing would most likely not affect [name] clinical course, but do not believe that there is significant risk in performing the study.  For now, we will proceed with an echocardiogram to determine if there has been recovery of left ventricular systolic function.  I will discuss the appropriate treatment of her chest discomfort with Dr. [facility].

## 2010-10-02 NOTE — Telephone Encounter (Signed)
Office note from Dr. [name] states too early for pharm stress echo.

## 2010-10-03 ENCOUNTER — Telehealth: Payer: Self-pay | Admitting: Cardiology

## 2010-10-03 NOTE — Telephone Encounter (Signed)
Patient had been referred to me for evaluation of chest pain by the staff at [facility]  Chest discomfort was atypical but not entirely inconsistent with myocardial ischemia.  A pharmacologic stress test was ordered, but Dr. Barry Dienes subsequently objected.  He is concerned that it is "too early" for a stress test.  Patient had a very difficult postoperative course, and he is not inclined to subject her to any additional physiologic stress.  While I do not agree that there is any risk associated with the stress test, I do concur that it is unlikely to change her therapy.  She is certainly not a candidate for additional surgery, and it is unlikely that percutaneous intervention would be necessary, feasible and provide her with benefit.  If we demonstrated ischemia, antianginal medications could be utilized more appropriately.  Nonetheless, the study will be canceled at Dr. Orvan July request.  I have call Dr. [facility], but failed to reach him.  Additional episodes of chest discomfort can be managed with a trial of sublingual nitroglycerin, medication to neutralize or suppress gastric acid if appropriate and mild analgesics.  An EKG during discomfort would be extremely helpful.  Patient can be sent to the emergency department if ischemia is strongly suspected.  She is scheduled for continuing cardiology followup with Dr. Andee Lineman.

## 2010-10-07 ENCOUNTER — Encounter: Payer: Self-pay | Admitting: Cardiology

## 2010-10-08 ENCOUNTER — Encounter (INDEPENDENT_AMBULATORY_CARE_PROVIDER_SITE_OTHER): Payer: Self-pay

## 2010-10-08 DIAGNOSIS — I2510 Atherosclerotic heart disease of native coronary artery without angina pectoris: Secondary | ICD-10-CM

## 2010-10-10 NOTE — Telephone Encounter (Signed)
Discussed with Dr. [facility].  He will monitor pt's symptoms, obtain EKG for recurrent pain, treat with NTG if appropriate and refer to ED if necessary.  Dr. Andee Lineman will follow in office.

## 2010-10-10 NOTE — Consult Note (Signed)
  NAMESASHA, ROGEL NO.:  [id number]  MEDICAL RECORD NO.:  [id number]  LOCATION:                                 FACILITY:  PHYSICIAN:  Graylin Shiver, M.D.   DATE OF BIRTH:  03-Jan-1930  DATE OF CONSULTATION:  08/31/2010 DATE OF DISCHARGE:                                CONSULTATION   REASON FOR CONSULTATION:  Diarrhea.  HISTORY:  The patient is a 77 year old female who had a colonoscopy done at [facility] last month and while she was in the recovery room after the colonoscopy started to develop chest pain.  She was subsequently sent for cardiac evaluation and was sent to [facility] for an emergency cath where she was then sent for emergency coronary artery bypass graft.  The patient states that her colonoscopy was done because she was having diarrhea.  The reason we are consulted is that she is still having diarrhea.  She thinks that her diarrhea is worse with feeding and she seems to have it worse after she eats.  Her stool was checked for Clostridium difficile toxin and it was negative.  PAST HISTORY: 1. Coronary artery disease. 2. Hypertension. 3. Hyperlipidemia. 4. Carotid disease.  ALLERGIES:  SULFA.  REVIEW OF SYSTEMS:  No current complaints of chest pain or shortness of breath.  PHYSICAL EXAMINATION:  GENERAL:  She does not appear in any acute distress, nonicteric. HEART:  Regular rhythm.  No murmurs. LUNGS:  Clear. ABDOMEN:  Soft, nontender.  No obvious hepatosplenomegaly.  IMPRESSION:  Diarrhea, etiology unclear.  PLAN:  I would recommend checking with the Pathology department over at [facility] to see if biopsies of her colon were obtained primarily in the situation of diarrhea with negative stools.  We would look for collagenase or microscopic colitis that would be the first of if.  If the biopsies are not done, then I think it would be reasonable to perhaps do a sigmoidoscopy with colon biopsies to see if she  has evidence of collagenase or microscopic colitis.  There did not appear to be anything else that would cause diarrhea based on the written report that I saw of her colonoscopy but I cannot really tell if they did any biopsies.  Her diarrhea could be aggravated by feeding as well but she was having this before the colonoscopy.  We will look into this matter further.          ______________________________ Graylin Shiver, M.D.     SFG/MEDQ  D:  08/31/2010  T:  09/01/2010  Job:  [id number]  cc:   Salvatore Decent. [name], M.D.  Electronically Signed by Herbert Moors MD on 10/10/2010 03:43:15 PM

## 2010-10-11 ENCOUNTER — Encounter: Payer: Self-pay | Admitting: Cardiology

## 2010-10-11 ENCOUNTER — Ambulatory Visit (INDEPENDENT_AMBULATORY_CARE_PROVIDER_SITE_OTHER): Payer: Medicare Other | Admitting: Cardiology

## 2010-10-11 VITALS — BP 97/63 | HR 62 | Resp 20 | Ht 63.0 in | Wt 119.8 lb

## 2010-10-11 DIAGNOSIS — I255 Ischemic cardiomyopathy: Secondary | ICD-10-CM

## 2010-10-11 DIAGNOSIS — I2589 Other forms of chronic ischemic heart disease: Secondary | ICD-10-CM

## 2010-10-11 DIAGNOSIS — I2510 Atherosclerotic heart disease of native coronary artery without angina pectoris: Secondary | ICD-10-CM

## 2010-10-11 DIAGNOSIS — I4891 Unspecified atrial fibrillation: Secondary | ICD-10-CM

## 2010-10-11 DIAGNOSIS — I480 Paroxysmal atrial fibrillation: Secondary | ICD-10-CM

## 2010-10-11 DIAGNOSIS — R0602 Shortness of breath: Secondary | ICD-10-CM

## 2010-10-11 DIAGNOSIS — Z951 Presence of aortocoronary bypass graft: Secondary | ICD-10-CM

## 2010-10-11 DIAGNOSIS — I6529 Occlusion and stenosis of unspecified carotid artery: Secondary | ICD-10-CM

## 2010-10-11 DIAGNOSIS — I5022 Chronic systolic (congestive) heart failure: Secondary | ICD-10-CM

## 2010-10-11 NOTE — Patient Instructions (Addendum)
   Echo in 3 months just before next office visit  Labs - need to be done in 10 days - CBC, CMET, & BNP Follow up in  3 months - see above

## 2010-10-12 ENCOUNTER — Ambulatory Visit (HOSPITAL_COMMUNITY): Payer: Medicare Other

## 2010-10-12 ENCOUNTER — Ambulatory Visit (HOSPITAL_COMMUNITY): Payer: Medicare Other | Attending: Cardiology

## 2010-10-12 ENCOUNTER — Other Ambulatory Visit (HOSPITAL_COMMUNITY): Payer: Medicare Other

## 2010-10-19 DIAGNOSIS — R079 Chest pain, unspecified: Secondary | ICD-10-CM | POA: Insufficient documentation

## 2010-10-19 NOTE — Assessment & Plan Note (Signed)
Patient has ischemic myopathy ejection fraction is now approximately -40%. She has an anterior scar. We'll repeat an echo in 3 months.

## 2010-10-19 NOTE — Assessment & Plan Note (Signed)
Stable carotid disease followed by Dr. Hart Rochester.

## 2010-10-19 NOTE — Progress Notes (Signed)
HPI The patient is a 77 year old female with history of large anterior wall myocardial infarction earlier this year. She required emergent PCI and urgent CABG. She had a difficult postoperative course. She was seen in the refill office earlier in July because of chest pain. A stress test was ordered but the patient declined this. She now presents for followup in his office. She states that she is doing better and has no chest pain. She has no shortness of breath orthopnea PND of note is however in June her BNP level is markedly elevated and she was anemic. She also has mild elevated creatinine. The patient also has carotid disease is followed by Dr. Hart Rochester. I performed a bedside echocardiogram today which showed an ejection fraction of 40% apical and anterior akinesis with scarring. The patient's blood pressure does run relatively low at 97/63 today. She denies any dizziness or weakness.  Allergies  Allergen Reactions  . Sulfonamide Derivatives     Current Outpatient Prescriptions on File Prior to Visit  Medication Sig Dispense Refill  . ALPRAZolam (XANAX) 0.25 MG tablet Take 0.25 mg by mouth every 6 (six) hours as needed.        Marland Kitchen aspirin 325 MG tablet Take 325 mg by mouth daily.        Marland Kitchen atorvastatin (LIPITOR) 40 MG tablet Take 40 mg by mouth daily.        . carvedilol (COREG) 12.5 MG tablet Take 1 tablet (12.5 mg total) by mouth 2 (two) times daily.  30 tablet  3  . diphenoxylate-atropine (LOMOTIL) 2.5-0.025 MG per tablet Take 1 tablet by mouth 3 (three) times daily as needed.        . furosemide (LASIX) 40 MG tablet Take 40 mg by mouth as directed. 1 and 1/2 tabs bid      . Lactobacillus (LACTINEX) PACK Take 1 each by mouth 2 (two) times daily.        . nitroGLYCERIN (NITROSTAT) 0.4 MG SL tablet Place 0.4 mg under the tongue every 5 (five) minutes as needed.        . Nutritional Supplements (ENLIVE PO) Take 1 Can by mouth 2 (two) times daily.        . pantoprazole (PROTONIX) 40 MG tablet  Take 40 mg by mouth daily.        Marland Kitchen spironolactone (ALDACTONE) 25 MG tablet Take 1 tablet (25 mg total) by mouth daily.  30 tablet  3  . traMADol (ULTRAM) 50 MG tablet Take 50-100 mg by mouth every 6 (six) hours as needed.        . valsartan (DIOVAN) 320 MG tablet Take 1 tablet (320 mg total) by mouth daily.  30 tablet  3    Past Medical History  Diagnosis Date  . Unspecified essential hypertension   . Edema   . Coronary atherosclerosis of native coronary artery     Past Surgical History  Procedure Date  . Tonsillectomy   . Colonoscopy 2012    Family History  Problem Relation Age of Onset  . Cancer Other   . Coronary artery disease Other     History   Social History  . Marital Status: Widowed    Spouse Name: N/A    Number of Children: N/A  . Years of Education: N/A   Occupational History  . Not on file.   Social History Main Topics  . Smoking status: Never Smoker   . Smokeless tobacco: Never Used  . Alcohol Use: No  . Drug  Use: Not on file  . Sexually Active: Not on file   Other Topics Concern  . Not on file   Social History Narrative   Regular exercise- yes    RUE:[id number] positives as outlined above. The remainder of the 18  point review of systems is negative  PHYSICAL EXAM BP 97/63  Pulse 62  Resp 20  Ht 5\' 3"  (1.6 m)  Wt 119 lb 12.8 oz (54.341 kg)  BMI 21.22 kg/m2  SpO2 90%  General: Well-developed, well-nourished in no distress Head: Normocephalic and atraumatic Eyes:PERRLA/EOMI intact, conjunctiva and lids normal Ears: No deformity or lesions Mouth:normal dentition, normal posterior pharynx Neck: Supple, no JVD.  No masses, thyromegaly or abnormal cervical nodes Lungs: Normal breath sounds bilaterally without wheezing.  Normal percussion Cardiac: regular rate and rhythm with normal S1 and S2, no S3 or S4.  PMI is normal.  No pathological murmurs Abdomen: Normal bowel sounds, abdomen is soft and nontender without masses, organomegaly or  hernias noted.  No hepatosplenomegaly MSK: Back normal, normal gait muscle strength and tone normal Vascular: Pulse is normal in all 4 extremities Extremities: No peripheral pitting edema Neurologic: Alert and oriented x 3 Skin: Intact without lesions or rashes Lymphatics: No significant adenopathy Psychologic: Normal affect   ECG: Not available  ASSESSMENT AND PLAN

## 2010-10-19 NOTE — Assessment & Plan Note (Signed)
Normal sinus rhythm

## 2010-10-19 NOTE — Assessment & Plan Note (Signed)
The patient has no recurrent chest pain. At this point early after surgery I would not favor a stress test. We'll follow closely with the patient next couple months.

## 2010-12-10 LAB — BASIC METABOLIC PANEL
BUN: 11
Calcium: 8.8
Creatinine, Ser: 1.03
GFR calc non Af Amer: 52 — ABNORMAL LOW
Glucose, Bld: 93

## 2010-12-10 LAB — CBC
HCT: 32.6 — ABNORMAL LOW
Hemoglobin: 11.3 — ABNORMAL LOW
MCHC: 34.6
MCV: 88.8
RDW: 12.7

## 2010-12-10 LAB — CARDIAC PANEL(CRET KIN+CKTOT+MB+TROPI)
CK, MB: 3.2
Relative Index: 1.9
Relative Index: 2.2
Total CK: 146
Troponin I: 0.02
Troponin I: 0.02

## 2010-12-12 ENCOUNTER — Encounter: Payer: Self-pay | Admitting: Cardiology

## 2010-12-14 ENCOUNTER — Ambulatory Visit (INDEPENDENT_AMBULATORY_CARE_PROVIDER_SITE_OTHER): Payer: Medicare Other | Admitting: Cardiology

## 2010-12-14 ENCOUNTER — Encounter: Payer: Self-pay | Admitting: Cardiology

## 2010-12-14 VITALS — BP 108/69 | HR 87 | Ht 63.0 in | Wt 122.0 lb

## 2010-12-14 DIAGNOSIS — I4891 Unspecified atrial fibrillation: Secondary | ICD-10-CM

## 2010-12-14 DIAGNOSIS — R609 Edema, unspecified: Secondary | ICD-10-CM

## 2010-12-14 DIAGNOSIS — I2589 Other forms of chronic ischemic heart disease: Secondary | ICD-10-CM

## 2010-12-14 DIAGNOSIS — I255 Ischemic cardiomyopathy: Secondary | ICD-10-CM

## 2010-12-14 DIAGNOSIS — I6529 Occlusion and stenosis of unspecified carotid artery: Secondary | ICD-10-CM

## 2010-12-14 DIAGNOSIS — I480 Paroxysmal atrial fibrillation: Secondary | ICD-10-CM

## 2010-12-14 DIAGNOSIS — I10 Essential (primary) hypertension: Secondary | ICD-10-CM

## 2010-12-14 NOTE — Patient Instructions (Signed)
Follow up as scheduled. Your physician recommends that you continue on your current medications as directed. Please refer to the Current Medication list given to you today. 

## 2010-12-14 NOTE — Assessment & Plan Note (Signed)
Ejection fraction has improved from 25 to 40%. The patient will remain on beta blocker and ACE inhibitor as well as spironolactone. We will closely follow her ejection fraction and repeat a limited echocardiogram to assess LV function to her next clinic visit.

## 2010-12-14 NOTE — Assessment & Plan Note (Signed)
Patient has a followup appointment scheduled with Dr. Arbie Cookey. Continue risk factor modification. The patient is on statin drug therapy

## 2010-12-14 NOTE — Progress Notes (Signed)
History of present illness: The patient is a 77 year old female with a history of large anterior wall myocardial infarction status post emergent PCI followed by coronary bypass grafting. I saw the patient in the office at the end of July and performed a bedside echocardiogram. Ejection fraction was 40% with apical And anteroapical akinesis with scarring. She had relatively low blood pressure at that time but was asymptomatic. She was in sinus rhythm. She's currently NYHA class IIb. She also has peripheral vascular disease and a followup appointment with Dr. Arbie Cookey. She's currently tolerating her medications and her blood pressure has improved. She does report some decrease in exercise tolerance and some shortness of breath on moderate exertion. She also still has some sternotomy related chest pain. She denies any palpitations presyncope or syncope. She is scheduled for blood work to be done by primary care physician October 8.  Allergies-social and family history: Previously reviewed and available in the chart  Past medical history: See chart and problem was below  Medications: Listed in chart  Review of systems: No syncope. No palpitations no melena or hematochezia no dysuria or frequency. No fever or chills  Physical examination: Vital signs listed below, pressure is 108/69 HEENT: Pupils isocoric, conjunctiva clear. Neck supple normal carotid upstroke and no definite carotid bruits. No thyromegaly nonnodular thyroid Lungs: Clear breath sounds bilaterally Heart: Regular rate and rhythm normal S1-S2 no pathological murmurs Abdomen: Soft and nontender Extremity exam: No cyanosis clubbing or edema Vascular exam: Dorsalis pedis and posterior tibial pulses are palpable and normal Neurologic: Nonfocal Psychiatric: Normal affect  12-lead electrocardiogram: Normal sinus rhythm, anterior infarct pattern old, right bundle-branch block

## 2010-12-14 NOTE — Assessment & Plan Note (Signed)
Patient remains in normal sinus rhythm. Currently no indication for oral anticoagulant therapy

## 2010-12-14 NOTE — Assessment & Plan Note (Signed)
Asymptomatic. 

## 2010-12-14 NOTE — Assessment & Plan Note (Signed)
Blood pressure relatively low but the patient is asymptomatic. I will make no changes in the lower blood pressure is stable in the setting of her cardiomyopathy and she needs to stay on her medications to prevent as much as possible left ventricular remodeling

## 2010-12-26 ENCOUNTER — Encounter: Payer: Self-pay | Admitting: Cardiology

## 2010-12-26 DIAGNOSIS — N289 Disorder of kidney and ureter, unspecified: Secondary | ICD-10-CM | POA: Insufficient documentation

## 2010-12-26 DIAGNOSIS — D649 Anemia, unspecified: Secondary | ICD-10-CM | POA: Insufficient documentation

## 2010-12-28 DIAGNOSIS — J942 Hemothorax: Secondary | ICD-10-CM | POA: Insufficient documentation

## 2010-12-31 ENCOUNTER — Encounter: Payer: Self-pay | Admitting: Thoracic Surgery (Cardiothoracic Vascular Surgery)

## 2010-12-31 ENCOUNTER — Ambulatory Visit (INDEPENDENT_AMBULATORY_CARE_PROVIDER_SITE_OTHER): Payer: Medicare Other | Admitting: Thoracic Surgery (Cardiothoracic Vascular Surgery)

## 2010-12-31 VITALS — BP 97/60 | HR 83 | Resp 18 | Ht 64.0 in | Wt 120.0 lb

## 2010-12-31 DIAGNOSIS — I2510 Atherosclerotic heart disease of native coronary artery without angina pectoris: Secondary | ICD-10-CM | POA: Insufficient documentation

## 2010-12-31 NOTE — Progress Notes (Signed)
PCP is Kirstie Peri, MD Referring Provider is Kirstie Peri, MD  Chief Complaint  Patient presents with  . Follow-up     3 MO emergency CABG 08/09/10    HPI:   Patient returns for followup now 4 months status post emergency coronary artery bypass grafting. She was last seen here in our office on 10/01/2010. Since then she has continued to do exceptionally well. She is now home and getting around remarkably well. She had home physical therapy for a while, but apparently the physical therapist told her that she no longer needs treatment. It does not sound as though she was ever referred for for cardiac rehabilitation. She has minimal residual soreness in her chest. She states that her anterior chest wall still feels a little tender to the touch, but otherwise it does not bother her. She does not have shortness of breath. She does not have exertional chest pain. Overall she has no complaints.  Past Medical History  Diagnosis Date  . Unspecified essential hypertension   . Edema   . Coronary atherosclerosis of native coronary artery   . Hemothorax on left     Past Surgical History  Procedure Date  . Tonsillectomy   . Colonoscopy 2012  . Sternal wound exploration, evacuation of left hemothorax,  and sternal wou nd closure 08/13/10    [facility]on  . Cabgx 3 08/09/10    [name]    Family History  Problem Relation Age of Onset  . Cancer Other   . Coronary artery disease Other     Social History History  Substance Use Topics  . Smoking status: Never Smoker   . Smokeless tobacco: Never Used  . Alcohol Use: No    Current Outpatient Prescriptions  Medication Sig Dispense Refill  . aspirin 325 MG tablet Take 325 mg by mouth daily.        Marland Kitchen atorvastatin (LIPITOR) 40 MG tablet Take 40 mg by mouth daily.        . carvedilol (COREG) 12.5 MG tablet Take 1 tablet (12.5 mg total) by mouth 2 (two) times daily.  30 tablet  3  . furosemide (LASIX) 40 MG tablet Take 40 mg by mouth daily.       .  pantoprazole (PROTONIX) 40 MG tablet Take 40 mg by mouth daily.        Marland Kitchen spironolactone (ALDACTONE) 25 MG tablet Take 1 tablet (25 mg total) by mouth daily.  30 tablet  3  . valsartan (DIOVAN) 160 MG tablet Take 160 mg by mouth daily.        Marland Kitchen ALPRAZolam (XANAX) 0.25 MG tablet Take 0.25 mg by mouth every 6 (six) hours as needed.        . megestrol (MEGACE) 40 MG tablet Take 1 tablet by mouth Twice daily.      . nitroGLYCERIN (NITROSTAT) 0.4 MG SL tablet Place 0.4 mg under the tongue every 5 (five) minutes as needed.          Allergies  Allergen Reactions  . Sulfonamide Derivatives Rash    Review of Systems  Constitutional: Negative.   HENT: Negative.   Eyes: Negative.   Respiratory: Negative.  Negative for chest tightness and shortness of breath.   Cardiovascular: Negative.  Negative for chest pain.  Gastrointestinal: Negative.   Musculoskeletal: Negative.   Neurological: Negative.   Hematological: Negative.   Psychiatric/Behavioral: Negative.     BP 97/60  Pulse 83  Resp 18  Ht 5\' 4"  (1.626 m)  Wt 120 lb (  54.432 kg)  BMI 20.60 kg/m2  SpO2 100% Physical Exam  Constitutional: She is oriented to person, place, and time. She appears well-developed and well-nourished.  Neck: Normal range of motion. Neck supple. No JVD present.  Cardiovascular: Normal rate, regular rhythm and normal heart sounds.   No murmur heard. Pulmonary/Chest: Effort normal and breath sounds normal. She has no wheezes. She has no rales.  Abdominal: Soft. Bowel sounds are normal.  Musculoskeletal: Normal range of motion. She exhibits no edema.  Neurological: She is alert and oriented to person, place, and time.  Skin: Skin is warm and dry.  Psychiatric: She has a normal mood and affect. Her behavior is normal. Judgment and thought content normal.     Impression:  The patient is doing remarkably well more than 4 months following emergency coronary artery bypass grafting.   Plan:  In the future the  patient will call and return to see [place] as needed. All of her questions have been addressed.

## 2010-12-31 NOTE — Patient Instructions (Signed)
Consider enrollment in the cardiac rehabilitation program.

## 2011-01-01 ENCOUNTER — Encounter: Payer: Self-pay | Admitting: *Deleted

## 2011-01-01 ENCOUNTER — Other Ambulatory Visit: Payer: Self-pay | Admitting: *Deleted

## 2011-01-01 DIAGNOSIS — D649 Anemia, unspecified: Secondary | ICD-10-CM

## 2011-01-01 DIAGNOSIS — R7989 Other specified abnormal findings of blood chemistry: Secondary | ICD-10-CM

## 2011-01-01 DIAGNOSIS — I10 Essential (primary) hypertension: Secondary | ICD-10-CM

## 2011-01-03 ENCOUNTER — Telehealth: Payer: Self-pay | Admitting: Cardiology

## 2011-01-03 ENCOUNTER — Encounter: Payer: Self-pay | Admitting: Cardiology

## 2011-01-08 NOTE — Telephone Encounter (Signed)
States she has it all straight now, received letter in the mail.

## 2011-01-11 ENCOUNTER — Ambulatory Visit (INDEPENDENT_AMBULATORY_CARE_PROVIDER_SITE_OTHER): Payer: Medicare Other | Admitting: *Deleted

## 2011-01-11 ENCOUNTER — Encounter: Payer: Self-pay | Admitting: *Deleted

## 2011-01-11 VITALS — BP 113/71 | HR 69 | Ht 63.0 in | Wt 125.0 lb

## 2011-01-11 DIAGNOSIS — I10 Essential (primary) hypertension: Secondary | ICD-10-CM

## 2011-01-11 NOTE — Progress Notes (Signed)
Patient presents to office today for nurse blood pressure check. Patient was informed to stop her fluid medications(spironolactone and valsartan) and have lab work after one week. Patient state's because she started having some fluid retention in both legs and feet, she restarted both fluid pills back on Wednesday(01/09/11),taking one each daily.

## 2011-03-20 ENCOUNTER — Encounter: Payer: Self-pay | Admitting: Cardiology

## 2011-03-20 ENCOUNTER — Ambulatory Visit (INDEPENDENT_AMBULATORY_CARE_PROVIDER_SITE_OTHER): Payer: Medicare Other | Admitting: Cardiology

## 2011-03-20 VITALS — BP 109/66 | HR 75 | Ht 63.0 in | Wt 125.0 lb

## 2011-03-20 DIAGNOSIS — I2510 Atherosclerotic heart disease of native coronary artery without angina pectoris: Secondary | ICD-10-CM

## 2011-03-20 DIAGNOSIS — R0602 Shortness of breath: Secondary | ICD-10-CM

## 2011-03-20 DIAGNOSIS — IMO0002 Reserved for concepts with insufficient information to code with codable children: Secondary | ICD-10-CM

## 2011-03-20 DIAGNOSIS — I5022 Chronic systolic (congestive) heart failure: Secondary | ICD-10-CM

## 2011-03-20 DIAGNOSIS — I480 Paroxysmal atrial fibrillation: Secondary | ICD-10-CM

## 2011-03-20 DIAGNOSIS — I779 Disorder of arteries and arterioles, unspecified: Secondary | ICD-10-CM

## 2011-03-20 DIAGNOSIS — I4891 Unspecified atrial fibrillation: Secondary | ICD-10-CM

## 2011-03-20 DIAGNOSIS — I6529 Occlusion and stenosis of unspecified carotid artery: Secondary | ICD-10-CM

## 2011-03-20 NOTE — Patient Instructions (Signed)
Your physician wants you to follow-up in: 6 months. You will receive a reminder letter in the mail one-two months in advance. If you don't receive a letter, please call our office to schedule the follow-up appointment. Your physician recommends that you continue on your current medications as directed. Please refer to the Current Medication list given to you today. Your physician recommends that you go to the [facility] for lab work: BMET (You can do same day as other tests.) Your physician has requested that you have an echocardiogram. Echocardiography is a painless test that uses sound waves to create images of your heart. It provides your doctor with information about the size and shape of your heart and how well your heart's chambers and valves are working. This procedure takes approximately one hour. There are no restrictions for this procedure. Your physician has requested that you have a carotid duplex. This test is an ultrasound of the carotid arteries in your neck. It looks at blood flow through these arteries that supply the brain with blood. Allow one hour for this exam. There are no restrictions or special instructions. If the results of your test are normal or stable, you will receive a letter. If they are abnormal, the nurse will contact you by phone.

## 2011-03-21 ENCOUNTER — Encounter: Payer: Self-pay | Admitting: Cardiology

## 2011-03-21 DIAGNOSIS — IMO0002 Reserved for concepts with insufficient information to code with codable children: Secondary | ICD-10-CM | POA: Insufficient documentation

## 2011-03-21 DIAGNOSIS — I2510 Atherosclerotic heart disease of native coronary artery without angina pectoris: Secondary | ICD-10-CM | POA: Insufficient documentation

## 2011-03-21 NOTE — Progress Notes (Signed)
Peyton Bottoms, MD, [facility] Board Certified in Adult Cardiovascular Medicine,Internal Medicine and Critical Care Medicine    CC: followup patient with history of coronary artery disease and emergent bypass surgery May 2012   HPI:  The patient is a 78 year old female with a history of coronary artery disease status post prior coronary interventions presenting with a large anterior wall myocardial infarction May 2012 requiring emergent bypass surgery.  Current NMA classes to be an ejection fraction is 40%.  She had transient atrial fibrillation postoperatively but this has resolved.  The patient is not on anticoagulation. She reports no orthopnea or PND palpitations or syncope.  She does report some left-sided "soreness" but no angina symptoms.  She is a sensation of numbness in both hands which appears to be a chronic problem and has been discussed with her primary care physician before over the last several years.  I suspect the patient has some element of Raynaud's phenomena. Overall she is recovering well from her emergent surgery with improvement in her level of activity.   PMH: reviewed and listed in Problem List in Electronic Records (and see below) Past Medical History  Diagnosis Date  . Unspecified essential hypertension   . Edema   . Coronary atherosclerosis of native coronary artery     echocardiogram June 2012 ejection fraction 40% with apical and anteroseptal akinesis with scarring.  . Hemothorax on left   . Myocardial infarction of anterior wall greater than eight weeks ago     large anterior wall myocardial infarction status post emergent PCI followed by coronary bypass grafting.,  Ejection fraction 40%  . Carotid artery disease     followed by vascular surgery  Carotid duplex in 02/2010-60-79% right proximal internal carotid artery stenosis; plaque without focal stenosis on the left; no change compared with 08/30/2009.  . Transient atrial fibrillation or flutter     post  myocardial infarction and she remains in normal sinus rhythm  . Coronary artery disease     Acute anteroseptal MI treated with urgent PCI and followed by CABG surgery for left main and severe three-vessel coronary disease in 07/2010; prior percutaneous intervention in 05/2007 with DES to the proximal LAD; RCA stent in 09/2009; both stents occluded as well as M1 prior to surgery   Past Surgical History  Procedure Date  . Tonsillectomy   . Colonoscopy 2012  . Sternal wound exploration, evacuation of left hemothorax,  and sternal wou nd closure 08/13/10    [facility]on  . Cabgx 3 08/09/10    [name]    Allergies/SH/FHX : available in Electronic Records for review  Allergies  Allergen Reactions  . Sulfonamide Derivatives Rash   History   Social History  . Marital Status: Widowed    Spouse Name: N/A    Number of Children: N/A  . Years of Education: N/A   Occupational History  . Not on file.   Social History Main Topics  . Smoking status: Never Smoker   . Smokeless tobacco: Never Used  . Alcohol Use: No  . Drug Use: Not on file  . Sexually Active: Not on file   Other Topics Concern  . Not on file   Social History Narrative   Regular exercise- yes    Family History  Problem Relation Age of Onset  . Cancer Other   . Coronary artery disease Other     Medications: Current Outpatient Prescriptions  Medication Sig Dispense Refill  . aspirin 325 MG tablet Take 325 mg by mouth  daily.        . atorvastatin (LIPITOR) 40 MG tablet Take 40 mg by mouth daily.        . carvedilol (COREG) 12.5 MG tablet Take 1 tablet (12.5 mg total) by mouth 2 (two) times daily.  30 tablet  3  . DIOVAN 160 MG tablet Take 1 tablet by mouth Daily.      . furosemide (LASIX) 40 MG tablet Take 20 mg by mouth daily.       . nitroGLYCERIN (NITROSTAT) 0.4 MG SL tablet Place 0.4 mg under the tongue every 5 (five) minutes as needed.        . pantoprazole (PROTONIX) 40 MG tablet Take 40 mg by mouth daily.        Marland Kitchen  spironolactone (ALDACTONE) 25 MG tablet Take 1 tablet by mouth Daily.        ROS: No nausea or vomiting. No fever or chills.No melena or hematochezia.No bleeding.No claudication  Physical Exam: BP 109/66  Pulse 75  Ht 5\' 3"  (1.6 m)  Wt 125 lb (56.7 kg)  BMI 22.14 kg/m2 General:well-nourished white female in no distress Neck:normal carotid upstroke and right-sided carotid bruit.  No thyromegaly.  No nodular thyroid JVP is 5 cm Lungs:clear breath sounds bilaterally without wheezing or crackles Cardiac:regular rate and rhythm with normal S1 and S2 no murmur rubs or gallops Vascular:no edema.  Normal distal pulses bilaterally Skin:warm and dry Physcologic:normal affect  12lead WUJ:WJXBJY sinus rhythm and right bundle branch block with anteroseptal Q waves Limited bedside ECHO:N/A   Patient Active Problem List  Diagnoses   DYSLIPIDEMIA   HYPERTENSION, UNSPECIFIED   CAROTID ARTERY DISEASE-followed by vascular surgery with right carotid bruit   EDEMA-stable   Ischemic cardiomyopathy-ejection fraction 40% echocardiogram June 2012   Arteriosclerotic cardiovascular disease (ASCVD)   Anemia-postoperative   Renal insufficiency-resolved   Hemothorax on left-status post sternal wound exploration   Transient atrial fibrillation or flutter-postoperative no recurrence   Coronary artery disease-status post anterior wall myocardial infarction and coronary artery bypass grafting/emergent May 2012, status post prior PCI 2009 as well as 2011  Abnormal EKG with right bundle branch block and anteroseptal Q waves    PLAN:   The patient is stable after her surgery several months ago.  She has some atypical left-sided chest pain but no angina.  She is gradually resuming daily activities.  She is in NYHA class IIb.  She does get short of breath when walking uphill.  Patient's ejection fraction will need to be followed closely.  Currently no indication for defibrillator.  She is also on  an appropriate medical regimen for heart failure including ARB, beta blocker and spironolactone.  Blood pressure is relatively low and we will hold off on further Titrating her medications.  Patient appears to be euvolemic on the current dose of Lasix and spironolactone but we will schedule a followup BMET to evaluate her potassium  Followup carotid Dopplers will be scheduled.  Followup echocardiogram is also being scheduled for followup on ejection fraction.

## 2011-03-27 ENCOUNTER — Encounter (INDEPENDENT_AMBULATORY_CARE_PROVIDER_SITE_OTHER): Payer: Medicare Other | Admitting: *Deleted

## 2011-03-27 ENCOUNTER — Other Ambulatory Visit (INDEPENDENT_AMBULATORY_CARE_PROVIDER_SITE_OTHER): Payer: Medicare Other | Admitting: *Deleted

## 2011-03-27 ENCOUNTER — Other Ambulatory Visit: Payer: Self-pay | Admitting: *Deleted

## 2011-03-27 DIAGNOSIS — I5022 Chronic systolic (congestive) heart failure: Secondary | ICD-10-CM

## 2011-03-27 DIAGNOSIS — R0602 Shortness of breath: Secondary | ICD-10-CM

## 2011-03-27 DIAGNOSIS — R7989 Other specified abnormal findings of blood chemistry: Secondary | ICD-10-CM

## 2011-03-27 DIAGNOSIS — I4891 Unspecified atrial fibrillation: Secondary | ICD-10-CM

## 2011-03-27 DIAGNOSIS — I6529 Occlusion and stenosis of unspecified carotid artery: Secondary | ICD-10-CM

## 2011-03-27 DIAGNOSIS — Z79899 Other long term (current) drug therapy: Secondary | ICD-10-CM

## 2011-03-27 DIAGNOSIS — E875 Hyperkalemia: Secondary | ICD-10-CM

## 2011-03-27 DIAGNOSIS — I2510 Atherosclerotic heart disease of native coronary artery without angina pectoris: Secondary | ICD-10-CM

## 2011-04-02 ENCOUNTER — Other Ambulatory Visit: Payer: Self-pay | Admitting: *Deleted

## 2011-04-02 DIAGNOSIS — I6521 Occlusion and stenosis of right carotid artery: Secondary | ICD-10-CM

## 2011-04-02 DIAGNOSIS — R9430 Abnormal result of cardiovascular function study, unspecified: Secondary | ICD-10-CM

## 2011-04-23 ENCOUNTER — Other Ambulatory Visit: Payer: Self-pay | Admitting: *Deleted

## 2011-04-30 ENCOUNTER — Ambulatory Visit (INDEPENDENT_AMBULATORY_CARE_PROVIDER_SITE_OTHER): Payer: Medicare Other | Admitting: Internal Medicine

## 2011-04-30 VITALS — BP 126/70 | HR 89 | Ht 65.0 in | Wt 125.8 lb

## 2011-04-30 DIAGNOSIS — I6529 Occlusion and stenosis of unspecified carotid artery: Secondary | ICD-10-CM

## 2011-04-30 DIAGNOSIS — I2589 Other forms of chronic ischemic heart disease: Secondary | ICD-10-CM

## 2011-04-30 DIAGNOSIS — I10 Essential (primary) hypertension: Secondary | ICD-10-CM

## 2011-04-30 DIAGNOSIS — I255 Ischemic cardiomyopathy: Secondary | ICD-10-CM

## 2011-04-30 NOTE — Assessment & Plan Note (Signed)
Her blood pressure is well controlled today. She will continue her current medical therapy and maintain a low-sodium diet. 

## 2011-04-30 NOTE — Assessment & Plan Note (Signed)
The patient is pending vascular surgery consultation. In my opinion, she would be an adequate surgical candidate.

## 2011-04-30 NOTE — Assessment & Plan Note (Signed)
I discussed the treatment options with the patient. Her ejection fraction has gone from 40% down to 30-35% despite maximal medical therapy. She would be a candidate for ICD implantation and I discussed the risk, goals, benefits, and expectations of the procedure with the patient and her family. She is considering her options. Prior to ICD implantation, I would like her to consider vascular surgery followup as she has a fairly tight right carotid stenosis and this may need surgical revascularization.

## 2011-04-30 NOTE — Patient Instructions (Addendum)
Patient needs referral to Dr Early to follow up CTA   Dr Ladona Ridgel wants patient to see surgeon prior to doing ICD

## 2011-04-30 NOTE — Progress Notes (Signed)
HPI [name] is referred today by Dr. Andee Lineman for consideration for prophylactic ICD implantation. The patient has an ischemic cardiomyopathy and status post myocardial infarction, with revascularization. She has an ejection fraction by echo of 30-35%. In addition, she has carotid vascular disease and has had a 70-80% carotid stenosis documented by ultrasound. She has class 1-2 heart failure symptoms. She denies syncope. She has never had sustained ventricular tachycardia documented. She does have hypertension. No peripheral edema. No recent changes in vision or strength. Allergies  Allergen Reactions  . Sulfonamide Derivatives Rash     Current Outpatient Prescriptions  Medication Sig Dispense Refill  . aspirin 325 MG tablet Take 325 mg by mouth daily.        Marland Kitchen atorvastatin (LIPITOR) 40 MG tablet Take 40 mg by mouth daily.        . carvedilol (COREG) 12.5 MG tablet Take 1 tablet (12.5 mg total) by mouth 2 (two) times daily.  30 tablet  3  . furosemide (LASIX) 40 MG tablet Take 20 mg by mouth daily.       . nitroGLYCERIN (NITROSTAT) 0.4 MG SL tablet Place 0.4 mg under the tongue every 5 (five) minutes as needed.        . pantoprazole (PROTONIX) 40 MG tablet Take 40 mg by mouth daily.        . valsartan (DIOVAN) 160 MG tablet Take 1 tablet (160 mg total) by mouth.         Past Medical History  Diagnosis Date  . Unspecified essential hypertension   . Edema   . Coronary atherosclerosis of native coronary artery     echocardiogram June 2012 ejection fraction 40% with apical and anteroseptal akinesis with scarring.  . Hemothorax on left   . Myocardial infarction of anterior wall greater than eight weeks ago     large anterior wall myocardial infarction status post emergent PCI followed by coronary bypass grafting.,  Ejection fraction 40%  . Carotid artery disease     followed by vascular surgery  Carotid duplex in 02/2010-60-79% right proximal internal carotid artery stenosis; plaque  without focal stenosis on the left; no change compared with 08/30/2009.  . Transient atrial fibrillation or flutter     post myocardial infarction and she remains in normal sinus rhythm  . Coronary artery disease     Acute anteroseptal MI treated with urgent PCI and followed by CABG surgery for left main and severe three-vessel coronary disease in 07/2010; prior percutaneous intervention in 05/2007 with DES to the proximal LAD; RCA stent in 09/2009; both stents occluded as well as M1 prior to surgery    ROS:   All systems reviewed and negative except as noted in the HPI.   Past Surgical History  Procedure Date  . Tonsillectomy   . Colonoscopy 2012  . Sternal wound exploration, evacuation of left hemothorax,  and sternal wou nd closure 08/13/10    [facility]  . Cabgx 3 08/09/10    [name]  . Cardiac catheterization 2001, 2008, 2009, 2012     Family History  Problem Relation Age of Onset  . Cancer Other   . Coronary artery disease Other   . Heart failure Father     chf     History   Social History  . Marital Status: Widowed    Spouse Name: N/A    Number of Children: N/A  . Years of Education: N/A   Occupational History  . Not on file.   Social History Main  Topics  . Smoking status: Never Smoker   . Smokeless tobacco: Never Used  . Alcohol Use: No  . Drug Use: Not on file  . Sexually Active: Not on file   Other Topics Concern  . Not on file   Social History Narrative   Regular exercise- yes      BP 126/70  Pulse 89  Ht 5\' 5"  (1.651 m)  Wt 57.063 kg (125 lb 12.8 oz)  BMI 20.93 kg/m2  Physical Exam:  Well appearing elderly woman, NAD HEENT: Unremarkable Neck:  No JVD, no thyromegally. Soft carotid bruit is present on the right. Lungs:  Clear with no wheezes, rales, or rhonchi. HEART:  Regular rate rhythm, no murmurs, no rubs, no clicks Abd:  soft, positive bowel sounds, no organomegally, no rebound, no guarding Ext:  2 plus pulses, no edema, no cyanosis, no  clubbing Skin:  No rashes no nodules Neuro:  CN II through XII intact, motor grossly intact.  Assess/Plan:

## 2011-05-02 ENCOUNTER — Other Ambulatory Visit: Payer: Self-pay | Admitting: *Deleted

## 2011-05-02 ENCOUNTER — Telehealth: Payer: Self-pay | Admitting: *Deleted

## 2011-05-02 DIAGNOSIS — R7989 Other specified abnormal findings of blood chemistry: Secondary | ICD-10-CM

## 2011-05-02 NOTE — Telephone Encounter (Signed)
Already discussed below with patient.

## 2011-05-02 NOTE — Telephone Encounter (Signed)
Message copied by Murriel Hopper on Thu May 02, 2011  1:57 PM ------      Message from: Deliah Boston      Created: Tue Apr 30, 2011  4:31 PM       Dr Ladona Ridgel wants patient to see Dr Early prior to ICD  Pt says you are doing the referral based on the CTA            Thanks      Tresa Endo

## 2011-05-16 ENCOUNTER — Telehealth: Payer: Self-pay | Admitting: *Deleted

## 2011-05-16 DIAGNOSIS — R609 Edema, unspecified: Secondary | ICD-10-CM

## 2011-05-16 NOTE — Telephone Encounter (Signed)
Patient informed. BMET order sent to [facility].

## 2011-05-16 NOTE — Telephone Encounter (Signed)
Message copied by Eustace Moore on Thu May 16, 2011  9:17 AM ------      Message from: Lewayne Bunting E      Created: Wed May 08, 2011  1:46 PM       Hold Valsartan and recheck BMET in one week. Creatinine up to 1.63- if not improved in one week will need to see nephrology.

## 2011-05-23 ENCOUNTER — Other Ambulatory Visit: Payer: Self-pay | Admitting: *Deleted

## 2011-05-23 DIAGNOSIS — I6529 Occlusion and stenosis of unspecified carotid artery: Secondary | ICD-10-CM

## 2011-05-30 ENCOUNTER — Telehealth: Payer: Self-pay | Admitting: *Deleted

## 2011-05-30 ENCOUNTER — Other Ambulatory Visit: Payer: Self-pay | Admitting: Cardiology

## 2011-05-30 DIAGNOSIS — I6521 Occlusion and stenosis of right carotid artery: Secondary | ICD-10-CM

## 2011-05-30 NOTE — Telephone Encounter (Signed)
No precert required for Medicare,BCBS [place]

## 2011-05-30 NOTE — Telephone Encounter (Signed)
CT ANGIO OF HEAD AND NECK WITH CONTRAST   BUN 19 / cREATININE 1.40 (labs done on 2-28 2013 @ [facility])  Wednesday, March 20th, 2013 @ [facility]

## 2011-06-04 ENCOUNTER — Other Ambulatory Visit: Payer: Self-pay | Admitting: *Deleted

## 2011-06-04 DIAGNOSIS — Z79899 Other long term (current) drug therapy: Secondary | ICD-10-CM

## 2011-06-04 DIAGNOSIS — R7989 Other specified abnormal findings of blood chemistry: Secondary | ICD-10-CM

## 2011-06-10 ENCOUNTER — Encounter: Payer: Self-pay | Admitting: *Deleted

## 2011-06-10 ENCOUNTER — Telehealth: Payer: Self-pay | Admitting: *Deleted

## 2011-06-10 ENCOUNTER — Other Ambulatory Visit: Payer: Self-pay | Admitting: *Deleted

## 2011-06-10 DIAGNOSIS — I6529 Occlusion and stenosis of unspecified carotid artery: Secondary | ICD-10-CM

## 2011-06-10 NOTE — Progress Notes (Signed)
Patient ID: [name], female   DOB: Apr 12, 1929, 78 y.o.   MRN: [id number]  Patient unable to have CTA of head & neck due to elevated creatinine.  Will go ahead with referrral to [facility].  Patient aware.

## 2011-06-10 NOTE — Telephone Encounter (Signed)
Message copied by Murriel Hopper on Mon Jun 10, 2011 10:14 AM ------      Message from: Lewayne Bunting E      Created: Thu Jun 06, 2011  1:34 PM       Labs stable

## 2011-06-10 NOTE — Telephone Encounter (Signed)
Patient notified and verbalized understanding. 

## 2011-06-13 ENCOUNTER — Other Ambulatory Visit: Payer: Self-pay

## 2011-06-13 ENCOUNTER — Telehealth: Payer: Self-pay | Admitting: Cardiology

## 2011-06-13 DIAGNOSIS — I6529 Occlusion and stenosis of unspecified carotid artery: Secondary | ICD-10-CM

## 2011-06-13 NOTE — Telephone Encounter (Signed)
Received telephone call from [facility]. Mrs. Finkbiner has an appointment with Dr. Darrick Penna On April 11th. The office will contact patient

## 2011-06-27 ENCOUNTER — Other Ambulatory Visit: Payer: Medicare Other

## 2011-06-27 ENCOUNTER — Encounter: Payer: Medicare Other | Admitting: Vascular Surgery

## 2011-07-15 ENCOUNTER — Encounter: Payer: Self-pay | Admitting: Vascular Surgery

## 2011-07-16 ENCOUNTER — Other Ambulatory Visit: Payer: Medicare Other

## 2011-07-16 ENCOUNTER — Ambulatory Visit (INDEPENDENT_AMBULATORY_CARE_PROVIDER_SITE_OTHER): Payer: Medicare Other | Admitting: Vascular Surgery

## 2011-07-16 ENCOUNTER — Encounter: Payer: Self-pay | Admitting: Vascular Surgery

## 2011-07-16 VITALS — BP 148/74 | HR 87 | Resp 22 | Ht 62.0 in | Wt 116.5 lb

## 2011-07-16 DIAGNOSIS — I6529 Occlusion and stenosis of unspecified carotid artery: Secondary | ICD-10-CM

## 2011-07-16 NOTE — Progress Notes (Signed)
The patient presents today for continued discussion of her asymptomatic carotid disease. She is right-handed. She has had a very eventful urine has since my last visit with her in December of 2011. Apparently she was having a colonoscopy under general anesthesia and had cardiac event related to this. She had what sounds like urgent coronary bypass grafting with a very prolonged recovery period he is back to near her baseline health. He specifically denies any focal neurologic deficits. She has had no amaurosis fugax, transient ischemic attack, or stroke. She does not have any lower trim the claudication type symptoms. She does apparently have renal insufficiency but I do not have any records of her renal function. She did undergo a recent duplex at an outlying vascular lab suggesting 60-79% right internal carotid R. stenosis in this range. This correlates with this study we have approximately 1 1/2 years ago.  Past Medical History  Diagnosis Date  . Unspecified essential hypertension   . Edema   . Coronary atherosclerosis of native coronary artery     echocardiogram June 2012 ejection fraction 40% with apical and anteroseptal akinesis with scarring.  . Hemothorax on left   . Myocardial infarction of anterior wall greater than eight weeks ago     large anterior wall myocardial infarction status post emergent PCI followed by coronary bypass grafting.,  Ejection fraction 40%  . Carotid artery disease     followed by vascular surgery  Carotid duplex in 02/2010-60-79% right proximal internal carotid artery stenosis; plaque without focal stenosis on the left; no change compared with 08/30/2009.  . Transient atrial fibrillation or flutter     post myocardial infarction and she remains in normal sinus rhythm  . Coronary artery disease     Acute anteroseptal MI treated with urgent PCI and followed by CABG surgery for left main and severe three-vessel coronary disease in 07/2010; prior percutaneous intervention  in 05/2007 with DES to the proximal LAD; RCA stent in 09/2009; both stents occluded as well as M1 prior to surgery    History  Substance Use Topics  . Smoking status: Never Smoker   . Smokeless tobacco: Never Used  . Alcohol Use: No    Family History  Problem Relation Age of Onset  . Cancer Other   . Coronary artery disease Other   . Heart failure Father     chf  . Hyperlipidemia Mother   . Hypertension Mother   . Aneurysm Mother     AAA  . Hyperlipidemia Sister   . Hypertension Sister   . Hyperlipidemia Son   . Hypertension Son   . Heart disease Son   . Peripheral vascular disease Son     AMPUTATION  . Diabetes Son     Allergies  Allergen Reactions  . Sulfonamide Derivatives Rash    Current outpatient prescriptions:aspirin 325 MG tablet, Take 325 mg by mouth daily.  , Disp: , Rfl: ;  atorvastatin (LIPITOR) 40 MG tablet, Take 40 mg by mouth daily.  , Disp: , Rfl: ;  carvedilol (COREG) 12.5 MG tablet, Take 1 tablet (12.5 mg total) by mouth 2 (two) times daily., Disp: 30 tablet, Rfl: 3;  furosemide (LASIX) 40 MG tablet, Take 20 mg by mouth daily. , Disp: , Rfl:  nitroGLYCERIN (NITROSTAT) 0.4 MG SL tablet, Place 0.4 mg under the tongue every 5 (five) minutes as needed.  , Disp: , Rfl: ;  pantoprazole (PROTONIX) 40 MG tablet, Take 40 mg by mouth daily.  , Disp: , Rfl: ;  valsartan (DIOVAN) 160 MG tablet, Take 1 tablet (160 mg total) by mouth., Disp: , Rfl: ;  valsartan (DIOVAN) 320 MG tablet, Take 160 mg by mouth daily.  , Disp: , Rfl:   BP 148/74  Pulse 87  Resp 22  Ht 5\' 2"  (1.575 m)  Wt 116 lb 8 oz (52.844 kg)  BMI 21.31 kg/m2  Body mass index is 21.31 kg/(m^2).     Physical exam a little well-developed well-nourished white female no acute distress. She is grossly intact neurologically. Carotid arteries without bruits bilaterally. She has 2+ radial and 2+ dorsalis pedis pulses bilaterally. Heart is regular rate and rhythm. Chest is clear bilaterally.  Impression and  plan: Asymptomatic right 60- 79% carotid stenosis. I reviewed symptoms of right brain neurologic events with the patient. She knows to notify us immediately should this occur. I think we do the majority of her carotid endarterectomy space on duplex alone in that she is below this threshold. We will see her again in our office with a carotid duplex at 6 month interval from her last duplex in January of 2013.

## 2011-09-20 ENCOUNTER — Ambulatory Visit: Payer: Medicare Other | Admitting: Cardiology

## 2011-10-14 ENCOUNTER — Encounter: Payer: Self-pay | Admitting: Neurosurgery

## 2011-10-15 ENCOUNTER — Encounter: Payer: Self-pay | Admitting: Neurosurgery

## 2011-10-15 ENCOUNTER — Ambulatory Visit (INDEPENDENT_AMBULATORY_CARE_PROVIDER_SITE_OTHER): Payer: Medicare Other | Admitting: Neurosurgery

## 2011-10-15 ENCOUNTER — Other Ambulatory Visit (INDEPENDENT_AMBULATORY_CARE_PROVIDER_SITE_OTHER): Payer: Medicare Other | Admitting: *Deleted

## 2011-10-15 VITALS — BP 143/74 | HR 70 | Resp 18 | Ht 62.0 in | Wt 123.5 lb

## 2011-10-15 DIAGNOSIS — I6529 Occlusion and stenosis of unspecified carotid artery: Secondary | ICD-10-CM

## 2011-10-15 NOTE — Addendum Note (Signed)
Addended by: Sharee Pimple on: 10/15/2011 02:13 PM   Modules accepted: Orders

## 2011-10-15 NOTE — Progress Notes (Signed)
[facility] Carotid Office Note  CC: Six-month carotid duplex for surveillance Referring Physician: Early  History of Present Illness: 78 year old female patient of Dr. Arbie Cookey followed for known carotid stenosis. The patient does have a significant cardiac history having had a CABG in the past. The patient currently denies any signs or symptoms of CVA, TIA, amaurosis fugax or neural deficit. The patient states she is afraid of having any more procedures do to the heart attack she had with her colonoscopy.  Past Medical History  Diagnosis Date  . Unspecified essential hypertension   . Edema   . Coronary atherosclerosis of native coronary artery     echocardiogram June 2012 ejection fraction 40% with apical and anteroseptal akinesis with scarring.  . Hemothorax on left   . Myocardial infarction of anterior wall greater than eight weeks ago     large anterior wall myocardial infarction status post emergent PCI followed by coronary bypass grafting.,  Ejection fraction 40%  . Carotid artery disease     followed by vascular surgery  Carotid duplex in 02/2010-60-79% right proximal internal carotid artery stenosis; plaque without focal stenosis on the left; no change compared with 08/30/2009.  . Transient atrial fibrillation or flutter     post myocardial infarction and she remains in normal sinus rhythm  . Coronary artery disease     Acute anteroseptal MI treated with urgent PCI and followed by CABG surgery for left main and severe three-vessel coronary disease in 07/2010; prior percutaneous intervention in 05/2007 with DES to the proximal LAD; RCA stent in 09/2009; both stents occluded as well as M1 prior to surgery  . CHF (congestive heart failure)     ROS: [x]  Positive   [ ]  Denies    General: [ ]  Weight loss, [ ]  Fever, [ ]  chills Neurologic: [ ]  Dizziness, [ ]  Blackouts, [ ]  Seizure [ ]  Stroke, [ ]  "Mini stroke", [ ]  Slurred speech, [ ]  Temporary blindness; [ ]   weakness in arms or legs, [ ]  Hoarseness Cardiac: [ ]  Chest pain/pressure, [ ]  Shortness of breath at rest [ ]  Shortness of breath with exertion, [ ]  Atrial fibrillation or irregular heartbeat Vascular: [ ]  Pain in legs with walking, [ ]  Pain in legs at rest, [ ]  Pain in legs at night,  [ ]  Non-healing ulcer, [ ]  Blood clot in vein/DVT,   Pulmonary: [ ]  Home oxygen, [ ]  Productive cough, [ ]  Coughing up blood, [ ]  Asthma,  [ ]  Wheezing Musculoskeletal:  [ ]  Arthritis, [ ]  Low back pain, [ ]  Joint pain Hematologic: [ ]  Easy Bruising, [ ]  Anemia; [ ]  Hepatitis Gastrointestinal: [ ]  Blood in stool, [ ]  Gastroesophageal Reflux/heartburn, [ ]  Trouble swallowing Urinary: [ ]  chronic Kidney disease, [ ]  on HD - [ ]  MWF or [ ]  TTHS, [ ]  Burning with urination, [ ]  Difficulty urinating Skin: [ ]  Rashes, [ ]  Wounds Psychological: [ ]  Anxiety, [ ]  Depression   Social History History  Substance Use Topics  . Smoking status: Never Smoker   . Smokeless tobacco: Never Used  . Alcohol Use: No    Family History Family History  Problem Relation Age of Onset  . Cancer Other   . Coronary artery disease Other   . Heart failure Father     chf  . Heart disease Father   . Hyperlipidemia Mother   . Hypertension Mother   . Aneurysm Mother     AAA  .  Heart disease Mother   . Hyperlipidemia Sister   . Hypertension Sister   . Heart disease Sister     Heart Disease before age 21  . Heart attack Sister   . Hyperlipidemia Son   . Hypertension Son   . Heart disease Son   . Peripheral vascular disease Son     AMPUTATION  . Diabetes Son     Amputation Left foot    Allergies  Allergen Reactions  . Sulfonamide Derivatives Rash    Current Outpatient Prescriptions  Medication Sig Dispense Refill  . aspirin 325 MG tablet Take 325 mg by mouth daily.        Marland Kitchen atorvastatin (LIPITOR) 40 MG tablet Take 40 mg by mouth daily.        . furosemide (LASIX) 40 MG tablet Take 20 mg by mouth daily.       .  nitroGLYCERIN (NITROSTAT) 0.4 MG SL tablet Place 0.4 mg under the tongue every 5 (five) minutes as needed.        . carvedilol (COREG) 12.5 MG tablet Take 1 tablet (12.5 mg total) by mouth 2 (two) times daily.  30 tablet  3  . pantoprazole (PROTONIX) 40 MG tablet Take 40 mg by mouth daily.        . valsartan (DIOVAN) 160 MG tablet Take 1 tablet (160 mg total) by mouth.      . valsartan (DIOVAN) 320 MG tablet Take 160 mg by mouth daily.          Physical Examination  Filed Vitals:   10/15/11 1216  BP: 143/74  Pulse: 70  Resp:     Body mass index is 22.59 kg/(m^2).  General:  WDWN in NAD Gait: Normal HEENT: WNL Eyes: Pupils equal Pulmonary: normal non-labored breathing , without Rales, rhonchi,  wheezing Cardiac: RRR, without  Murmurs, rubs or gallops; Abdomen: soft, NT, no masses Skin: no rashes, ulcers noted  Vascular Exam Pulses: 2+ radial pulses bilaterally Carotid bruits: Carotid pulses to auscultation on the left, bruit heard on the right Extremities without ischemic changes, no Gangrene , no cellulitis; no open wounds;  Musculoskeletal: no muscle wasting or atrophy   Neurologic: A&O X 3; Appropriate Affect ; SENSATION: normal; MOTOR FUNCTION:  moving all extremities equally. Speech is fluent/normal  Non-Invasive Vascular Imaging CAROTID DUPLEX 10/15/2011  Right ICA 60 - 79 % stenosis Left ICA 20 - 39 % stenosis   ASSESSMENT/PLAN: Asymptomatic patient with high-grade right ICA stenosis. Dr. Arbie Cookey reviewed the duplex and spoke with the patient at length. We did offer a CT angiogram of the neck however the patient has renal issues which excludes her from a dye study. After further discussion, Dr. Arbie Cookey has decided to follow the patient on six-month rotation with carotid duplex. The patient's in agreement with this, her questions were encouraged and answered.  Lauree Chandler ANP  Clinic MD: Early

## 2011-10-22 NOTE — Procedures (Unsigned)
CAROTID DUPLEX EXAM  INDICATION:  Followup carotid disease.  HISTORY: Diabetes:  No Cardiac:  Yes Hypertension:  Yes Smoking:  No Previous Surgery:  No CV History: Amaurosis Fugax No, Paresthesias No, Hemiparesis No                                      RIGHT                LEFT Brachial systolic pressure:         122                  124 Brachial Doppler waveforms:         WNL                  WNL Vertebral direction of flow:        Antegrade            Antegrade DUPLEX VELOCITIES (cm/sec) CCA peak systolic                   62                   73 ECA peak systolic                   93                   177 ICA peak systolic                   380                  81 ICA end diastolic                   114                  23 PLAQUE MORPHOLOGY:                  Heterogeneous/calcific                 Heterogeneous PLAQUE AMOUNT:                      Moderate to severe   Mild PLAQUE LOCATION:                    ICA                  ICA/ECA  IMPRESSION: 1. High end 60-79% / borderline 80-99% right ICA stenosis.  Highest     end diastolic velocity recorded as 562 cm/sec, however this could     not be repeated with further evaluation. 2. 1-39% left ICA stenosis. 3. Bilateral vertebral arteries are antegrade.  ___________________________________________ Larina Earthly, M.D.  LT/MEDQ  D:  10/16/2011  T:  10/16/2011  Job:  [id number]

## 2011-10-25 ENCOUNTER — Encounter: Payer: Self-pay | Admitting: Cardiovascular Disease

## 2011-10-25 ENCOUNTER — Ambulatory Visit (INDEPENDENT_AMBULATORY_CARE_PROVIDER_SITE_OTHER): Payer: Medicare Other | Admitting: Cardiovascular Disease

## 2011-10-25 VITALS — BP 116/68 | HR 70 | Ht 62.0 in | Wt 123.0 lb

## 2011-10-25 DIAGNOSIS — I480 Paroxysmal atrial fibrillation: Secondary | ICD-10-CM

## 2011-10-25 DIAGNOSIS — I2589 Other forms of chronic ischemic heart disease: Secondary | ICD-10-CM

## 2011-10-25 DIAGNOSIS — I255 Ischemic cardiomyopathy: Secondary | ICD-10-CM

## 2011-10-25 DIAGNOSIS — I4891 Unspecified atrial fibrillation: Secondary | ICD-10-CM

## 2011-10-25 DIAGNOSIS — I6529 Occlusion and stenosis of unspecified carotid artery: Secondary | ICD-10-CM

## 2011-10-25 DIAGNOSIS — I779 Disorder of arteries and arterioles, unspecified: Secondary | ICD-10-CM

## 2011-10-25 DIAGNOSIS — I10 Essential (primary) hypertension: Secondary | ICD-10-CM

## 2011-10-25 DIAGNOSIS — E785 Hyperlipidemia, unspecified: Secondary | ICD-10-CM

## 2011-10-25 LAB — BASIC METABOLIC PANEL
Chloride: 104 mEq/L (ref 96–112)
Creat: 1.44 mg/dL — ABNORMAL HIGH (ref 0.50–1.10)
Potassium: 4.9 mEq/L (ref 3.5–5.3)

## 2011-10-25 NOTE — Assessment & Plan Note (Signed)
Maint NSR with no palpitations  

## 2011-10-25 NOTE — Assessment & Plan Note (Signed)
Stable with no angina and good activity level.  Continue medical Rx Needs F/U with Dr Ladona Ridgel after carotid issues resoved to place AICD

## 2011-10-25 NOTE — Assessment & Plan Note (Signed)
Have ordered CTA  Unless Dr Arbie Cookey thinks there is a high bifurcation I am not sure why CEA not done already.  Will let [facility] know not to cance CTA and give dye unless Cr over 2.  Hold Lasix 48 hours before CTA

## 2011-10-25 NOTE — Assessment & Plan Note (Signed)
Well controlled.  Continue current medications and low sodium Dash type diet.    

## 2011-10-25 NOTE — Patient Instructions (Addendum)
Non-Cardiac CT Angiography (CTA), is a special type of CT scan that uses a computer to produce multi-dimensional views of major blood vessels throughout the body. In CT angiography, a contrast material is injected through an IV to help visualize the blood vessels.  2 days prior to the CT Scan, you should not take your Lasix.  Your physician recommends that you schedule a follow-up appointment in: 2-3 weeks with Dr. Andee Lineman, after CTA.  Labs today:  BMET

## 2011-10-25 NOTE — Assessment & Plan Note (Signed)
Cholesterol is at goal.  Continue current dose of statin and diet Rx.  No myalgias or side effects.  F/U  LFT's in 6 months. No results found for this basename: LDLCALC             

## 2011-10-25 NOTE — Progress Notes (Signed)
Patient ID: [name], female   DOB: [birth year], 78 y.o.   MRN: [id number] The patient is a 78 year old female with a history of coronary artery disease status post prior coronary interventions presenting with a large anterior wall myocardial infarction May 2012 requiring emergent bypass surgery. Ejection fraction is 40%. She had transient atrial fibrillation postoperatively but this has resolved. The patient is not on anticoagulation.  She reports no orthopnea or PND palpitations or syncope. She does report some left-sided "soreness" but no angina symptoms. She is a sensation of numbness in both hands which appears to be a chronic problem and has been discussed with her primary care physician before over the last several years. I suspect the patient has some element of Raynaud's phenomena.   She had F/U EF evaluation that dropped to 30-35%  Seen by Dr Ladona Ridgel 04/30/11  Felt AICD appropriate but she had carotid disease that he wanted taken care of first.  Patient has tight  Carotid.  Reviewed report from 10/15/11  Systolic velocity over 3.8 and diastolic 1.14.  80-99% stenosis.  Seen by Dr Arbie Cookey.  For some reason surgery not advised.  She was to have CTA at [facility] but this was not done due to Cr. Reviewed labs and last Cr 1.5 in March.  GFR 32 and BUN 21  She is on daily lasix and is not a diabetic  I tried to call Dr Arbie Cookey and he is on vacation.  Discussed with patient.  She has significant risk of stroke and is postponing AICD both of which are significant health issues.  She should have her CTA risk of dye is minimal at Cr 1.5 with no diabetes.  Then F/U with Dr Early for CEA   ROS: Denies fever, malais, weight loss, blurry vision, decreased visual acuity, cough, sputum, SOB, hemoptysis, pleuritic pain, palpitaitons, heartburn, abdominal pain, melena, lower extremity edema, claudication, or rash.  All other systems reviewed and negative  General: Affect appropriate Frail elderly female HEENT:  normal Neck supple with no adenopathy JVP normal bilateral  bruits no thyromegaly Lungs clear with no wheezing and good diaphragmatic motion Heart:  S1/S2 no murmur, no rub, gallop or click PMI normal Abdomen: benighn, BS positve, no tenderness, no AAA no bruit.  No HSM or HJR Distal pulses intact with no bruits No edema Neuro non-focal Skin warm and dry No muscular weakness   Current Outpatient Prescriptions  Medication Sig Dispense Refill  . aspirin 325 MG tablet Take 325 mg by mouth daily.        Marland Kitchen atorvastatin (LIPITOR) 40 MG tablet Take 40 mg by mouth daily.        . carvedilol (COREG) 12.5 MG tablet Take 1 tablet (12.5 mg total) by mouth 2 (two) times daily.  30 tablet  3  . furosemide (LASIX) 40 MG tablet Take 20 mg by mouth daily.       . nitroGLYCERIN (NITROSTAT) 0.4 MG SL tablet Place 0.4 mg under the tongue every 5 (five) minutes as needed.        . pantoprazole (PROTONIX) 40 MG tablet Take 40 mg by mouth daily.          Allergies  Sulfonamide derivatives  Electrocardiogram:  NSR rate 70 RBBB LAFB  Assessment and Plan

## 2011-11-11 ENCOUNTER — Other Ambulatory Visit: Payer: Self-pay | Admitting: Cardiovascular Disease

## 2011-11-12 ENCOUNTER — Ambulatory Visit (INDEPENDENT_AMBULATORY_CARE_PROVIDER_SITE_OTHER): Payer: Medicare Other | Admitting: Cardiology

## 2011-11-12 ENCOUNTER — Encounter: Payer: Self-pay | Admitting: Cardiology

## 2011-11-12 ENCOUNTER — Telehealth: Payer: Self-pay

## 2011-11-12 VITALS — BP 125/77 | HR 66 | Ht 62.0 in | Wt 124.0 lb

## 2011-11-12 DIAGNOSIS — I6521 Occlusion and stenosis of right carotid artery: Secondary | ICD-10-CM

## 2011-11-12 DIAGNOSIS — I255 Ischemic cardiomyopathy: Secondary | ICD-10-CM

## 2011-11-12 DIAGNOSIS — I4891 Unspecified atrial fibrillation: Secondary | ICD-10-CM

## 2011-11-12 DIAGNOSIS — J90 Pleural effusion, not elsewhere classified: Secondary | ICD-10-CM

## 2011-11-12 DIAGNOSIS — J942 Hemothorax: Secondary | ICD-10-CM

## 2011-11-12 DIAGNOSIS — I2510 Atherosclerotic heart disease of native coronary artery without angina pectoris: Secondary | ICD-10-CM

## 2011-11-12 DIAGNOSIS — Q283 Other malformations of cerebral vessels: Secondary | ICD-10-CM

## 2011-11-12 DIAGNOSIS — Z79899 Other long term (current) drug therapy: Secondary | ICD-10-CM

## 2011-11-12 DIAGNOSIS — N289 Disorder of kidney and ureter, unspecified: Secondary | ICD-10-CM

## 2011-11-12 DIAGNOSIS — I7090 Unspecified atherosclerosis: Secondary | ICD-10-CM

## 2011-11-12 DIAGNOSIS — I480 Paroxysmal atrial fibrillation: Secondary | ICD-10-CM

## 2011-11-12 DIAGNOSIS — I6529 Occlusion and stenosis of unspecified carotid artery: Secondary | ICD-10-CM

## 2011-11-12 DIAGNOSIS — I2589 Other forms of chronic ischemic heart disease: Secondary | ICD-10-CM

## 2011-11-12 NOTE — Assessment & Plan Note (Signed)
Increase of creatinine to 1.89 with Diovan and spironolactone.

## 2011-11-12 NOTE — Assessment & Plan Note (Signed)
Blood pressure well controlled

## 2011-11-12 NOTE — Assessment & Plan Note (Signed)
No heart failure symptoms. Patient is currently on Lasix and she feels that her symptoms of dyspnea have much improved. Of note is that she's not on Diovan and spironolactone anymore possibly related to hyperkalemia and worsening renal insufficiency.

## 2011-11-12 NOTE — Telephone Encounter (Signed)
CT Angio Neck & head with contrast. Scheduled for 11-20-2011 [facility]

## 2011-11-12 NOTE — Progress Notes (Signed)
[name] Bottoms, MD, [facility] ABIM Board Certified in Adult Cardiovascular Medicine,Internal Medicine and Critical Care Medicine    CC:     Followup patient carotid artery disease, coronary artery disease and LV dysfunction                                                                             HPI:        Patient reports that her shortness of breath has improved. She has been placed on Lasix. Diovan in spironolactone was discontinued because of worsening renal insufficiency with creatinine up to 1.89. A year ago at patient's creatinine was 0.9 in 2 weeks ago was 1.44. The patient was referred to the EP service for ICD placement post MI with LV dysfunction. However the patient also had critical internal carotid artery disease and procedure was deferred until this was further dressed. CTA scan was requested the procedure was canceled because of increased creatinine. Patient was not rescheduled. From a cardiac standpoint is stable. She denies any chest pain orthopnea PND. I told the patient that we could proceed with CTA in addition to a bicarbonate protocol. I also told her that I want her to see Dr. Ladona Ridgel after surgical intervention of her carotid disease to proceed with implantation of a defibrillator. I feel the patient is at high risk given her anterior wall myocardial infarction and worsening LV function.  PMH: reviewed and listed in Problem List in Electronic Records (and see below) Past Medical History  Diagnosis Date  . Unspecified essential hypertension   . Edema   . Coronary atherosclerosis of native coronary artery     echocardiogram June 2012 ejection fraction 40% with apical and anteroseptal akinesis with scarring.  . Hemothorax on left   . Myocardial infarction of anterior wall greater than eight weeks ago     large anterior wall myocardial infarction status post emergent PCI followed by coronary bypass grafting.,  Ejection fraction 40%  . Carotid artery disease     followed by  vascular surgery  Carotid duplex in 02/2010-60-79% right proximal internal carotid artery stenosis; plaque without focal stenosis on the left; no change compared with 08/30/2009.  . Transient atrial fibrillation or flutter     post myocardial infarction and she remains in normal sinus rhythm  . Coronary artery disease     Acute anteroseptal MI treated with urgent PCI and followed by CABG surgery for left main and severe three-vessel coronary disease in 07/2010; prior percutaneous intervention in 05/2007 with DES to the proximal LAD; RCA stent in 09/2009; both stents occluded as well as M1 prior to surgery  . CHF (congestive heart failure)    Past Surgical History  Procedure Date  . Tonsillectomy   . Colonoscopy 2012  . Sternal wound exploration, evacuation of left hemothorax,  and sternal wou nd closure 08/13/10    [facility]rickson  . Cabgx 3 08/09/10    [name]  . Cardiac catheterization 2001, 2008, 2009, 2012  . Pr vein bypass graft,aorto-fem-pop 07/29/10    Allergies/SH/FHX : available in Electronic Records for review  Allergies  Allergen Reactions  . Sulfonamide Derivatives Rash   History   Social History  . Marital Status: Widowed  Spouse Name: N/A    Number of Children: N/A  . Years of Education: N/A   Occupational History  . Not on file.   Social History Main Topics  . Smoking status: Never Smoker   . Smokeless tobacco: Never Used  . Alcohol Use: No  . Drug Use: No  . Sexually Active: Not on file   Other Topics Concern  . Not on file   Social History Narrative   Regular exercise- yes    Family History  Problem Relation Age of Onset  . Cancer Other   . Coronary artery disease Other   . Heart failure Father     chf  . Heart disease Father   . Hyperlipidemia Mother   . Hypertension Mother   . Aneurysm Mother     AAA  . Heart disease Mother   . Hyperlipidemia Sister   . Hypertension Sister   . Heart disease Sister     Heart Disease before age 62  . Heart attack  Sister   . Hyperlipidemia Son   . Hypertension Son   . Heart disease Son   . Peripheral vascular disease Son     AMPUTATION  . Diabetes Son     Amputation Left foot    Medications: Current Outpatient Prescriptions  Medication Sig Dispense Refill  . aspirin 325 MG tablet Take 325 mg by mouth daily.        Marland Kitchen atorvastatin (LIPITOR) 40 MG tablet Take 40 mg by mouth daily.       . carvedilol (COREG) 12.5 MG tablet Take 1 tablet (12.5 mg total) by mouth 2 (two) times daily.  30 tablet  3  . furosemide (LASIX) 40 MG tablet Take 20-40 mg by mouth daily.       . nitroGLYCERIN (NITROSTAT) 0.4 MG SL tablet Place 0.4 mg under the tongue every 5 (five) minutes as needed.        . pantoprazole (PROTONIX) 40 MG tablet Take 40 mg by mouth daily.          ROS: No nausea or vomiting. No fever or chills.No melena or hematochezia.No bleeding.No claudication  Physical Exam: BP 125/77  Pulse 66  Ht 5\' 2"  (1.575 m)  Wt 124 lb (56.246 kg)  BMI 22.68 kg/m2  SpO2 97% General: Well-nourished white female in no distress Neck: Normal carotid stroke harsh right carotid bruit. No thyromegaly nonnodular thyroid. JVP 6-7 cm Lungs: Clear breath sounds bilaterally without wheezing Cardiac: Regular rate and rhythm with normal S1-S2 no murmur rubs or gallops Vascular: No edema. Normal distal pulses Skin: Warm and dry Physcologic: Normal affect   12lead ECG: Not obtained Limited bedside ECHO:N/A No images are attached to the encounter.   I reviewed and summarized the old records. I reviewed ECG and prior blood work.  Assessment and Plan  Arteriosclerotic cardiovascular disease (ASCVD) Patient doing well from a cardiovascular perspective she denies any chest pain. Ejection fraction however is 30-35% and patient will need further followup with the EP service. However Dr. Ladona Ridgel wanted right internal carotid artery disease taken care of first.  CAROTID ARTERY DISEASE Repeat carotid Doppler show 80-99%  right internal carotid artery stenosis, 1-39% left internal carotid artery stenosis. Patient was originally scheduled for CT of the neck but was canceled due to renal insufficiency. Unfortunately no further followup was provided. I rescheduled the patient's CT scan with contrast of the neck and she will be administered a bicarbonate protocol initial IV bolus 3 mL per kilogram per hour  x1 hour, followed by sodium bicarbonate 1 mL per kilogram per hour during contrast exposure and 6 hours after the procedure. Arrangements have been made with radiology. Creatinine was 1.44 we'll followup with a creatinine 2 days after procedure also. I discussed risks and benefits of the procedure with the patient and she is willing to proceed. We will also hold her Lasix a day of the procedure. Note also that she's not on Diovan and spironolactone anymore I suspect because of hyperkalemia and increasing creatinine although I have to further evaluate this  Hemothorax on left Blood pressure well controlled.  Paroxysmal atrial fibrillation No recurrent atrial fibrillation the patient has not been placed on Coumadin.  Ischemic cardiomyopathy No heart failure symptoms. Patient is currently on Lasix and she feels that her symptoms of dyspnea have much improved. Of note is that she's not on Diovan and spironolactone anymore possibly related to hyperkalemia and worsening renal insufficiency.  Renal insufficiency Increase of creatinine to 1.89 with Diovan and spironolactone.    Patient Active Problem List  Diagnosis  . DYSLIPIDEMIA  . HYPERTENSION, UNSPECIFIED  . CAROTID ARTERY DISEASE  . EDEMA  . Ischemic cardiomyopathy  . Paroxysmal atrial fibrillation  . Arteriosclerotic cardiovascular disease (ASCVD)  . Anemia  . Renal insufficiency  . Hemothorax on left  . Transient atrial fibrillation or flutter  . Coronary artery disease

## 2011-11-12 NOTE — Assessment & Plan Note (Addendum)
Patient doing well from a cardiovascular perspective she denies any chest pain. Ejection fraction however is 30-35% and patient will need further followup with the EP service. However Dr. Ladona Ridgel wanted right internal carotid artery disease taken care of first.

## 2011-11-12 NOTE — Assessment & Plan Note (Signed)
Repeat carotid Doppler show 80-99% right internal carotid artery stenosis, 1-39% left internal carotid artery stenosis. Patient was originally scheduled for CT of the neck but was canceled due to renal insufficiency. Unfortunately no further followup was provided. I rescheduled the patient's CT scan with contrast of the neck and she will be administered a bicarbonate protocol initial IV bolus 3 mL per kilogram per hour x1 hour, followed by sodium bicarbonate 1 mL per kilogram per hour during contrast exposure and 6 hours after the procedure. Arrangements have been made with radiology. Creatinine was 1.44 we'll followup with a creatinine 2 days after procedure also. I discussed risks and benefits of the procedure with the patient and she is willing to proceed. We will also hold her Lasix a day of the procedure. Note also that she's not on Diovan and spironolactone anymore I suspect because of hyperkalemia and increasing creatinine although I have to further evaluate this

## 2011-11-12 NOTE — Telephone Encounter (Signed)
Pt has Medicare and BCBS MCR supplement.  No precert required. °

## 2011-11-12 NOTE — Patient Instructions (Addendum)
   CT Angiogram of head & neck - HOLD LASIX DAY OF PROCEDURE  Lab:  BMET - do 2 days after CT  Office will contact with results Follow up in  1 month

## 2011-11-12 NOTE — Assessment & Plan Note (Signed)
No recurrent atrial fibrillation the patient has not been placed on Coumadin.

## 2011-11-19 ENCOUNTER — Encounter: Payer: Self-pay | Admitting: Physician Assistant

## 2011-11-21 ENCOUNTER — Other Ambulatory Visit: Payer: Self-pay | Admitting: *Deleted

## 2011-11-21 DIAGNOSIS — I6521 Occlusion and stenosis of right carotid artery: Secondary | ICD-10-CM

## 2011-11-27 ENCOUNTER — Encounter: Payer: Self-pay | Admitting: *Deleted

## 2011-12-02 ENCOUNTER — Encounter: Payer: Self-pay | Admitting: Vascular Surgery

## 2011-12-03 ENCOUNTER — Ambulatory Visit (INDEPENDENT_AMBULATORY_CARE_PROVIDER_SITE_OTHER): Payer: Medicare Other | Admitting: Vascular Surgery

## 2011-12-03 ENCOUNTER — Encounter: Payer: Self-pay | Admitting: Vascular Surgery

## 2011-12-03 VITALS — BP 146/72 | HR 75 | Resp 16 | Ht 62.0 in | Wt 121.7 lb

## 2011-12-03 DIAGNOSIS — I6529 Occlusion and stenosis of unspecified carotid artery: Secondary | ICD-10-CM

## 2011-12-03 NOTE — Progress Notes (Signed)
Patient presents for followup of her right carotid stenosis. She reports she has had no new cardiac difficulties since her emergent coronary bypass grafting in May of 2012. She specifically denies any new neurologic deficits. No amaurosis fugax transient ischemic attack or stroke. He had a recent CT angiogram in [facility] and I have reviewed the actual films of this. This does show a critical stenosis in her internal carotid artery at the bifurcation. The internal carotid distal this and the common carotid below have minimal atherosclerotic change. She does not have a critical stenosis on the left carotid. She is right-handed.  Past Medical History  Diagnosis Date  . Unspecified essential hypertension   . Edema   . Coronary atherosclerosis of native coronary artery     echocardiogram June 2012 ejection fraction 40% with apical and anteroseptal akinesis with scarring.  . Hemothorax on left   . Myocardial infarction of anterior wall greater than eight weeks ago     large anterior wall myocardial infarction status post emergent PCI followed by coronary bypass grafting.,  Ejection fraction 40%  . Carotid artery disease     followed by vascular surgery  Carotid duplex in 02/2010-60-79% right proximal internal carotid artery stenosis; plaque without focal stenosis on the left; no change compared with 08/30/2009.  . Transient atrial fibrillation or flutter     post myocardial infarction and she remains in normal sinus rhythm  . Coronary artery disease     Acute anteroseptal MI treated with urgent PCI and followed by CABG surgery for left main and severe three-vessel coronary disease in 07/2010; prior percutaneous intervention in 05/2007 with DES to the proximal LAD; RCA stent in 09/2009; both stents occluded as well as M1 prior to surgery  . CHF (congestive heart failure)     History  Substance Use Topics  . Smoking status: Never Smoker   . Smokeless tobacco: Never Used  . Alcohol Use: No     Family History  Problem Relation Age of Onset  . Cancer Other   . Coronary artery disease Other   . Heart failure Father     chf  . Heart disease Father   . Hyperlipidemia Mother   . Hypertension Mother   . Aneurysm Mother     AAA  . Heart disease Mother   . Hyperlipidemia Sister   . Hypertension Sister   . Heart disease Sister     Heart Disease before age 63  . Heart attack Sister   . Hyperlipidemia Son   . Hypertension Son   . Heart disease Son   . Peripheral vascular disease Son     AMPUTATION  . Diabetes Son     Amputation Left foot    Allergies  Allergen Reactions  . Sulfonamide Derivatives Rash    Current outpatient prescriptions:aspirin 325 MG tablet, Take 325 mg by mouth daily.  , Disp: , Rfl: ;  atorvastatin (LIPITOR) 40 MG tablet, Take 40 mg by mouth daily. , Disp: , Rfl: ;  furosemide (LASIX) 40 MG tablet, Take 20-40 mg by mouth daily. , Disp: , Rfl: ;  nitroGLYCERIN (NITROSTAT) 0.4 MG SL tablet, Place 0.4 mg under the tongue every 5 (five) minutes as needed.  , Disp: , Rfl:  pantoprazole (PROTONIX) 40 MG tablet, Take 40 mg by mouth daily.  , Disp: , Rfl: ;  carvedilol (COREG) 12.5 MG tablet, Take 1 tablet (12.5 mg total) by mouth 2 (two) times daily., Disp: 30 tablet, Rfl: 3  BP 146/72  Pulse 75  Resp 16  Ht 5\' 2"  (1.575 m)  Wt 121 lb 11.2 oz (55.203 kg)  BMI 22.26 kg/m2  Body mass index is 22.26 kg/(m^2).       Physical exam well-developed white female appearing stated age in no acute distress She is grossly intact neurologically Carotid arteries without bruits bilaterally 2+ radial pulses bilaterally Heart regular rate and rhythm without murmur Chest clear bilaterally  Impression and plan critical carotid stenosis on the right, asymptomatic. I have recommended endarterectomy for reduction of stroke risk. She will see Dr.Nishan as scheduled on November 20 2 rule out in the upper negative for cardiac difficulty. Assuming this is negative we  will schedule her right carotid endarterectomy at her convenience. I discussed 1-2% risk of stroke with surgery and the slight risk of cranial nerve injury. I explained anticipated one night hospitalization following surgery

## 2011-12-06 ENCOUNTER — Ambulatory Visit (INDEPENDENT_AMBULATORY_CARE_PROVIDER_SITE_OTHER): Payer: Medicare Other | Admitting: Cardiovascular Disease

## 2011-12-06 ENCOUNTER — Encounter: Payer: Self-pay | Admitting: Cardiovascular Disease

## 2011-12-06 VITALS — BP 128/66 | HR 70 | Ht 62.0 in | Wt 124.0 lb

## 2011-12-06 DIAGNOSIS — I2589 Other forms of chronic ischemic heart disease: Secondary | ICD-10-CM

## 2011-12-06 DIAGNOSIS — I480 Paroxysmal atrial fibrillation: Secondary | ICD-10-CM

## 2011-12-06 DIAGNOSIS — Z01810 Encounter for preprocedural cardiovascular examination: Secondary | ICD-10-CM | POA: Insufficient documentation

## 2011-12-06 DIAGNOSIS — I255 Ischemic cardiomyopathy: Secondary | ICD-10-CM

## 2011-12-06 DIAGNOSIS — I4891 Unspecified atrial fibrillation: Secondary | ICD-10-CM

## 2011-12-06 NOTE — Assessment & Plan Note (Signed)
Euvolemic continue current meds.  F/U with Dr Ladona Ridgel post CEA to discuss AICD  Will likely need MRI or reevaluaiton of EF for this

## 2011-12-06 NOTE — Patient Instructions (Addendum)
Your physician recommends that you schedule a follow-up appointment in: Next available with Dr Ladona Ridgel for discussion of device implant  Will send clearance letter to Dr Early for your carotid surgery

## 2011-12-06 NOTE — Progress Notes (Signed)
Patient ID: JAZMYNE BEAUCHESNE, female   DOB: 07-26-1929, 78 y.o.   MRN: [id number] The patient is a 78 year old female with a history of coronary artery disease status post prior coronary interventions presenting with a large anterior wall myocardial infarction May 2012 requiring emergent bypass surgery. Ejection fraction is 40%. She had transient atrial fibrillation postoperatively but this has resolved. The patient is not on anticoagulation.  She reports no orthopnea or PND palpitations or syncope. She does report some left-sided "soreness" but no angina symptoms. She is a sensation of numbness in both hands which appears to be a chronic problem and has been discussed with her primary care physician before over the last several years. I suspect the patient has some element of Raynaud's phenomena.  She had F/U EF evaluation that dropped to 30-35% Seen by Dr Ladona Ridgel 04/30/11 Felt AICD appropriate but she had carotid disease that he wanted taken care of first. Patient has tight  Carotid. Reviewed report from 10/15/11 Systolic velocity over 3.8 and diastolic 1.14. 80-99% stenosis.  CTA done 9/3 confirms high grade RICA stenosis.    Since patient is asymptomatic and revascularized she is clear to have surgery as her risk of stroke is high without it  ROS: Denies fever, malais, weight loss, blurry vision, decreased visual acuity, cough, sputum, SOB, hemoptysis, pleuritic pain, palpitaitons, heartburn, abdominal pain, melena, lower extremity edema, claudication, or rash.  All other systems reviewed and negative  General: Affect appropriate Elderly female HEENT: normal Neck supple with no adenopathy JVP normal left  bruits no thyromegaly Lungs clear with no wheezing and good diaphragmatic motion Heart:  S1/S2 no murmur, no rub, gallop or click PMI normal Abdomen: benighn, BS positve, no tenderness, no AAA no bruit.  No HSM or HJR Distal pulses intact with no bruits No edema Neuro non-focal Skin warm and  dry No muscular weakness   Current Outpatient Prescriptions  Medication Sig Dispense Refill  . aspirin 325 MG tablet Take 325 mg by mouth daily.        Marland Kitchen atorvastatin (LIPITOR) 40 MG tablet Take 40 mg by mouth daily.       . carvedilol (COREG) 12.5 MG tablet Take 12.5 mg by mouth 2 (two) times daily.      . furosemide (LASIX) 40 MG tablet Take 20-40 mg by mouth daily.       . nitroGLYCERIN (NITROSTAT) 0.4 MG SL tablet Place 0.4 mg under the tongue every 5 (five) minutes as needed.        . pantoprazole (PROTONIX) 40 MG tablet Take 40 mg by mouth daily.        Marland Kitchen DISCONTD: carvedilol (COREG) 12.5 MG tablet Take 1 tablet (12.5 mg total) by mouth 2 (two) times daily.  30 tablet  3    Allergies  Sulfonamide derivatives  Electrocardiogram:  10/25/11  SR RBBB LAFB anterior MI  Assessment and Plan

## 2011-12-06 NOTE — Assessment & Plan Note (Signed)
Maint NSR In post op state if recurs would use amiodarone

## 2011-12-06 NOTE — Assessment & Plan Note (Signed)
Revascularized with no chest pain or CHF  High risk of CVA with critical RICA stenosis Clear to have surgery with Dr Arbie Cookey

## 2011-12-11 ENCOUNTER — Other Ambulatory Visit: Payer: Self-pay | Admitting: *Deleted

## 2011-12-11 ENCOUNTER — Encounter (HOSPITAL_COMMUNITY): Payer: Self-pay | Admitting: Respiratory Therapy

## 2011-12-12 ENCOUNTER — Ambulatory Visit (HOSPITAL_COMMUNITY)
Admission: RE | Admit: 2011-12-12 | Discharge: 2011-12-12 | Disposition: A | Discharge status: Home / Self Care | Payer: Medicare Other | Source: Ambulatory Visit | Attending: Vascular Surgery | Admitting: Vascular Surgery

## 2011-12-12 ENCOUNTER — Encounter (HOSPITAL_COMMUNITY)
Admission: RE | Admit: 2011-12-12 | Discharge: 2011-12-12 | Disposition: A | Discharge status: Home / Self Care | Payer: Medicare Other | Source: Ambulatory Visit | Attending: Vascular Surgery | Admitting: Vascular Surgery

## 2011-12-12 ENCOUNTER — Encounter (HOSPITAL_COMMUNITY): Payer: Self-pay

## 2011-12-12 DIAGNOSIS — I517 Cardiomegaly: Secondary | ICD-10-CM | POA: Insufficient documentation

## 2011-12-12 DIAGNOSIS — Z01812 Encounter for preprocedural laboratory examination: Secondary | ICD-10-CM | POA: Insufficient documentation

## 2011-12-12 DIAGNOSIS — T82218A Other mechanical complication of coronary artery bypass graft, initial encounter: Secondary | ICD-10-CM | POA: Insufficient documentation

## 2011-12-12 DIAGNOSIS — Y832 Surgical operation with anastomosis, bypass or graft as the cause of abnormal reaction of the patient, or of later complication, without mention of misadventure at the time of the procedure: Secondary | ICD-10-CM | POA: Insufficient documentation

## 2011-12-12 DIAGNOSIS — Z951 Presence of aortocoronary bypass graft: Secondary | ICD-10-CM | POA: Insufficient documentation

## 2011-12-12 DIAGNOSIS — Z01818 Encounter for other preprocedural examination: Secondary | ICD-10-CM | POA: Insufficient documentation

## 2011-12-12 DIAGNOSIS — J9819 Other pulmonary collapse: Secondary | ICD-10-CM | POA: Insufficient documentation

## 2011-12-12 DIAGNOSIS — R0989 Other specified symptoms and signs involving the circulatory and respiratory systems: Secondary | ICD-10-CM | POA: Insufficient documentation

## 2011-12-12 HISTORY — DX: Unspecified cataract: H26.9

## 2011-12-12 HISTORY — DX: Claustrophobia: F40.240

## 2011-12-12 HISTORY — DX: Unspecified osteoarthritis, unspecified site: M19.90

## 2011-12-12 HISTORY — DX: Shortness of breath: R06.02

## 2011-12-12 HISTORY — DX: Reserved for concepts with insufficient information to code with codable children: IMO0002

## 2011-12-12 HISTORY — DX: Unspecified hearing loss, unspecified ear: H91.90

## 2011-12-12 HISTORY — DX: Gastro-esophageal reflux disease without esophagitis: K21.9

## 2011-12-12 HISTORY — DX: Other specified abnormal findings of blood chemistry: R79.89

## 2011-12-12 HISTORY — DX: Pneumonia, unspecified organism: J18.9

## 2011-12-12 LAB — CBC
HCT: 35.2 % — ABNORMAL LOW (ref 36.0–46.0)
MCH: 28.7 pg (ref 26.0–34.0)
MCHC: 32.7 g/dL (ref 30.0–36.0)
MCV: 87.8 fL (ref 78.0–100.0)
RDW: 13.8 % (ref 11.5–15.5)
WBC: 6.1 10*3/uL (ref 4.0–10.5)

## 2011-12-12 LAB — PROTIME-INR: Prothrombin Time: 14.2 seconds (ref 11.6–15.2)

## 2011-12-12 LAB — URINALYSIS, ROUTINE W REFLEX MICROSCOPIC
Bilirubin Urine: NEGATIVE
Hgb urine dipstick: NEGATIVE
Protein, ur: NEGATIVE mg/dL
Urobilinogen, UA: 0.2 mg/dL (ref 0.0–1.0)

## 2011-12-12 LAB — COMPREHENSIVE METABOLIC PANEL
Albumin: 4 g/dL (ref 3.5–5.2)
BUN: 24 mg/dL — ABNORMAL HIGH (ref 6–23)
Calcium: 9.9 mg/dL (ref 8.4–10.5)
Chloride: 103 mEq/L (ref 96–112)
Creatinine, Ser: 1.42 mg/dL — ABNORMAL HIGH (ref 0.50–1.10)
Total Bilirubin: 0.5 mg/dL (ref 0.3–1.2)

## 2011-12-12 LAB — SURGICAL PCR SCREEN
MRSA, PCR: NEGATIVE
Staphylococcus aureus: NEGATIVE

## 2011-12-12 LAB — TYPE AND SCREEN
ABO/RH(D): A POS
Antibody Screen: NEGATIVE

## 2011-12-12 MED ORDER — SODIUM CHLORIDE 0.9 % IV SOLN: INTRAVENOUS | Status: DC

## 2011-12-12 MED ORDER — CEFUROXIME SODIUM 1.5 G IJ SOLR
1.5000 g | INTRAMUSCULAR | Status: AC
  Administered 2011-12-13: 1.5 g via INTRAVENOUS
  Filled 2011-12-12: qty 1.5

## 2011-12-12 NOTE — Pre-Procedure Instructions (Signed)
20 [name] [facility]  12/12/2011   Your procedure is scheduled on:  Friday December 13, 2011.  Report to [facility] at 0530 AM.  Call this number if you have problems the morning of surgery: [phone]   Remember:   Do not eat food or drink:After Midnight.    Take these medicines the morning of surgery with A SIP OF WATER: Carvedilol (Coreg), and Pantoprazole (Protonix)   Do not wear jewelry, make-up or nail polish.  Do not wear lotions, powders, or perfumes. .  Do not shave 48 hours prior to surgery.   Do not bring valuables to the hospital.  Contacts, dentures or bridgework may not be worn into surgery.  Leave suitcase in the car. After surgery it may be brought to your room.  For patients admitted to the hospital, checkout time is 11:00 AM the day of discharge.   Patients discharged the day of surgery will not be allowed to drive home.  Name and phone number of your driver:   Special Instructions: Use CHG soap TONIGHT before you go to bed, and shower in the morning before you come in for surgery   Please read over the following fact sheets that you were given: Pain Booklet, Coughing and Deep Breathing, Blood Transfusion Information, MRSA Information and Surgical Site Infection Prevention

## 2011-12-13 ENCOUNTER — Encounter (HOSPITAL_COMMUNITY): Payer: Self-pay | Admitting: Anesthesiology

## 2011-12-13 ENCOUNTER — Telehealth: Payer: Self-pay | Admitting: Vascular Surgery

## 2011-12-13 ENCOUNTER — Encounter (HOSPITAL_COMMUNITY): Payer: Self-pay | Admitting: *Deleted

## 2011-12-13 ENCOUNTER — Inpatient Hospital Stay (HOSPITAL_COMMUNITY)
Admission: RE | Admit: 2011-12-13 | Discharge: 2011-12-14 | DRG: 039 | Disposition: A | Discharge status: Home / Self Care | Payer: Medicare Other | Source: Ambulatory Visit | Attending: Vascular Surgery | Admitting: Vascular Surgery

## 2011-12-13 ENCOUNTER — Inpatient Hospital Stay (HOSPITAL_COMMUNITY): Payer: Medicare Other | Admitting: Anesthesiology

## 2011-12-13 ENCOUNTER — Encounter (HOSPITAL_COMMUNITY)
Admission: RE | Disposition: A | Discharge status: Home / Self Care | Payer: Self-pay | Source: Ambulatory Visit | Attending: Vascular Surgery

## 2011-12-13 DIAGNOSIS — Z7982 Long term (current) use of aspirin: Secondary | ICD-10-CM

## 2011-12-13 DIAGNOSIS — Z01818 Encounter for other preprocedural examination: Secondary | ICD-10-CM

## 2011-12-13 DIAGNOSIS — Z01812 Encounter for preprocedural laboratory examination: Secondary | ICD-10-CM

## 2011-12-13 DIAGNOSIS — I6529 Occlusion and stenosis of unspecified carotid artery: Secondary | ICD-10-CM

## 2011-12-13 DIAGNOSIS — I509 Heart failure, unspecified: Secondary | ICD-10-CM | POA: Diagnosis present

## 2011-12-13 DIAGNOSIS — K219 Gastro-esophageal reflux disease without esophagitis: Secondary | ICD-10-CM | POA: Diagnosis present

## 2011-12-13 DIAGNOSIS — I10 Essential (primary) hypertension: Secondary | ICD-10-CM | POA: Diagnosis present

## 2011-12-13 DIAGNOSIS — Z9861 Coronary angioplasty status: Secondary | ICD-10-CM

## 2011-12-13 DIAGNOSIS — I2510 Atherosclerotic heart disease of native coronary artery without angina pectoris: Secondary | ICD-10-CM

## 2011-12-13 DIAGNOSIS — I4891 Unspecified atrial fibrillation: Secondary | ICD-10-CM | POA: Diagnosis present

## 2011-12-13 DIAGNOSIS — Z79899 Other long term (current) drug therapy: Secondary | ICD-10-CM

## 2011-12-13 DIAGNOSIS — Z951 Presence of aortocoronary bypass graft: Secondary | ICD-10-CM

## 2011-12-13 DIAGNOSIS — I252 Old myocardial infarction: Secondary | ICD-10-CM

## 2011-12-13 HISTORY — PX: ENDARTERECTOMY: SHX5162

## 2011-12-13 HISTORY — PX: CAROTID ENDARTERECTOMY: SUR193

## 2011-12-13 SURGERY — ENDARTERECTOMY, CAROTID
Anesthesia: General | Site: Neck | Laterality: Right | Wound class: Clean

## 2011-12-13 MED ORDER — ACETAMINOPHEN 325 MG PO TABS: 325.0000 mg | ORAL_TABLET | ORAL | Status: DC | PRN

## 2011-12-13 MED ORDER — ONDANSETRON HCL 4 MG/2ML IJ SOLN: 4.0000 mg | Freq: Four times a day (QID) | INTRAMUSCULAR | Status: DC | PRN

## 2011-12-13 MED ORDER — HYDROMORPHONE HCL PF 1 MG/ML IJ SOLN: 0.2500 mg | INTRAMUSCULAR | Status: DC | PRN

## 2011-12-13 MED ORDER — PROPOFOL 10 MG/ML IV BOLUS
INTRAVENOUS | Status: DC | PRN
  Administered 2011-12-13: 100 mg via INTRAVENOUS

## 2011-12-13 MED ORDER — HEPARIN SODIUM (PORCINE) 1000 UNIT/ML IJ SOLN
INTRAMUSCULAR | Status: DC | PRN
  Administered 2011-12-13: 6000 [IU] via INTRAVENOUS

## 2011-12-13 MED ORDER — 0.9 % SODIUM CHLORIDE (POUR BTL) OPTIME
TOPICAL | Status: DC | PRN
  Administered 2011-12-13: 2000 mL

## 2011-12-13 MED ORDER — PHENOL 1.4 % MT LIQD: 1.0000 | OROMUCOSAL | Status: DC | PRN

## 2011-12-13 MED ORDER — DOPAMINE-DEXTROSE 3.2-5 MG/ML-% IV SOLN: 3.0000 ug/kg/min | INTRAVENOUS | Status: DC | PRN

## 2011-12-13 MED ORDER — GUAIFENESIN-DM 100-10 MG/5ML PO SYRP: 15.0000 mL | ORAL_SOLUTION | ORAL | Status: DC | PRN

## 2011-12-13 MED ORDER — ALUM & MAG HYDROXIDE-SIMETH 200-200-20 MG/5ML PO SUSP: 15.0000 mL | ORAL | Status: DC | PRN

## 2011-12-13 MED ORDER — DEXTROSE 5 % IV SOLN
1.5000 g | Freq: Two times a day (BID) | INTRAVENOUS | Status: AC
  Administered 2011-12-13 – 2011-12-14 (×2): 1.5 g via INTRAVENOUS
  Filled 2011-12-13 (×3): qty 1.5

## 2011-12-13 MED ORDER — ATORVASTATIN CALCIUM 40 MG PO TABS
40.0000 mg | ORAL_TABLET | Freq: Every day | ORAL | Status: DC
  Administered 2011-12-13: 40 mg via ORAL
  Filled 2011-12-13 (×2): qty 1

## 2011-12-13 MED ORDER — LABETALOL HCL 5 MG/ML IV SOLN: 10.0000 mg | INTRAVENOUS | Status: DC | PRN

## 2011-12-13 MED ORDER — POTASSIUM CHLORIDE IN NACL 20-0.9 MEQ/L-% IV SOLN
INTRAVENOUS | Status: DC
  Administered 2011-12-13: 100 mL/h via INTRAVENOUS
  Filled 2011-12-13 (×4): qty 1000

## 2011-12-13 MED ORDER — SODIUM CHLORIDE 0.9 % IV SOLN: 500.0000 mL | Freq: Once | INTRAVENOUS | Status: AC | PRN

## 2011-12-13 MED ORDER — OXYCODONE HCL 5 MG PO TABS: 5.0000 mg | ORAL_TABLET | Freq: Once | ORAL | Status: DC | PRN

## 2011-12-13 MED ORDER — METOPROLOL TARTRATE 1 MG/ML IV SOLN: 2.0000 mg | INTRAVENOUS | Status: DC | PRN

## 2011-12-13 MED ORDER — OXYCODONE HCL 5 MG/5ML PO SOLN
5.0000 mg | Freq: Once | ORAL | Status: DC | PRN
Start: 2011-12-13 — End: 2011-12-13

## 2011-12-13 MED ORDER — OXYCODONE-ACETAMINOPHEN 5-325 MG PO TABS: 1.0000 | ORAL_TABLET | ORAL | Status: DC | PRN

## 2011-12-13 MED ORDER — FENTANYL CITRATE 0.05 MG/ML IJ SOLN
INTRAMUSCULAR | Status: DC | PRN
  Administered 2011-12-13 (×2): 50 ug via INTRAVENOUS

## 2011-12-13 MED ORDER — ONDANSETRON HCL 4 MG/2ML IJ SOLN
INTRAMUSCULAR | Status: DC | PRN
  Administered 2011-12-13: 4 mg via INTRAVENOUS

## 2011-12-13 MED ORDER — PROTAMINE SULFATE 10 MG/ML IV SOLN
INTRAVENOUS | Status: DC | PRN
  Administered 2011-12-13: 50 mg via INTRAVENOUS

## 2011-12-13 MED ORDER — FUROSEMIDE 20 MG PO TABS
20.0000 mg | ORAL_TABLET | Freq: Every day | ORAL | Status: DC
  Administered 2011-12-14: 20 mg via ORAL
  Filled 2011-12-13: qty 1

## 2011-12-13 MED ORDER — NEOSTIGMINE METHYLSULFATE 1 MG/ML IJ SOLN
INTRAMUSCULAR | Status: DC | PRN
  Administered 2011-12-13: 4 mg via INTRAVENOUS

## 2011-12-13 MED ORDER — SODIUM CHLORIDE 0.9 % IR SOLN
Status: DC | PRN
  Administered 2011-12-13: 08:00:00

## 2011-12-13 MED ORDER — POTASSIUM CHLORIDE CRYS ER 20 MEQ PO TBCR: 20.0000 meq | EXTENDED_RELEASE_TABLET | Freq: Once | ORAL | Status: AC | PRN

## 2011-12-13 MED ORDER — PANTOPRAZOLE SODIUM 40 MG PO TBEC
40.0000 mg | DELAYED_RELEASE_TABLET | Freq: Every day | ORAL | Status: DC
  Administered 2011-12-14: 40 mg via ORAL
  Filled 2011-12-13: qty 1

## 2011-12-13 MED ORDER — MORPHINE SULFATE 2 MG/ML IJ SOLN: 2.0000 mg | INTRAMUSCULAR | Status: DC | PRN

## 2011-12-13 MED ORDER — ACETAMINOPHEN 650 MG RE SUPP: 325.0000 mg | RECTAL | Status: DC | PRN

## 2011-12-13 MED ORDER — GLYCOPYRROLATE 0.2 MG/ML IJ SOLN
INTRAMUSCULAR | Status: DC | PRN
  Administered 2011-12-13: .6 mg via INTRAVENOUS

## 2011-12-13 MED ORDER — ONDANSETRON HCL 4 MG/2ML IJ SOLN: 4.0000 mg | Freq: Once | INTRAMUSCULAR | Status: DC | PRN

## 2011-12-13 MED ORDER — SODIUM CHLORIDE 0.9 % IV SOLN
10.0000 mg | INTRAVENOUS | Status: DC | PRN
  Administered 2011-12-13: 10 ug/min via INTRAVENOUS

## 2011-12-13 MED ORDER — NITROGLYCERIN 0.4 MG SL SUBL: 0.4000 mg | SUBLINGUAL_TABLET | SUBLINGUAL | Status: DC | PRN

## 2011-12-13 MED ORDER — DOCUSATE SODIUM 100 MG PO CAPS
100.0000 mg | ORAL_CAPSULE | Freq: Every day | ORAL | Status: DC
Start: 2011-12-14 — End: 2011-12-14
  Administered 2011-12-14: 100 mg via ORAL
  Filled 2011-12-13: qty 1

## 2011-12-13 MED ORDER — LIDOCAINE HCL (CARDIAC) 20 MG/ML IV SOLN
INTRAVENOUS | Status: DC | PRN
  Administered 2011-12-13: 100 mg via INTRAVENOUS

## 2011-12-13 MED ORDER — LACTATED RINGERS IV SOLN
INTRAVENOUS | Status: DC | PRN
  Administered 2011-12-13: 07:00:00 via INTRAVENOUS

## 2011-12-13 MED ORDER — CARVEDILOL 12.5 MG PO TABS
12.5000 mg | ORAL_TABLET | Freq: Two times a day (BID) | ORAL | Status: DC
  Administered 2011-12-13 – 2011-12-14 (×2): 12.5 mg via ORAL
  Filled 2011-12-13 (×3): qty 1

## 2011-12-13 MED ORDER — ROCURONIUM BROMIDE 100 MG/10ML IV SOLN
INTRAVENOUS | Status: DC | PRN
  Administered 2011-12-13: 50 mg via INTRAVENOUS

## 2011-12-13 MED ORDER — HYDRALAZINE HCL 20 MG/ML IJ SOLN: 10.0000 mg | INTRAMUSCULAR | Status: DC | PRN

## 2011-12-13 MED ORDER — ASPIRIN 325 MG PO TABS
325.0000 mg | ORAL_TABLET | Freq: Every day | ORAL | Status: DC
  Administered 2011-12-14: 325 mg via ORAL
  Filled 2011-12-13: qty 1

## 2011-12-13 SURGICAL SUPPLY — 48 items
BENZOIN TINCTURE PRP APPL 2/3 (GAUZE/BANDAGES/DRESSINGS) ×2 IMPLANT
CANISTER SUCTION 2500CC (MISCELLANEOUS) ×2 IMPLANT
CATH ROBINSON RED A/P 18FR (CATHETERS) ×2 IMPLANT
CLIP LIGATING EXTRA MED SLVR (CLIP) ×2 IMPLANT
CLIP LIGATING EXTRA SM BLUE (MISCELLANEOUS) ×2 IMPLANT
CLOTH BEACON ORANGE TIMEOUT ST (SAFETY) ×2 IMPLANT
CLSR STERI-STRIP ANTIMIC 1/2X4 (GAUZE/BANDAGES/DRESSINGS) ×2 IMPLANT
COVER SURGICAL LIGHT HANDLE (MISCELLANEOUS) ×2 IMPLANT
CRADLE DONUT ADULT HEAD (MISCELLANEOUS) ×2 IMPLANT
DECANTER SPIKE VIAL GLASS SM (MISCELLANEOUS) IMPLANT
DRAIN HEMOVAC 1/8 X 5 (WOUND CARE) IMPLANT
DRAPE WARM FLUID 44X44 (DRAPE) ×2 IMPLANT
DRSG COVADERM 4X8 (GAUZE/BANDAGES/DRESSINGS) ×2 IMPLANT
ELECT REM PT RETURN 9FT ADLT (ELECTROSURGICAL) ×2
ELECTRODE REM PT RTRN 9FT ADLT (ELECTROSURGICAL) ×1 IMPLANT
EVACUATOR SILICONE 100CC (DRAIN) IMPLANT
GEL ULTRASOUND 20GR AQUASONIC (MISCELLANEOUS) IMPLANT
GLOVE BIO SURGEON STRL SZ 6.5 (GLOVE) ×2 IMPLANT
GLOVE BIO SURGEON STRL SZ7.5 (GLOVE) ×2 IMPLANT
GLOVE BIOGEL PI IND STRL 6.5 (GLOVE) ×1 IMPLANT
GLOVE BIOGEL PI IND STRL 7.0 (GLOVE) ×1 IMPLANT
GLOVE BIOGEL PI IND STRL 7.5 (GLOVE) ×2 IMPLANT
GLOVE BIOGEL PI INDICATOR 6.5 (GLOVE) ×1
GLOVE BIOGEL PI INDICATOR 7.0 (GLOVE) ×1
GLOVE BIOGEL PI INDICATOR 7.5 (GLOVE) ×2
GLOVE SS BIOGEL STRL SZ 7.5 (GLOVE) ×1 IMPLANT
GLOVE SUPERSENSE BIOGEL SZ 7.5 (GLOVE) ×1
GOWN STRL NON-REIN LRG LVL3 (GOWN DISPOSABLE) ×8 IMPLANT
KIT BASIN OR (CUSTOM PROCEDURE TRAY) ×2 IMPLANT
KIT ROOM TURNOVER OR (KITS) ×2 IMPLANT
NEEDLE 22X1 1/2 (OR ONLY) (NEEDLE) IMPLANT
NS IRRIG 1000ML POUR BTL (IV SOLUTION) ×4 IMPLANT
PACK CAROTID (CUSTOM PROCEDURE TRAY) ×2 IMPLANT
PAD ARMBOARD 7.5X6 YLW CONV (MISCELLANEOUS) ×4 IMPLANT
PATCH HEMASHIELD 8X75 (Vascular Products) ×2 IMPLANT
SHUNT CAROTID BYPASS 10 (VASCULAR PRODUCTS) ×2 IMPLANT
SHUNT CAROTID BYPASS 12FRX15.5 (VASCULAR PRODUCTS) IMPLANT
SPECIMEN JAR SMALL (MISCELLANEOUS) ×2 IMPLANT
STRIP CLOSURE SKIN 1/2X4 (GAUZE/BANDAGES/DRESSINGS) ×2 IMPLANT
SUT ETHILON 3 0 PS 1 (SUTURE) IMPLANT
SUT PROLENE 6 0 CC (SUTURE) ×2 IMPLANT
SUT VIC AB 3-0 SH 27 (SUTURE) ×2
SUT VIC AB 3-0 SH 27X BRD (SUTURE) ×2 IMPLANT
SUT VICRYL 4-0 PS2 18IN ABS (SUTURE) ×2 IMPLANT
SYR CONTROL 10ML LL (SYRINGE) IMPLANT
TOWEL OR 17X24 6PK STRL BLUE (TOWEL DISPOSABLE) ×2 IMPLANT
TOWEL OR 17X26 10 PK STRL BLUE (TOWEL DISPOSABLE) ×2 IMPLANT
WATER STERILE IRR 1000ML POUR (IV SOLUTION) ×2 IMPLANT

## 2011-12-13 NOTE — Progress Notes (Signed)
Pt with no post surgery void, bladder scan done 225cc noted in bladder, pt with no urge to void, pt on BSC, nursing will cont to monitor

## 2011-12-13 NOTE — Interval H&P Note (Signed)
History and Physical Interval Note:  12/13/2011 7:21 AM  [name]  has presented today for surgery, with the diagnosis of Right ICA stenosis  The various methods of treatment have been discussed with the patient and family. After consideration of risks, benefits and other options for treatment, the patient has consented to  Procedure(s) (LRB) with comments: ENDARTERECTOMY CAROTID (Right) as a surgical intervention .  The patient's history has been reviewed, patient examined, no change in status, stable for surgery.  I have reviewed the patient's chart and labs.  Questions were answered to the patient's satisfaction.     [name]

## 2011-12-13 NOTE — Progress Notes (Signed)
Utilization review completed.  

## 2011-12-13 NOTE — Anesthesia Postprocedure Evaluation (Signed)
  Anesthesia Post-op Note  Patient: [name]  Procedure(s) Performed: Procedure(s) (LRB) with comments: ENDARTERECTOMY CAROTID (Right) - Right Carotid endartectomy with dacron patch angioplasty  Patient Location: PACU  Anesthesia Type: General  Level of Consciousness: awake and alert   Airway and Oxygen Therapy: Patient Spontanous Breathing and Patient connected to nasal cannula oxygen  Post-op Pain: mild  Post-op Assessment: Post-op Vital signs reviewed  Post-op Vital Signs: Reviewed  Complications: No apparent anesthesia complications

## 2011-12-13 NOTE — H&P (View-Only) (Signed)
Patient presents for followup of her right carotid stenosis. She reports she has had no new cardiac difficulties since her emergent coronary bypass grafting in May of 2012. She specifically denies any new neurologic deficits. No amaurosis fugax transient ischemic attack or stroke. He had a recent CT angiogram in [place] and I have reviewed the actual films of this. This does show a critical stenosis in her internal carotid artery at the bifurcation. The internal carotid distal this and the common carotid below have minimal atherosclerotic change. She does not have a critical stenosis on the left carotid. She is right-handed.  Past Medical History  Diagnosis Date  . Unspecified essential hypertension   . Edema   . Coronary atherosclerosis of native coronary artery     echocardiogram June 2012 ejection fraction 40% with apical and anteroseptal akinesis with scarring.  . Hemothorax on left   . Myocardial infarction of anterior wall greater than eight weeks ago     large anterior wall myocardial infarction status post emergent PCI followed by coronary bypass grafting.,  Ejection fraction 40%  . Carotid artery disease     followed by vascular surgery  Carotid duplex in 02/2010-60-79% right proximal internal carotid artery stenosis; plaque without focal stenosis on the left; no change compared with 08/30/2009.  . Transient atrial fibrillation or flutter     post myocardial infarction and she remains in normal sinus rhythm  . Coronary artery disease     Acute anteroseptal MI treated with urgent PCI and followed by CABG surgery for left main and severe three-vessel coronary disease in 07/2010; prior percutaneous intervention in 05/2007 with DES to the proximal LAD; RCA stent in 09/2009; both stents occluded as well as M1 prior to surgery  . CHF (congestive heart failure)     History  Substance Use Topics  . Smoking status: Never Smoker   . Smokeless tobacco: Never Used  . Alcohol Use: No     Family History  Problem Relation Age of Onset  . Cancer Other   . Coronary artery disease Other   . Heart failure Father     chf  . Heart disease Father   . Hyperlipidemia Mother   . Hypertension Mother   . Aneurysm Mother     AAA  . Heart disease Mother   . Hyperlipidemia Sister   . Hypertension Sister   . Heart disease Sister     Heart Disease before age 60  . Heart attack Sister   . Hyperlipidemia Son   . Hypertension Son   . Heart disease Son   . Peripheral vascular disease Son     AMPUTATION  . Diabetes Son     Amputation Left foot    Allergies  Allergen Reactions  . Sulfonamide Derivatives Rash    Current outpatient prescriptions:aspirin 325 MG tablet, Take 325 mg by mouth daily.  , Disp: , Rfl: ;  atorvastatin (LIPITOR) 40 MG tablet, Take 40 mg by mouth daily. , Disp: , Rfl: ;  furosemide (LASIX) 40 MG tablet, Take 20-40 mg by mouth daily. , Disp: , Rfl: ;  nitroGLYCERIN (NITROSTAT) 0.4 MG SL tablet, Place 0.4 mg under the tongue every 5 (five) minutes as needed.  , Disp: , Rfl:  pantoprazole (PROTONIX) 40 MG tablet, Take 40 mg by mouth daily.  , Disp: , Rfl: ;  carvedilol (COREG) 12.5 MG tablet, Take 1 tablet (12.5 mg total) by mouth 2 (two) times daily., Disp: 30 tablet, Rfl: 3  BP 146/72    Pulse 75  Resp 16  Ht 5' 2" (1.575 m)  Wt 121 lb 11.2 oz (55.203 kg)  BMI 22.26 kg/m2  Body mass index is 22.26 kg/(m^2).       Physical exam well-developed white female appearing stated age in no acute distress She is grossly intact neurologically Carotid arteries without bruits bilaterally 2+ radial pulses bilaterally Heart regular rate and rhythm without murmur Chest clear bilaterally  Impression and plan critical carotid stenosis on the right, asymptomatic. I have recommended endarterectomy for reduction of stroke risk. She will see Dr.Nishan as scheduled on November 20 2 rule out in the upper negative for cardiac difficulty. Assuming this is negative we  will schedule her right carotid endarterectomy at her convenience. I discussed 1-2% risk of stroke with surgery and the slight risk of cranial nerve injury. I explained anticipated one night hospitalization following surgery  

## 2011-12-13 NOTE — Transfer of Care (Signed)
Immediate Anesthesia Transfer of Care Note  Patient: [name] [facility]  Procedure(s) Performed: Procedure(s) (LRB) with comments: ENDARTERECTOMY CAROTID (Right) - Right Carotid endartectomy with dacron patch angioplasty  Patient Location: PACU  Anesthesia Type: General  Level of Consciousness: awake, alert , oriented and patient cooperative  Airway & Oxygen Therapy: Patient Spontanous Breathing and Patient connected to face mask oxygen  Post-op Assessment: Report given to PACU RN, Post -op Vital signs reviewed and stable and Patient moving all extremities X 4  Post vital signs: Reviewed and stable  Complications: No apparent anesthesia complications

## 2011-12-13 NOTE — Anesthesia Preprocedure Evaluation (Addendum)
Anesthesia Evaluation  Patient identified by MRN, date of birth, ID band Patient awake    Reviewed: Allergy & Precautions, H&P , NPO status , Patient's Chart, lab work & pertinent test results, reviewed documented beta blocker date and time   Airway Mallampati: I TM Distance: >3 FB Neck ROM: Full    Dental  (+) Edentulous Upper, Edentulous Lower and Dental Advisory Given   Pulmonary shortness of breath and at rest, pneumonia -, resolved,  breath sounds clear to auscultation        Cardiovascular hypertension, Pt. on home beta blockers + CAD, + Past MI and +CHF + dysrhythmias Atrial Fibrillation Rhythm:Regular Rate:Normal     Neuro/Psych    GI/Hepatic GERD-  Controlled,  Endo/Other    Renal/GU Renal disease     Musculoskeletal   Abdominal   Peds  Hematology   Anesthesia Other Findings   Reproductive/Obstetrics                          Anesthesia Physical Anesthesia Plan  ASA: IV  Anesthesia Plan: General   Post-op Pain Management:    Induction: Intravenous  Airway Management Planned: Oral ETT  Additional Equipment:   Intra-op Plan:   Post-operative Plan: Extubation in OR  Informed Consent: I have reviewed the patients History and Physical, chart, labs and discussed the procedure including the risks, benefits and alternatives for the proposed anesthesia with the patient or authorized representative who has indicated his/her understanding and acceptance.   Dental advisory given  Plan Discussed with: CRNA, Anesthesiologist and Surgeon  Anesthesia Plan Comments:         Anesthesia Quick Evaluation

## 2011-12-13 NOTE — Progress Notes (Signed)
Pt admitted from PACU, oriented to unit, call bell given, family at [facility], all questions answered

## 2011-12-13 NOTE — Telephone Encounter (Signed)
Message copied by Margaretmary Eddy on Fri Dec 13, 2011  3:04 PM ------      Message from: Phillips Odor      Created: Fri Dec 13, 2011 10:24 AM                   ----- Message -----         From: Marlowe Shores, PA         Sent: 12/13/2011   8:53 AM           To: Melene Plan, RN            2 week F/U - CEA - Early

## 2011-12-13 NOTE — Op Note (Signed)
[facility] [place]  Patient name: [name] MRN: [id number] DOB: 1929/08/13 Sex: female  12/13/2011 Pre-operative Diagnosis: Asymptomatic right carotid stenosis Post-operative diagnosis:  Same Surgeon:  Larina Earthly, M.D. Assistants:  Dr Cari Caraway Procedure:    right carotid Endarterectomy with Dacron patch angioplasty Anesthesia:  General Blood Loss:  See anesthesia record Specimens:  Carotid Plaque to pathology  Indications for surgery:  Asymptomatic  Procedure in detail:  The patient was taken to the operating and placed in the supine position. The neck was prepped and draped in the usual sterile fashion. An incision was made anterior to the sternocleidomastoid muscle and continued with electrocautery through the platysma muscle. The muscle was retracted posteriorly and the carotid sheath was opened. The facial vein was ligated with 2-0 silk ties and divided. The common carotid artery was encircled with an umbilical tape and Rummel tourniquet. Dissection was continued onto the carotid bifurcation. The superior thyroid artery was controlled with a 2-0 silk Potts tie. The external carotid organ was encircled with a vessel loop and the internal carotid was encircled with umbilical tape and Rummel tourniquet. The hypoglossal and vagus nerves were identified and preserved.  The patient was given systemic heparinization. After adequate circulation time, the internal,external and common carotid arteries were occluded. The common carotid was opened with an 11 blade and the arteriotomy was continued with Potts scissors onto the internal carotid artery. A 10 shunt was passed up the internal carotid artery, allowed to back bleed, and then passed down the common carotid artery. The shunt was secured with Rummel tourniquet. The endarterectomy was begun on the common carotid artery  plaque was divided proximally with Potts scissors. The endarterectomy was continued onto the  carotid bifurcation. The external carotid was endarterectomized by eversion technique and the internal carotid artery was endarterectomized in an open fashion. Remaining debris was removed from the endarterectomy plane. A Dacron patch was brought to the field and sewn as a patch angioplasty. Prior to completing the anastomosis, the shunt was removed and the usual flushing maneuvers were undertaken. The anastomosis was then completed and flow was restored first to the external and then the internal carotid artery. Excellent flow characteristics were noted with hand-held Doppler in the internal and external carotid arteries.  The patient was given protamine to reverse the heparin. Hemostasis was obtained with electrocautery. The wounds were irrigated with saline. The wound was closed by first reapproximating the sternocleidomastoid muscle over the carotid artery with interrupted 3-0 Vicryl sutures. Next, the platysma was closed with a running 3-0 Vicryl suture. The skin was closed with a 4-0 subcuticular Vicryl suture. Benzoin and Steri-Strips were applied to the incision. A sterile dressing was placed over the incision. All sponge and needle counts were correct. The patient was awakened in the operating room, neurologically intact. They were transferred to the PACU in stable condition.   Disposition:  To PACU in stable condition,neurologically intact  Relevant Operative Details:  Focal calcified plaque,>80% stenosis  Larina Earthly, M.D. [facility] [place] Office: [phone] Pager:  [phone]

## 2011-12-14 LAB — BASIC METABOLIC PANEL
Chloride: 101 mEq/L (ref 96–112)
Creatinine, Ser: 1.46 mg/dL — ABNORMAL HIGH (ref 0.50–1.10)
GFR calc Af Amer: 38 mL/min — ABNORMAL LOW (ref >90)

## 2011-12-14 LAB — CBC
MCV: 87.5 fL (ref 78.0–100.0)
Platelets: 171 10*3/uL (ref 150–400)
RDW: 14.2 % (ref 11.5–15.5)
WBC: 8 10*3/uL (ref 4.0–10.5)

## 2011-12-14 NOTE — Progress Notes (Signed)
VASCULAR AND VEIN SURGERY POST - OP CEA PROGRESS NOTE  Date of Surgery: 12/13/2011 Surgeon: Surgeon(s): Larina Earthly, MD Chuck Hint, MD 1 Day Post-Op right Carotid Endarterectomy .  HPI: [name] is a 78 y.o. female who is 1 Day Post-Op right Carotid Endarterectomy . Patient is doing well.Patient denies headache; Patient denies difficulty swallowing; denies weakness in upper or lower extremities; Pt. denies other symptoms of stroke or TIA.  IMAGING: Dg Chest 2 View  12/12/2011  *RADIOLOGY REPORT*  Clinical Data: Preop for vascular surgery.  History CHF and prior CABG.  CHEST - 2 VIEW  Comparison: 06/26/2011 [facility]).  Findings:  Lateral view degraded by patient arm position.  Accentuation of expected thoracic kyphosis.Prior median sternotomy for CABG.  The second median sternotomy wire is displaced lateral to the others.  This has been present back to 08/21/2010.  Mild osteopenia.  Patient rotated minimally right on the frontal. Moderate cardiomegaly. Trace right-sided pleural thickening blunts the costophrenic angle on the frontal, improved. No pneumothorax. Mild lower lobe predominant pulmonary venous congestion, slightly improved since the prior.  Patchy right base atelectasis is improved.  IMPRESSION:  1.  Cardiomegaly with pulmonary venous congestion, decreased since the prior. 2.  Patchy right base atelectasis with minimal right pleural thickening. 3.  Prior median sternotomy.  Similar leftward displacement of the second wire.  This has been present back to 08/21/2010, arguing against wire dehiscence.   Original Report Authenticated By: Consuello Bossier, M.D.     Significant Diagnostic Studies: CBC Lab Results  Component Value Date   WBC 8.0 12/14/2011   HGB 9.6* 12/14/2011   HCT 29.3* 12/14/2011   MCV 87.5 12/14/2011   PLT 171 12/14/2011    BMET    Component Value Date/Time   NA 137 12/14/2011 0502   K 3.8 12/14/2011 0502   CL 101 12/14/2011 0502     CO2 25 12/14/2011 0502   GLUCOSE 121* 12/14/2011 0502   BUN 19 12/14/2011 0502   CREATININE 1.46* 12/14/2011 0502   CREATININE 1.44* 10/25/2011 1235   CALCIUM 9.1 12/14/2011 0502   GFRNONAA 33* 12/14/2011 0502   GFRAA 38* 12/14/2011 0502    COAG Lab Results  Component Value Date   INR 1.11 12/12/2011   INR 1.12 08/22/2010   INR 1.20 08/13/2010   No results found for this basename: PTT      Intake/Output Summary (Last 24 hours) at 12/14/11 0800 Last data filed at 12/14/11 0704  Gross per 24 hour  Intake   2410 ml  Output    325 ml  Net   2085 ml    Physical Exam:  BP Readings from Last 3 Encounters:  12/14/11 123/67  12/14/11 123/67  12/12/11 129/74   Temp Readings from Last 3 Encounters:  12/14/11 98.5 F (36.9 C) Oral  12/14/11 98.5 F (36.9 C) Oral  12/12/11 97.6 F (36.4 C)    SpO2 Readings from Last 3 Encounters:  12/14/11 92%  12/14/11 92%  12/12/11 95%   Pulse Readings from Last 3 Encounters:  12/14/11 71  12/14/11 71  12/12/11 77    Pt is A&O x 3 Gait is normal Speech is fluent right Neck Wound is healing well Patient with Negative tongue deviation and Negative facial droop Pt has good and equal strength in all extremities  Assessment: [name] is a 78 y.o. female is S/P Right Carotid endarterectomy Pt is voiding, ambulating and taking po well   Plan: Discharge  to: Home after voids, and ambulates Follow-up in 2 weeks   Phyllis Whitefield J [phone] 12/14/2011 8:00 AM

## 2011-12-14 NOTE — Progress Notes (Signed)
Pt d/c home per MD order, d/c instructions and prescriptions given, pt VSS, family at [facility], all questions answered

## 2011-12-14 NOTE — Discharge Summary (Signed)
[facility] Discharge Summary   Patient ID:  [name] MRN: [id number] DOB/AGE: Oct 26, 1929 78 y.o.  Admit date: 12/13/2011 Discharge date: 12/14/2011 Date of Surgery: 12/13/2011 Surgeon: Surgeon(s): Larina Earthly, MD Chuck Hint, MD  Admission Diagnosis: Right ICA stenosis  Discharge Diagnoses:  Right ICA stenosis  Secondary Diagnoses: Past Medical History  Diagnosis Date  . Unspecified essential hypertension   . Edema   . Coronary atherosclerosis of native coronary artery     echocardiogram June 2012 ejection fraction 40% with apical and anteroseptal akinesis with scarring.  . Hemothorax on left   . Myocardial infarction of anterior wall greater than eight weeks ago     large anterior wall myocardial infarction status post emergent PCI followed by coronary bypass grafting.,  Ejection fraction 40%  . Carotid artery disease     followed by vascular surgery  Carotid duplex in 02/2010-60-79% right proximal internal carotid artery stenosis; plaque without focal stenosis on the left; no change compared with 08/30/2009.  . Transient atrial fibrillation or flutter     post myocardial infarction and she remains in normal sinus rhythm  . Coronary artery disease     Acute anteroseptal MI treated with urgent PCI and followed by CABG surgery for left main and severe three-vessel coronary disease in 07/2010; prior percutaneous intervention in 05/2007 with DES to the proximal LAD; RCA stent in 09/2009; both stents occluded as well as M1 prior to surgery  . CHF (congestive heart failure)   . Cataract     bilateral   . HOH (hard of hearing)   . GERD (gastroesophageal reflux disease)   . Shortness of breath     With ambulation  . Pneumonia   . Creatinine elevation   . Claustrophobia   . Arthritis     back    Procedure(s): ENDARTERECTOMY CAROTID  Discharged Condition: good  HPI:  [name] is a 78 y.o. female who has known right carotid  stenosis. She reports she has had no new cardiac difficulties since her emergent coronary bypass grafting in May of 2012. She specifically denies any new neurologic deficits. No amaurosis fugax transient ischemic attack or stroke. He had a recent CT angiogram in [facility] and I have reviewed the actual films of this. This does show a critical stenosis in her Right internal carotid artery at the bifurcation. The internal carotid distal this and the common carotid below have minimal atherosclerotic change. She does not have a critical stenosis on the left carotid. She is right-handed. Pt was admitted for right CEA.   Hospital Course:  [name] is a 78 y.o. female is S/P Right Procedure(s): ENDARTERECTOMY CAROTID Extubated: POD # 0 Post-op wounds healing well Pt. Ambulating, voiding and taking PO diet without difficulty. Pt pain controlled with PO pain meds. Labs as below Complications:none  Consults:     Significant Diagnostic Studies: CBC Lab Results  Component Value Date   WBC 8.0 12/14/2011   HGB 9.6* 12/14/2011   HCT 29.3* 12/14/2011   MCV 87.5 12/14/2011   PLT 171 12/14/2011    BMET    Component Value Date/Time   NA 137 12/14/2011 0502   K 3.8 12/14/2011 0502   CL 101 12/14/2011 0502   CO2 25 12/14/2011 0502   GLUCOSE 121* 12/14/2011 0502   BUN 19 12/14/2011 0502   CREATININE 1.46* 12/14/2011 0502   CREATININE 1.44* 10/25/2011 1235   CALCIUM 9.1 12/14/2011 0502   GFRNONAA 33* 12/14/2011  0502   GFRAA 38* 12/14/2011 0502   COAG Lab Results  Component Value Date   INR 1.11 12/12/2011   INR 1.12 08/22/2010   INR 1.20 08/13/2010     Disposition:  Discharge to :Home Discharge Orders    Future Appointments: Provider: Department: Dept Phone: Center:   01/07/2012 11:45 AM Larina Earthly, MD [facility] [phone] [facility]   01/09/2012 10:15 AM Marinus Maw, MD [facility] [phone] [facility]   04/21/2012 10:30 AM [facility]  [phone] [facility]   04/21/2012 11:30 AM Larina Earthly, MD [facility] [phone] [facility]     Future Orders Please Complete By Expires   Resume previous diet      Driving Restrictions      Comments:   No driving for 2 weeks   Lifting restrictions      Comments:   No lifting for 4 weeks   Call MD for:  temperature >100.5      Call MD for:  redness, tenderness, or signs of infection (pain, swelling, bleeding, redness, odor or green/yellow discharge around incision site)      Call MD for:  severe or increased pain, loss or decreased feeling  in affected limb(s)      Increase activity slowly      Comments:   Walk with assistance use walker or cane as needed   May shower       Scheduling Instructions:   Sunday   No dressing needed      may wash over wound with mild soap and water      CAROTID [place]: Call MD for difficulty swallowing or speaking; weakness in arms or legs that is a new symtom; severe headache.  If you have increased swelling in the neck and/or  are having difficulty breathing, CALL 911         [place], [place]  Home Medication Instructions [id number]   Printed on:12/14/11 0802  Medication Information                    nitroGLYCERIN (NITROSTAT) 0.4 MG SL tablet Place 0.4 mg under the tongue every 5 (five) minutes as needed. For chest pain           furosemide (LASIX) 40 MG tablet Take 20-40 mg by mouth daily.            atorvastatin (LIPITOR) 40 MG tablet Take 40 mg by mouth daily.            pantoprazole (PROTONIX) 40 MG tablet Take 40 mg by mouth daily.             aspirin 325 MG tablet Take 325 mg by mouth daily.             carvedilol (COREG) 12.5 MG tablet Take 12.5 mg by mouth 2 (two) times daily.           oxyCODONE-acetaminophen (ROXICET) 5-325 MG per tablet Take 1 tablet by mouth every 4 (four) hours as needed for pain.            Verbal and written Discharge instructions given to the patient. Wound care per Discharge AVS Follow-up Information     Follow up with EARLY, TODD, MD. In 2 weeks. (office will arrange -sent)    Contact information:   [address] [place] [address] [phone]          Signed: Marlowe Shores 12/14/2011, 8:02 AM  Doing well. Neuro intact. Incision looks fine. Agree with plans for  discharge today.  Di Kindle. Edilia Bo, MD, FACS [facility] [phone] 12/14/2011

## 2011-12-14 NOTE — Plan of Care (Signed)
Problem: Consults Goal: Diagnosis CEA/CES/AAA Stent Outcome: Completed/Met Date Met:  12/14/11 Carotid Endarterectomy (CEA)

## 2011-12-16 ENCOUNTER — Encounter (HOSPITAL_COMMUNITY): Payer: Self-pay | Admitting: Vascular Surgery

## 2012-01-06 ENCOUNTER — Ambulatory Visit (INDEPENDENT_AMBULATORY_CARE_PROVIDER_SITE_OTHER): Payer: Medicare Other | Admitting: Internal Medicine

## 2012-01-06 ENCOUNTER — Encounter: Payer: Self-pay | Admitting: Vascular Surgery

## 2012-01-06 ENCOUNTER — Encounter: Payer: Self-pay | Admitting: Internal Medicine

## 2012-01-06 VITALS — BP 156/80 | HR 72 | Ht 62.0 in | Wt 123.6 lb

## 2012-01-06 DIAGNOSIS — I255 Ischemic cardiomyopathy: Secondary | ICD-10-CM

## 2012-01-06 DIAGNOSIS — I219 Acute myocardial infarction, unspecified: Secondary | ICD-10-CM

## 2012-01-06 DIAGNOSIS — I2589 Other forms of chronic ischemic heart disease: Secondary | ICD-10-CM

## 2012-01-06 DIAGNOSIS — I10 Essential (primary) hypertension: Secondary | ICD-10-CM

## 2012-01-06 NOTE — Patient Instructions (Signed)
Your physician recommends that you schedule a follow-up appointment in: To be determined after echocardiogram  Your physician has requested that you have an echocardiogram. Echocardiography is a painless test that uses sound waves to create images of your heart. It provides your doctor with information about the size and shape of your heart and how well your heart's chambers and valves are working. This procedure takes approximately one hour. There are no restrictions for this procedure.

## 2012-01-06 NOTE — Progress Notes (Signed)
HPI [name] returns today for followup. I saw the patient several months ago, after she was referred to consider ICD implantation. She is a very pleasant 78 year old woman with an ischemic cardiomyopathy, carotid vascular disease, status post recent endarterectomy. Her last assessment of ejection fraction was nearly a year ago. Her initial EF was 40% after her MI, but subsequent 2-D Echo demonstrated an ejection fraction of 30%.. The patient has class II heart failure symptoms. She denies angina. She had no complications from carotid surgery. She denies recent syncope or chest pain. Allergies  Allergen Reactions  . Sulfonamide Derivatives Rash     Current Outpatient Prescriptions  Medication Sig Dispense Refill  . aspirin 325 MG tablet Take 325 mg by mouth daily.        Marland Kitchen atorvastatin (LIPITOR) 40 MG tablet Take 40 mg by mouth daily.       . carvedilol (COREG) 12.5 MG tablet Take 12.5 mg by mouth 2 (two) times daily.      . furosemide (LASIX) 40 MG tablet Take 20-40 mg by mouth daily.       . nitroGLYCERIN (NITROSTAT) 0.4 MG SL tablet Place 0.4 mg under the tongue every 5 (five) minutes as needed. For chest pain      . pantoprazole (PROTONIX) 40 MG tablet Take 40 mg by mouth daily.           Past Medical History  Diagnosis Date  . Unspecified essential hypertension   . Edema   . Coronary atherosclerosis of native coronary artery     echocardiogram June 2012 ejection fraction 40% with apical and anteroseptal akinesis with scarring.  . Hemothorax on left   . Myocardial infarction of anterior wall greater than eight weeks ago     large anterior wall myocardial infarction status post emergent PCI followed by coronary bypass grafting.,  Ejection fraction 40%  . Carotid artery disease     followed by vascular surgery  Carotid duplex in 02/2010-60-79% right proximal internal carotid artery stenosis; plaque without focal stenosis on the left; no change compared with 08/30/2009.  . Transient  atrial fibrillation or flutter     post myocardial infarction and she remains in normal sinus rhythm  . Coronary artery disease     Acute anteroseptal MI treated with urgent PCI and followed by CABG surgery for left main and severe three-vessel coronary disease in 07/2010; prior percutaneous intervention in 05/2007 with DES to the proximal LAD; RCA stent in 09/2009; both stents occluded as well as M1 prior to surgery  . CHF (congestive heart failure)   . Cataract     bilateral   . HOH (hard of hearing)   . GERD (gastroesophageal reflux disease)   . Shortness of breath     With ambulation  . Pneumonia   . Creatinine elevation   . Claustrophobia   . Arthritis     back    ROS:   All systems reviewed and negative except as noted in the HPI.   Past Surgical History  Procedure Date  . Tonsillectomy   . Colonoscopy 2012  . Sternal wound exploration, evacuation of left hemothorax,  and sternal wou nd closure 08/13/10    Hendrickson  . Cabgx 3 08/09/10    [name]  . Cardiac catheterization 2001, 2008, 2009, 2012  . Pr vein bypass graft,aorto-fem-pop 07/29/10  . Coronary artery bypass graft 08/08/2010    Triple bypass  . Appendectomy   . Hand surgery     right hand  .  Endarterectomy 12/13/2011    Procedure: ENDARTERECTOMY CAROTID;  Surgeon: Larina Earthly, MD;  Location: [facility];  Service: Vascular;  Laterality: Right;  Right Carotid endartectomy with dacron patch angioplasty     Family History  Problem Relation Age of Onset  . Cancer Other   . Coronary artery disease Other   . Heart failure Father     chf  . Heart disease Father   . Hyperlipidemia Mother   . Hypertension Mother   . Aneurysm Mother     AAA  . Heart disease Mother   . Hyperlipidemia Sister   . Hypertension Sister   . Heart disease Sister     Heart Disease before age 30  . Heart attack Sister   . Hyperlipidemia Son   . Hypertension Son   . Heart disease Son   . Peripheral vascular disease Son     AMPUTATION  .  Diabetes Son     Amputation Left foot     History   Social History  . Marital Status: Widowed    Spouse Name: N/A    Number of Children: N/A  . Years of Education: N/A   Occupational History  . Not on file.   Social History Main Topics  . Smoking status: Never Smoker   . Smokeless tobacco: Never Used  . Alcohol Use: No  . Drug Use: No  . Sexually Active: Not on file   Other Topics Concern  . Not on file   Social History Narrative   Regular exercise- yes      BP 156/80  Pulse 72  Ht 5\' 2"  (1.575 m)  Wt 123 lb 9.6 oz (56.065 kg)  BMI 22.61 kg/m2  SpO2 93%  Physical Exam:  Well appearing 78 year old woman,NAD HEENT: Unremarkable Neck:  No JVD, no thyromegally Lungs:  Clear with no wheezes, rales, or rhonchi. HEART:  Regular rate rhythm, no murmurs, no rubs, no clicks Abd:  soft, positive bowel sounds, no organomegally, no rebound, no guarding Ext:  2 plus pulses, no edema, no cyanosis, no clubbing Skin:  No rashes no nodules Neuro:  CN II through XII intact, motor grossly intact  ECG - normal sinus rhythm with right bundle branch block left anterior fascicular block  Assess/Plan:

## 2012-01-06 NOTE — Assessment & Plan Note (Signed)
The patient has chronic systolic heart failure, class II, and an ejection fraction of 30% by echo, almost a year ago. I recommended that she undergo repeat echo. There is a chance that her left ventricular function will have improved, and if so, she would not require ICD implantation. If her ejection fraction remains severely reduced, prophylactic ICD implantation for primary prevention will be recommended. We will try to obtain her echo in the next few days, so that we might make plans to proceed with ICD implantation if her ejection fraction remains reduced.

## 2012-01-06 NOTE — Assessment & Plan Note (Signed)
Her blood pressure is not well controlled. She may require up titration of her medical therapy. She is instructed to reduce her salt intake.

## 2012-01-07 ENCOUNTER — Ambulatory Visit (INDEPENDENT_AMBULATORY_CARE_PROVIDER_SITE_OTHER): Payer: Medicare Other | Admitting: Vascular Surgery

## 2012-01-07 ENCOUNTER — Encounter: Payer: Self-pay | Admitting: Vascular Surgery

## 2012-01-07 ENCOUNTER — Ambulatory Visit (INDEPENDENT_AMBULATORY_CARE_PROVIDER_SITE_OTHER): Payer: Medicare Other

## 2012-01-07 VITALS — BP 141/66 | HR 80 | Temp 98.2°F | Resp 16 | Ht 62.0 in | Wt 124.0 lb

## 2012-01-07 DIAGNOSIS — I6529 Occlusion and stenosis of unspecified carotid artery: Secondary | ICD-10-CM

## 2012-01-07 DIAGNOSIS — Z48812 Encounter for surgical aftercare following surgery on the circulatory system: Secondary | ICD-10-CM

## 2012-01-07 DIAGNOSIS — G459 Transient cerebral ischemic attack, unspecified: Secondary | ICD-10-CM

## 2012-01-07 NOTE — Progress Notes (Signed)
Patient presents today for followup of her right carotid endarterectomy and Dacron patch angioplasty on 12/13/2011. She has resolved the postoperative soreness. She does have some numbness on her chin. He does report one episode while shopping approximately week ago with her left leg "giving out for a few minutes. She reports this is has happened in her right leg before as well with some hip disease. This was a very temporary and resolved completely.  Physical exam reveals well-healed incision in her right neck with no evidence of bruits.  Do to the episode of her left leg we did immature right carotid endarterectomy site and this showed wide patency with no evidence of technical issues.  Impression and plan stable followup after her right carotid endarterectomy. I again reviewed symptoms of carotid disease with her instrument possibly should skirt about it was irrigated 6 months with repeat carotid duplex followup

## 2012-01-07 NOTE — Addendum Note (Signed)
Addended by: Vanetta Shawl on: 01/07/2012 12:46 PM   Modules accepted: Orders

## 2012-01-08 ENCOUNTER — Other Ambulatory Visit: Payer: Medicare Other

## 2012-01-08 NOTE — Addendum Note (Signed)
Addended by: Sharee Pimple on: 01/08/2012 08:56 AM   Modules accepted: Orders

## 2012-01-09 ENCOUNTER — Ambulatory Visit: Payer: Medicare Other | Admitting: Internal Medicine

## 2012-01-16 ENCOUNTER — Other Ambulatory Visit (INDEPENDENT_AMBULATORY_CARE_PROVIDER_SITE_OTHER): Payer: Medicare Other

## 2012-01-16 ENCOUNTER — Other Ambulatory Visit: Payer: Self-pay

## 2012-01-16 DIAGNOSIS — I2589 Other forms of chronic ischemic heart disease: Secondary | ICD-10-CM

## 2012-01-16 DIAGNOSIS — I219 Acute myocardial infarction, unspecified: Secondary | ICD-10-CM

## 2012-02-03 ENCOUNTER — Telehealth: Payer: Self-pay | Admitting: Internal Medicine

## 2012-02-03 NOTE — Telephone Encounter (Signed)
Will contact patient, once results are reviewed by MD

## 2012-02-03 NOTE — Telephone Encounter (Signed)
PT CALLED [facility] ASKING FOR TEST RESULTS/TMJ

## 2012-03-18 HISTORY — PX: EYE SURGERY: SHX253

## 2012-04-21 ENCOUNTER — Ambulatory Visit: Payer: Medicare Other | Admitting: Vascular Surgery

## 2012-04-21 ENCOUNTER — Other Ambulatory Visit: Payer: Medicare Other

## 2012-07-13 ENCOUNTER — Encounter: Payer: Self-pay | Admitting: Vascular Surgery

## 2012-07-14 ENCOUNTER — Other Ambulatory Visit (INDEPENDENT_AMBULATORY_CARE_PROVIDER_SITE_OTHER): Payer: Medicare Other | Admitting: *Deleted

## 2012-07-14 ENCOUNTER — Encounter: Payer: Self-pay | Admitting: Vascular Surgery

## 2012-07-14 ENCOUNTER — Ambulatory Visit (INDEPENDENT_AMBULATORY_CARE_PROVIDER_SITE_OTHER): Payer: Medicare Other | Admitting: Vascular Surgery

## 2012-07-14 DIAGNOSIS — I6529 Occlusion and stenosis of unspecified carotid artery: Secondary | ICD-10-CM

## 2012-07-14 DIAGNOSIS — Z48812 Encounter for surgical aftercare following surgery on the circulatory system: Secondary | ICD-10-CM

## 2012-07-14 NOTE — Addendum Note (Signed)
Addended by: Adria Dill L on: 07/14/2012 04:15 PM   Modules accepted: Orders

## 2012-07-14 NOTE — Progress Notes (Signed)
The patient has today for followup of her right carotid endarterectomy and Dacron patch angioplasty on 12/13/2011. She continues to do well. She is quite active at her age of 79. She is here today with her daughter. She reports that she does have some Orella Cushman fatigue which is been present since her coronary bypass. She's had no focal neurologic deficits.  Past Medical History  Diagnosis Date  . Unspecified essential hypertension   . Edema   . Coronary atherosclerosis of native coronary artery     echocardiogram June 2012 ejection fraction 40% with apical and anteroseptal akinesis with scarring.  . Hemothorax on left   . Myocardial infarction of anterior wall greater than eight weeks ago     large anterior wall myocardial infarction status post emergent PCI followed by coronary bypass grafting.,  Ejection fraction 40%  . Carotid artery disease     followed by vascular surgery  Carotid duplex in 02/2010-60-79% right proximal internal carotid artery stenosis; plaque without focal stenosis on the left; no change compared with 08/30/2009.  . Transient atrial fibrillation or flutter     post myocardial infarction and she remains in normal sinus rhythm  . Coronary artery disease     Acute anteroseptal MI treated with urgent PCI and followed by CABG surgery for left main and severe three-vessel coronary disease in 07/2010; prior percutaneous intervention in 05/2007 with DES to the proximal LAD; RCA stent in 09/2009; both stents occluded as well as M1 prior to surgery  . CHF (congestive heart failure)   . Cataract     bilateral   . HOH (hard of hearing)   . GERD (gastroesophageal reflux disease)   . Shortness of breath     With ambulation  . Pneumonia   . Creatinine elevation   . Claustrophobia   . Arthritis     back    History  Substance Use Topics  . Smoking status: Never Smoker   . Smokeless tobacco: Never Used  . Alcohol Use: No    Family History  Problem Relation Age of Onset  . Cancer  Other   . Coronary artery disease Other   . Heart failure Father     chf  . Heart disease Father   . Hyperlipidemia Mother   . Hypertension Mother   . Aneurysm Mother     AAA  . Heart disease Mother   . Hyperlipidemia Sister   . Hypertension Sister   . Heart disease Sister     Heart Disease before age 59  . Heart attack Sister   . Hyperlipidemia Son   . Hypertension Son   . Heart disease Son   . Peripheral vascular disease Son     AMPUTATION  . Diabetes Son     Amputation Left foot    Allergies  Allergen Reactions  . Sulfonamide Derivatives Rash    Current outpatient prescriptions:aspirin 325 MG tablet, Take 325 mg by mouth daily.  , Disp: , Rfl: ;  atorvastatin (LIPITOR) 40 MG tablet, Take 40 mg by mouth daily. , Disp: , Rfl: ;  carvedilol (COREG) 12.5 MG tablet, Take 12.5 mg by mouth 2 (two) times daily., Disp: , Rfl: ;  furosemide (LASIX) 40 MG tablet, Take 20-40 mg by mouth daily. , Disp: , Rfl:  nitroGLYCERIN (NITROSTAT) 0.4 MG SL tablet, Place 0.4 mg under the tongue every 5 (five) minutes as needed. For chest pain, Disp: , Rfl: ;  pantoprazole (PROTONIX) 40 MG tablet, Take 40 mg by mouth daily.  ,  Disp: , Rfl:   BP 134/74  Pulse 71  Resp 16  Ht 5\' 2"  (1.575 m)  Wt 117 lb 14.4 oz (53.479 kg)  BMI 21.56 kg/m2  Body mass index is 21.56 kg/(m^2).       Physical exam: Well-developed well-nourished white female appearing stated age. She is grossly intact neurologically Carotid arteries without bruits bilaterally. Well-healed right neck incision Respirations the equal and nonlabored Heart regular rate and rhythm  Carotid duplex today reveals widely patent right endarterectomy with no evidence of restenosis. She has no significant stenosis in her left carotid system.  Impression and plan stable status post right carotid endarterectomy September 2013. She will continue her usual activities and see [place] in 6 months with repeat carotid duplex surveillance protocol. She  will notify should she have any neurologic deficits

## 2013-01-01 ENCOUNTER — Other Ambulatory Visit (HOSPITAL_COMMUNITY): Payer: Self-pay | Admitting: Vascular Surgery

## 2013-01-01 ENCOUNTER — Other Ambulatory Visit: Payer: Self-pay | Admitting: Vascular Surgery

## 2013-01-01 DIAGNOSIS — Z48812 Encounter for surgical aftercare following surgery on the circulatory system: Secondary | ICD-10-CM

## 2013-01-01 DIAGNOSIS — I6529 Occlusion and stenosis of unspecified carotid artery: Secondary | ICD-10-CM

## 2013-01-18 ENCOUNTER — Encounter: Payer: Self-pay | Admitting: Family

## 2013-01-19 ENCOUNTER — Ambulatory Visit: Payer: Medicare Other | Admitting: Vascular Surgery

## 2013-01-19 ENCOUNTER — Ambulatory Visit (INDEPENDENT_AMBULATORY_CARE_PROVIDER_SITE_OTHER): Payer: Medicare Other | Admitting: Family

## 2013-01-19 ENCOUNTER — Encounter: Payer: Self-pay | Admitting: Family

## 2013-01-19 ENCOUNTER — Other Ambulatory Visit: Payer: Medicare Other

## 2013-01-19 ENCOUNTER — Ambulatory Visit (HOSPITAL_COMMUNITY)
Admission: RE | Admit: 2013-01-19 | Discharge: 2013-01-19 | Disposition: A | Discharge status: Home / Self Care | Payer: Medicare Other | Source: Ambulatory Visit | Attending: Family | Admitting: Family

## 2013-01-19 VITALS — BP 130/72 | HR 64 | Resp 16 | Ht 61.0 in | Wt 116.0 lb

## 2013-01-19 DIAGNOSIS — I6529 Occlusion and stenosis of unspecified carotid artery: Secondary | ICD-10-CM

## 2013-01-19 DIAGNOSIS — Z48812 Encounter for surgical aftercare following surgery on the circulatory system: Secondary | ICD-10-CM

## 2013-01-19 NOTE — Patient Instructions (Signed)
Stroke Prevention Some medical conditions and behaviors are associated with an increased chance of having a stroke. You may prevent a stroke by making healthy choices and managing medical conditions. Reduce your risk of having a stroke by:  Staying physically active. Get at least 30 minutes of activity on most or all days.  Not smoking. It may also be helpful to avoid exposure to secondhand smoke.  Limiting alcohol use. Moderate alcohol use is considered to be:  No more than 2 drinks per day for men.  No more than 1 drink per day for nonpregnant women.  Eating healthy foods.  Include 5 or more servings of fruits and vegetables a day.  Certain diets may be prescribed to address high blood pressure, high cholesterol, diabetes, or obesity.  Managing your cholesterol levels.  A low-saturated fat, low-trans fat, low-cholesterol, and high-fiber diet may control cholesterol levels.  Take any prescribed medicines to control cholesterol as directed by your caregiver.  Managing your diabetes.  A controlled-carbohydrate, controlled-sugar diet is recommended to manage diabetes.  Take any prescribed medicines to control diabetes as directed by your caregiver.  Controlling your high blood pressure (hypertension).  A low-salt (sodium), low-saturated fat, low-trans fat, and low-cholesterol diet is recommended to manage high blood pressure.  Take any prescribed medicines to control hypertension as directed by your caregiver.  Maintaining a healthy weight.  A reduced-calorie, low-sodium, low-saturated fat, low-trans fat, low-cholesterol diet is recommended to manage weight.  Stopping drug abuse.  Avoiding birth control pills.  Talk to your caregiver about the risks of taking birth control pills if you are over 35 years old, smoke, get migraines, or have ever had a blood clot.  Getting evaluated for sleep disorders (sleep apnea).  Talk to your caregiver about getting a sleep evaluation  if you snore a lot or have excessive sleepiness.  Taking medicines as directed by your caregiver.  For some people, aspirin or blood thinners (anticoagulants) are helpful in reducing the risk of forming abnormal blood clots that can lead to stroke. If you have the irregular heart rhythm of atrial fibrillation, you should be on a blood thinner unless there is a good reason you cannot take them.  Understand all your medicine instructions. SEEK IMMEDIATE MEDICAL CARE IF:   You have sudden weakness or numbness of the face, arm, or leg, especially on one side of the body.  You have sudden confusion.  You have trouble speaking (aphasia) or understanding.  You have sudden trouble seeing in one or both eyes.  You have sudden trouble walking.  You have dizziness.  You have a loss of balance or coordination.  You have a sudden, severe headache with no known cause.  You have new chest pain or an irregular heartbeat. Any of these symptoms may represent a serious problem that is an emergency. Do not wait to see if the symptoms will go away. Get medical help right away. Call your local emergency services (911 in [place] Do not drive yourself to the hospital. Document Released: 04/11/2004 Document Revised: 05/27/2011 Document Reviewed: 10/22/2010 [employer] Patient Information 2014 [employer].  

## 2013-01-19 NOTE — Progress Notes (Signed)
Established Carotid Patient  Previous Carotid surgery: Yes Surgeon: Early  History of Present Illness  [name] is a 79 y.o. female patient of Dr. Arbie Cookey who is status post right carotid endarterectomy September 2013. Patient states that she never had a stroke or TIA symptoms. Had an MI in 2012 at [facility], [place], [place] while she was in the hospital for a colonoscopy; she was then transferred to [facility]. Patient denies claudication symptoms, denies non-healing wounds.  Patient denies New Medical or Surgical History.  Pt Diabetic: No Pt smoker: former smoker, quit 40 years ago  Pt meds include: Statin : Yes Betablocker: Yes ASA: Yes Other anticoagulants/antiplatelets: no   Past Medical History  Diagnosis Date  . Unspecified essential hypertension   . Edema   . Coronary atherosclerosis of native coronary artery     echocardiogram June 2012 ejection fraction 40% with apical and anteroseptal akinesis with scarring.  . Hemothorax on left   . Myocardial infarction of anterior wall greater than eight weeks ago     large anterior wall myocardial infarction status post emergent PCI followed by coronary bypass grafting.,  Ejection fraction 40%  . Carotid artery disease     followed by vascular surgery  Carotid duplex in 02/2010-60-79% right proximal internal carotid artery stenosis; plaque without focal stenosis on the left; no change compared with 08/30/2009.  . Transient atrial fibrillation or flutter     post myocardial infarction and she remains in normal sinus rhythm  . Coronary artery disease     Acute anteroseptal MI treated with urgent PCI and followed by CABG surgery for left main and severe three-vessel coronary disease in 07/2010; prior percutaneous intervention in 05/2007 with DES to the proximal LAD; RCA stent in 09/2009; both stents occluded as well as M1 prior to surgery  . CHF (congestive heart failure)   . Cataract     bilateral   . HOH (hard of  hearing)   . GERD (gastroesophageal reflux disease)   . Shortness of breath     With ambulation  . Pneumonia   . Creatinine elevation   . Claustrophobia   . Arthritis     back    Social History History  Substance Use Topics  . Smoking status: Never Smoker   . Smokeless tobacco: Never Used  . Alcohol Use: No    Family History Family History  Problem Relation Age of Onset  . Cancer Other   . Coronary artery disease Other   . Heart failure Father     chf  . Heart disease Father   . Hyperlipidemia Mother   . Hypertension Mother   . Aneurysm Mother     AAA  . Heart disease Mother   . Hyperlipidemia Sister   . Hypertension Sister   . Heart disease Sister     Heart Disease before age 41  . Heart attack Sister   . Hyperlipidemia Son   . Hypertension Son   . Heart disease Son   . Peripheral vascular disease Son     AMPUTATION  . Diabetes Son     Amputation Left foot    Surgical History Past Surgical History  Procedure Laterality Date  . Tonsillectomy    . Colonoscopy  2012  . Sternal wound exploration, evacuation of left hemothorax,  and sternal wou nd closure  08/13/10    Hendrickson  . Cabgx 3  08/09/10    [name]  . Cardiac catheterization  2001, 2008, 2009, 2012  .  Pr vein bypass graft,aorto-fem-pop  07/29/10  . Coronary artery bypass graft  08/08/2010    Triple bypass  . Appendectomy    . Hand surgery      right hand  . Endarterectomy  12/13/2011    Procedure: ENDARTERECTOMY CAROTID;  Surgeon: Larina Earthly, MD;  Location: [facility] OR;  Service: Vascular;  Laterality: Right;  Right Carotid endartectomy with dacron patch angioplasty  . Carotid endarterectomy      Allergies  Allergen Reactions  . Sulfonamide Derivatives Rash    Current Outpatient Prescriptions  Medication Sig Dispense Refill  . aspirin 325 MG tablet Take 325 mg by mouth daily.        Marland Kitchen atorvastatin (LIPITOR) 40 MG tablet Take 40 mg by mouth daily.       . carvedilol (COREG) 12.5 MG tablet Take  12.5 mg by mouth 2 (two) times daily.      . furosemide (LASIX) 40 MG tablet Take 20-40 mg by mouth daily.       . nitroGLYCERIN (NITROSTAT) 0.4 MG SL tablet Place 0.4 mg under the tongue every 5 (five) minutes as needed. For chest pain      . pantoprazole (PROTONIX) 40 MG tablet Take 40 mg by mouth daily.         No current facility-administered medications for this visit.    Review of Systems : [x]  Positive   [ ]  Denies  General:[ ]  Weight loss,  [ ]  Weight gain, [ ]  Loss of appetite, [ ]  Fever, [ ]  chills  Neurologic: [ ]  Dizziness, [ ]  Blackouts, [ ]  Headaches, [ ]  Seizure [ ]  Stroke, [ ]  "Mini stroke", [ ]  Slurred speech, [ ]  Temporary blindness;  [ ] weakness,  Ear/Nose/Throat: [ ]  Change in hearing, [ ]  Nose bleeds, [ ]  Hoarseness  Vascular:[ ]  Pain in legs with walking, [ ]  Pain in feet while lying flat , [ ]   Non-healing ulcer, [ ]  Blood clot in vein,    Pulmonary: [ ]  Home oxygen, [ ]   Productive cough, [ ]  Bronchitis, [ ]  Coughing up blood,  [ ]  Asthma, [ ]  Wheezing  Musculoskeletal:  [ ]  Arthritis, [ ]  Joint pain, [ ]  low back pain  Cardiac: [ ]  Chest pain, [ ]  Shortness of breath when lying flat, [ ]  Shortness of breath with exertion, [ ]  Palpitations, [ ]  Heart murmur, [ ]   Atrial fibrillation  Hematologic:[ ]  Easy Bruising, [ ]  Anemia; [ ]  Hepatitis  Psychiatric: [ ]   Depression, [ ]  Anxiety   Gastrointestinal: [ ]  Black stool, [ ]  Blood in stool, [ ]  Peptic ulcer disease,  [ ]  Gastroesophageal Reflux, [ ]  Trouble swallowing, [ ]  Diarrhea, [ ]  Constipation  Urinary: [ ]  chronic Kidney disease, [ ]  on HD, [ ]  Burning with urination, [ ]  Frequent urination, [ ]  Difficulty urinating;   Skin: [ ]  Rashes, [ ]  Wounds    Physical Examination  Filed Vitals:   01/19/13 1351  BP: 130/72  Pulse: 64  Resp:    Filed Weights   01/19/13 1349  Weight: 116 lb (52.617 kg)   Body mass index is 21.93 kg/(m^2).  General: WDWN female in NAD GAIT: normal Eyes:  PERRLA Pulmonary:  CTAB, Negative, negative  Rales, Negative rhonchi, & Negative wheezing.  Cardiac: regular Rhythm ,  Negative Murmurs, positive for split S2.  VASCULAR EXAM Carotid Bruits Left Right   Negative Negative    Aorta is not palpable. Radial pulses are 2+ palpable  and equal.                                                                                                                            LE Pulses LEFT RIGHT       POPLITEAL  not palpable   not palpable       POSTERIOR TIBIAL   palpable    palpable        DORSALIS PEDIS      ANTERIOR TIBIAL  palpable    palpable     Gastrointestinal: soft, nontender, BS WNL, no r/g,  negative masses.  Musculoskeletal: Negative muscle atrophy/wasting. M/S 3/5 throughout, Extremities without ischemic changes. Mild kyphosis.  Neurologic: A&O X 3; Appropriate Affect ; SENSATION ;normal;  Speech is normal CN 2-12 intact except hard of hearing, Pain and light touch intact in extremities, Motor exam as listed above.   Non-Invasive Vascular Imaging CAROTID DUPLEX 01/19/2013   Right ICA: <40% stenosis. Left ICA: <40% stenosis.  These findings are Unchanged from previous exam.  Assessment: [name] is a 79 y.o. female who presents with  Asymptomatic <40% Bilateral ICA  Stenosis. The  ICA stenosis is  Unchanged from previous exam. Brachial pressures are equal.  Plan: Follow-up in 1 year with Carotid Duplex scan.  I discussed in depth with the patient the nature of atherosclerosis, and emphasized the importance of maximal medical management including strict control of blood pressure, blood glucose, and lipid levels, obtaining regular exercise, and cessation of smoking.  The patient is aware that without maximal medical management the underlying atherosclerotic disease process will progress, limiting the benefit of any interventions. The patient was given information about stroke prevention and what symptoms should  prompt the patient to seek immediate medical care. Thank you for allowing [place] to participate in this patient's care.  Charisse March, RN, MSN, FNP-C [facility] Office: [phone]  Clinic Physician: Early  01/19/2013 12:25 PM

## 2013-04-20 IMAGING — CR DG CHEST 1V PORT
1 series · 1 of 1 positions shown · non-contrast
Comparison: 08/20/2010.

CLINICAL DATA: CABG.  Congestion.

PORTABLE CHEST - 1 VIEW

[view not recorded]
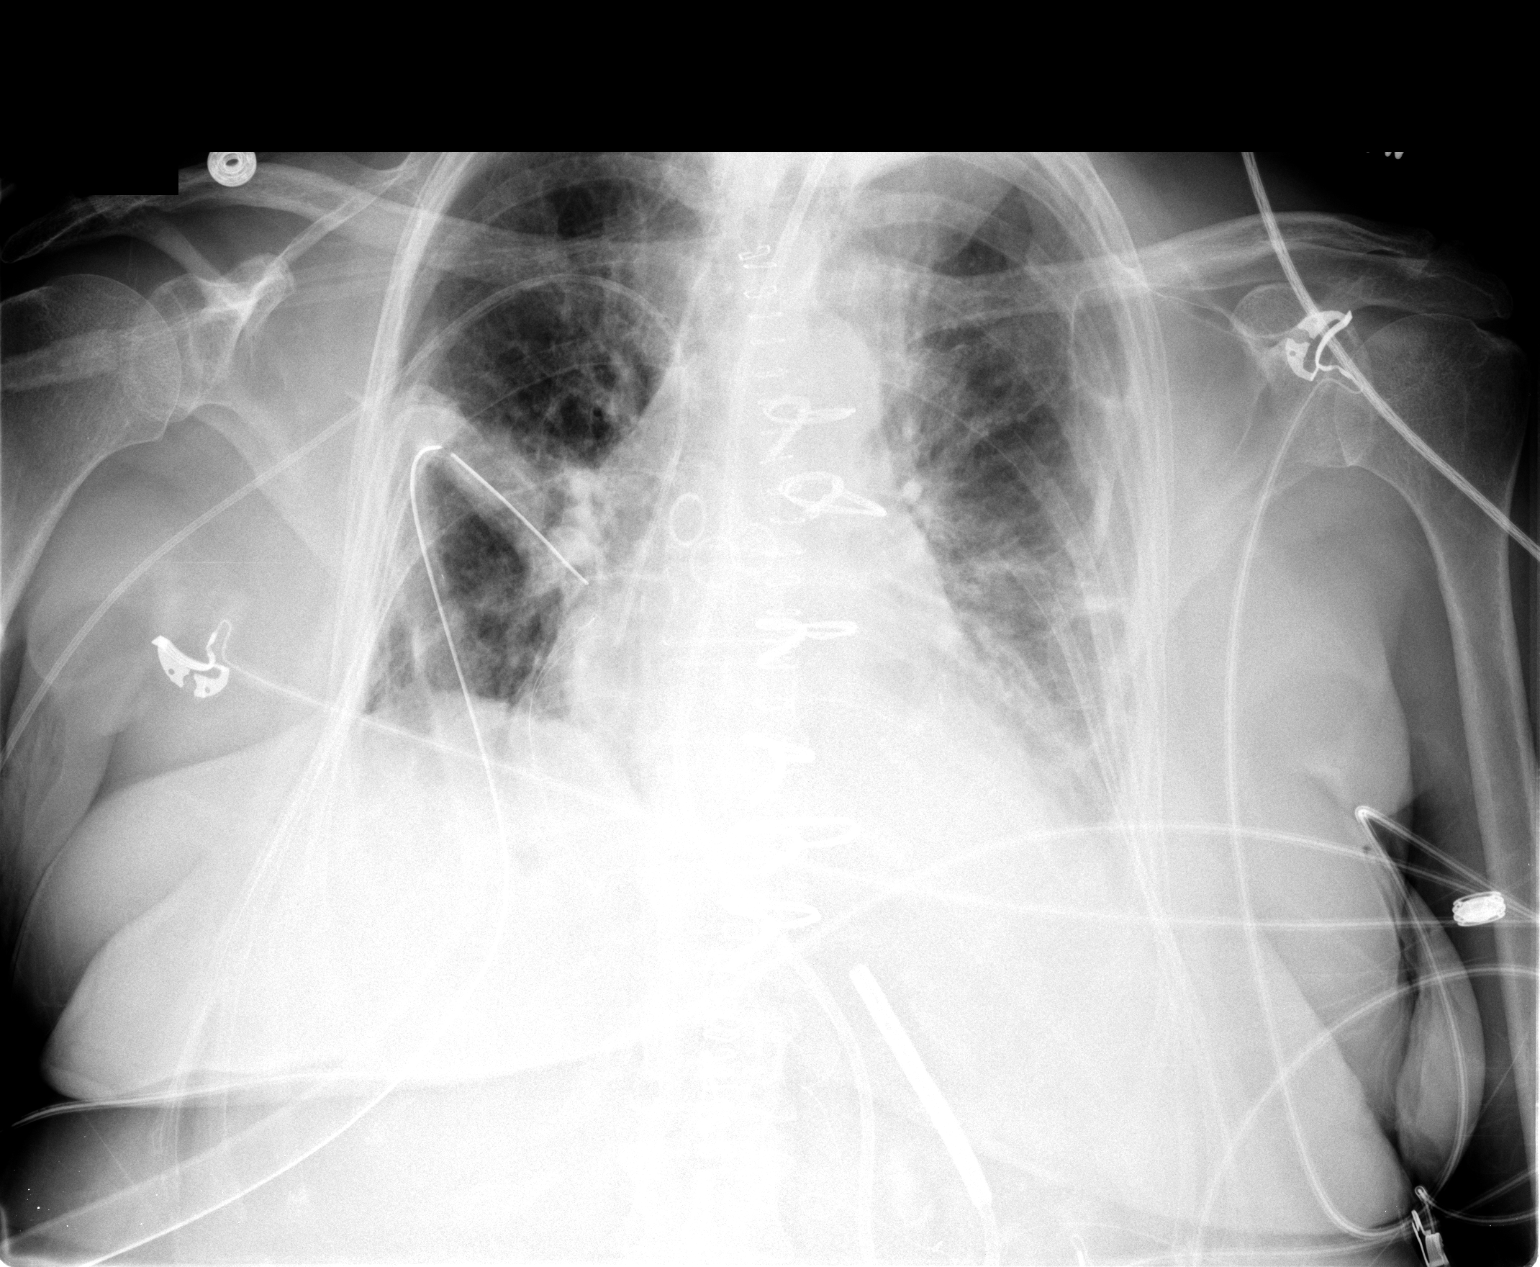

[1 of 1 positions shown; findings below may reference images not displayed]

FINDINGS: Right thoracostomy tube, right upper extremity PICC and
feeding tube are unchanged.  The appearance of the
cardiopericardial silhouette and lungs is also unchanged.  No
pneumothorax.  Patient is rotated to the left. Stable basilar
airspace disease and atelectasis.
IMPRESSION: 1.  Stable support apparatus.
2.  No interval change in the heart and lungs.

## 2013-04-21 IMAGING — CR DG CHEST 1V PORT
1 series · 1 of 1 positions shown · non-contrast
Comparison: Chest x-ray 08/22/2010 at 2822 hours.

CLINICAL DATA: Vomiting blood.  2 weeks post heart surgery.

PORTABLE CHEST - 1 VIEW

[AP]
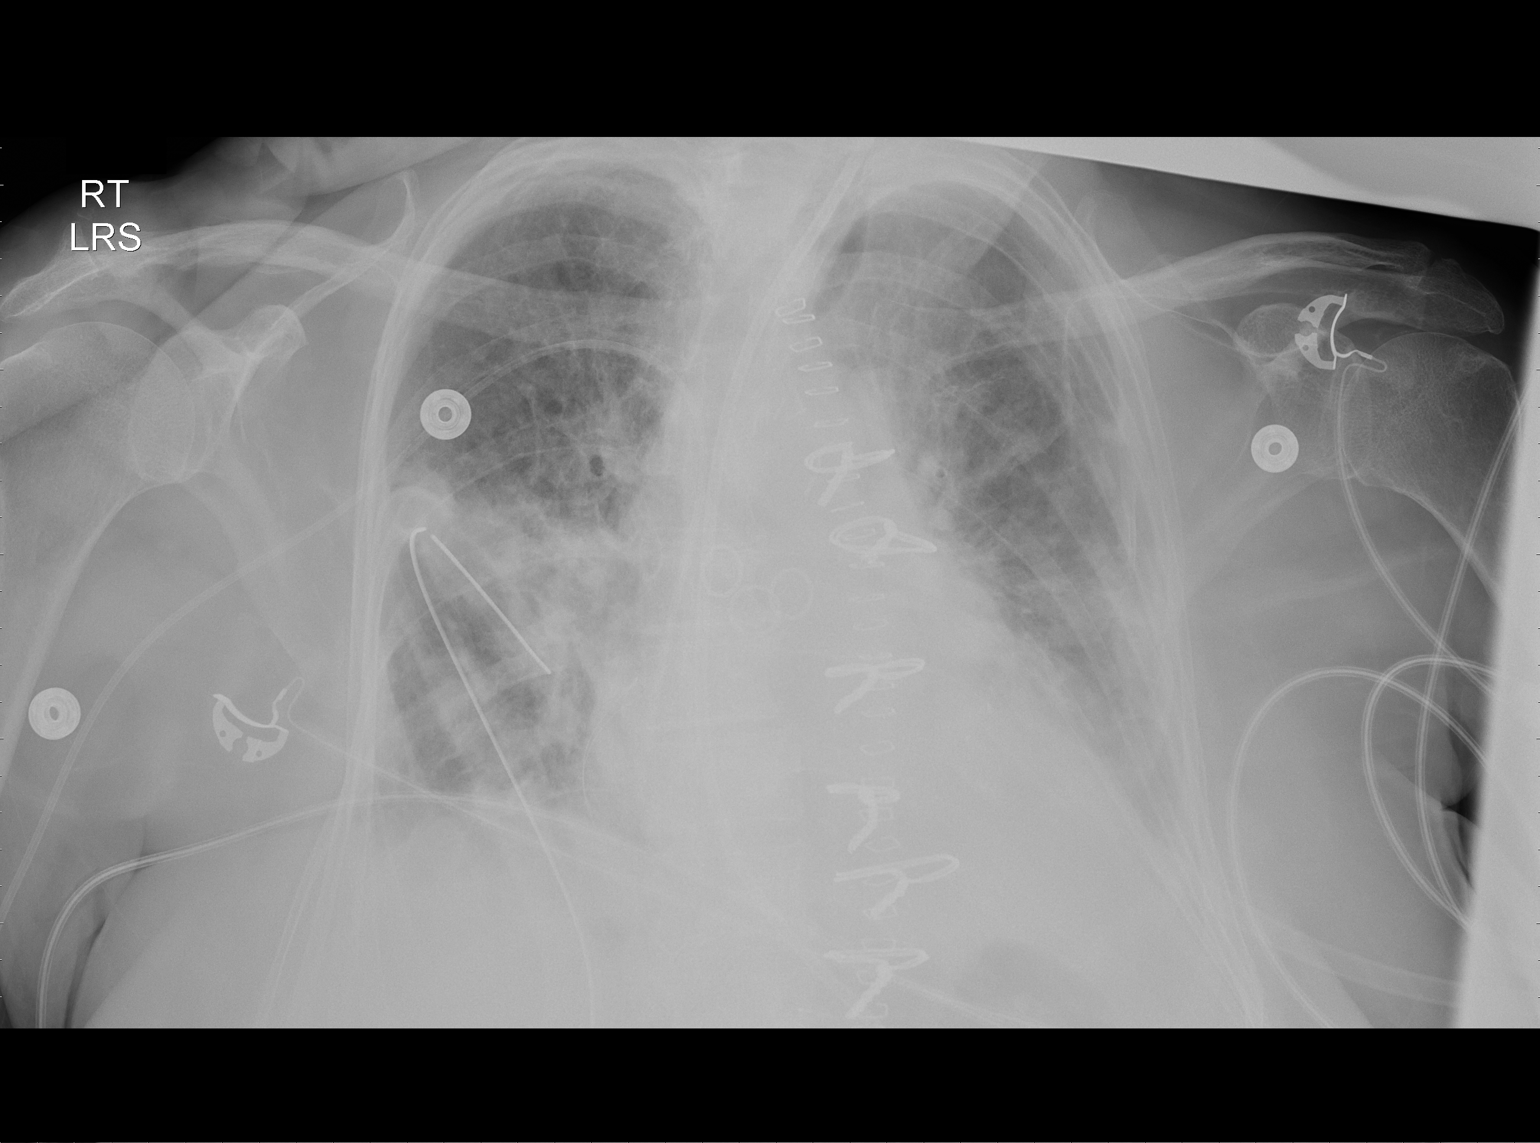

[1 of 1 positions shown; findings below may reference images not displayed]

FINDINGS: The right-sided chest tube is stable.  The right PICC
line is unchanged.  There is a feeding tube in the stomach.  Slight
worsening lung aeration with central vascular congestion and
probable perihilar pulmonary edema.  Small bilateral pleural
effusions are suspected.  No pneumothorax.
IMPRESSION: 1.  Slight worsening lung aeration with central vascular congestion
and probable perihilar pulmonary edema.
2.  Stable support apparatus.

## 2013-04-22 IMAGING — CR DG CHEST 1V PORT
1 series · 1 of 1 positions shown · non-contrast
Comparison: 1 day prior

CLINICAL DATA: CABG

PORTABLE CHEST - 1 VIEW

[view not recorded]
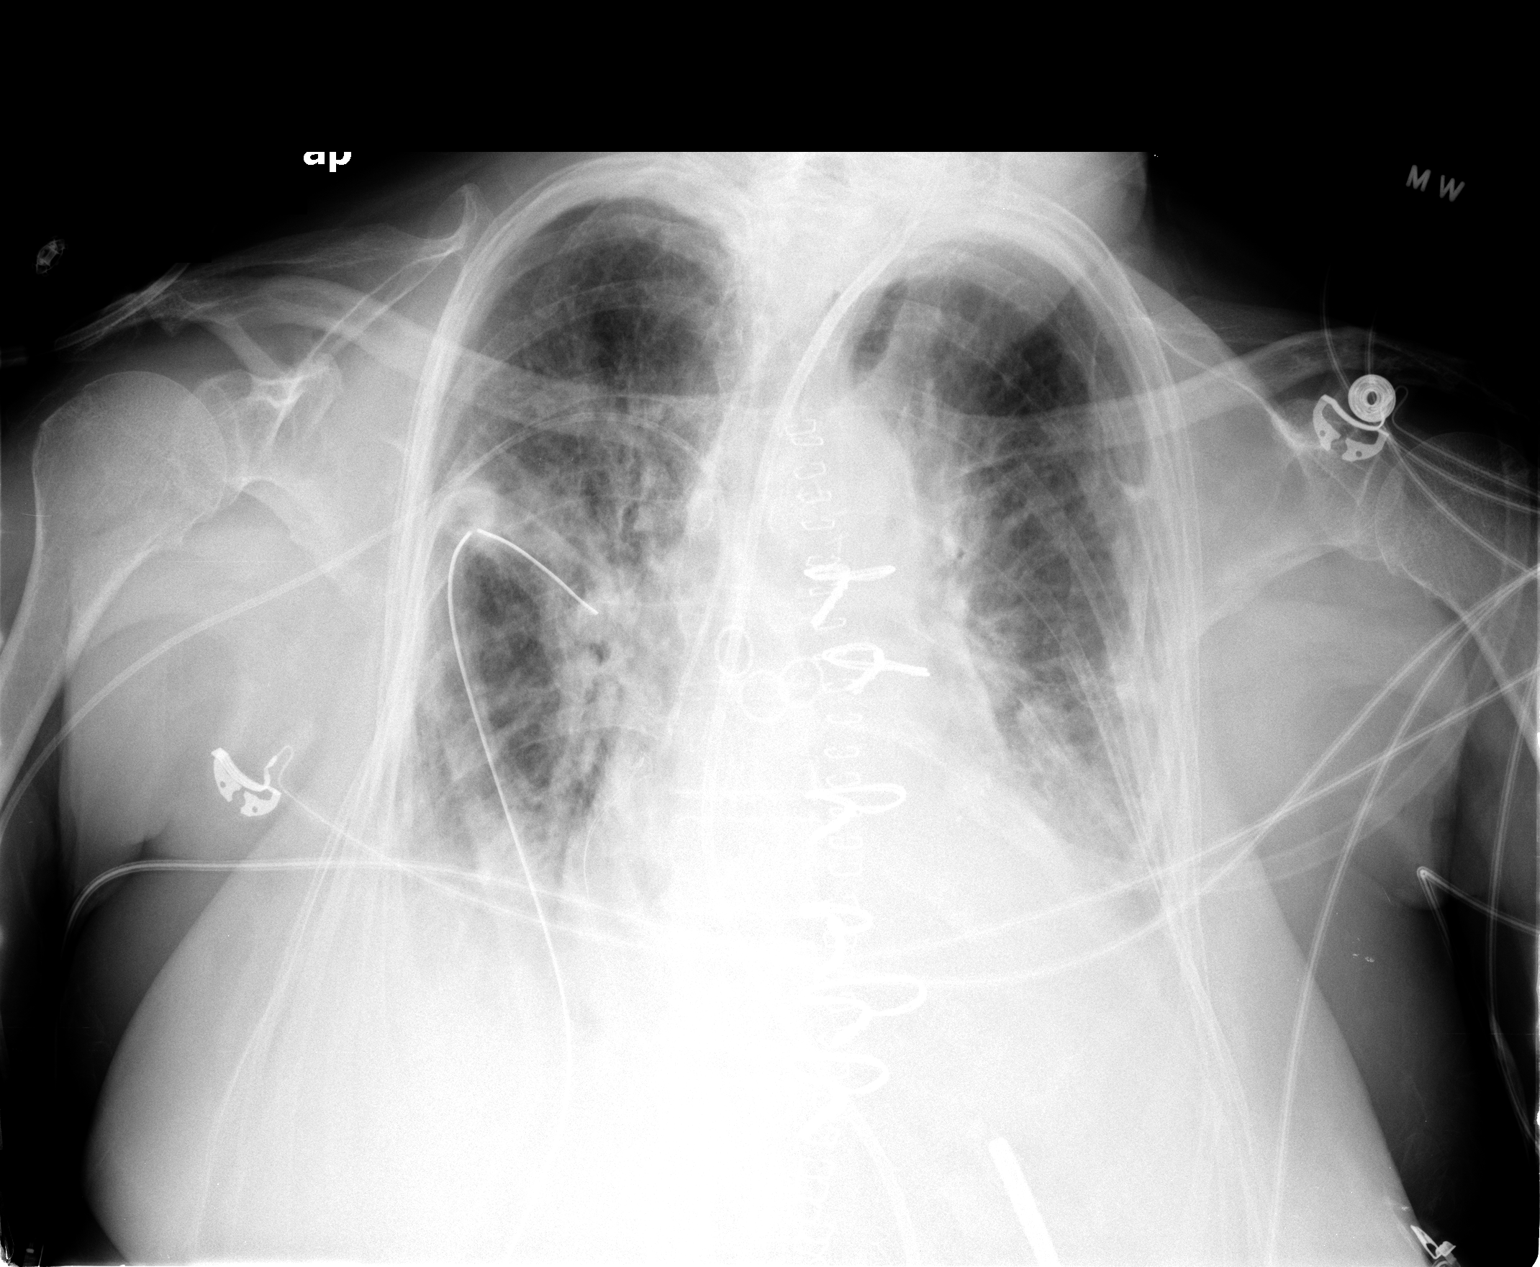

[1 of 1 positions shown; findings below may reference images not displayed]

FINDINGS: A feeding tube is looped in the stomach with tip at the
proximal stomach.  Right-sided PICC line terminates at the
cavoatrial junction.  Right-sided chest tube remains in place.
Prior median sternotomy.

Mild cardiomegaly.  Improvement in mild interstitial edema.  Small
bilateral pleural effusions are again identified. No pneumothorax.
Similar left and slight increase in right base atelectasis.
IMPRESSION: 1.  Slight improvement in mild interstitial edema.
2.  Bilateral pleural effusions and bibasilar atelectasis.

## 2014-01-24 ENCOUNTER — Encounter: Payer: Self-pay | Admitting: Family

## 2014-01-25 ENCOUNTER — Ambulatory Visit (HOSPITAL_COMMUNITY)
Admission: RE | Admit: 2014-01-25 | Discharge: 2014-01-25 | Disposition: A | Discharge status: Home / Self Care | Payer: Medicare Other | Source: Ambulatory Visit | Attending: Family | Admitting: Family

## 2014-01-25 ENCOUNTER — Ambulatory Visit (INDEPENDENT_AMBULATORY_CARE_PROVIDER_SITE_OTHER): Payer: Medicare Other | Admitting: Family

## 2014-01-25 ENCOUNTER — Encounter: Payer: Self-pay | Admitting: Family

## 2014-01-25 VITALS — BP 152/64 | HR 58 | Resp 14 | Ht 60.0 in | Wt 114.0 lb

## 2014-01-25 DIAGNOSIS — Z48812 Encounter for surgical aftercare following surgery on the circulatory system: Secondary | ICD-10-CM

## 2014-01-25 DIAGNOSIS — Z9889 Other specified postprocedural states: Secondary | ICD-10-CM

## 2014-01-25 DIAGNOSIS — I6523 Occlusion and stenosis of bilateral carotid arteries: Secondary | ICD-10-CM

## 2014-01-25 NOTE — Patient Instructions (Signed)
Stroke Prevention Some medical conditions and behaviors are associated with an increased chance of having a stroke. You may prevent a stroke by making healthy choices and managing medical conditions. HOW CAN I REDUCE MY RISK OF HAVING A STROKE?   Stay physically active. Get at least 30 minutes of activity on most or all days.  Do not smoke. It may also be helpful to avoid exposure to secondhand smoke.  Limit alcohol use. Moderate alcohol use is considered to be:  No more than 2 drinks per day for men.  No more than 1 drink per day for nonpregnant women.  Eat healthy foods. This involves:  Eating 5 or more servings of fruits and vegetables a day.  Making dietary changes that address high blood pressure (hypertension), high cholesterol, diabetes, or obesity.  Manage your cholesterol levels.  Making food choices that are high in fiber and low in saturated fat, trans fat, and cholesterol may control cholesterol levels.  Take any prescribed medicines to control cholesterol as directed by your health care provider.  Manage your diabetes.  Controlling your carbohydrate and sugar intake is recommended to manage diabetes.  Take any prescribed medicines to control diabetes as directed by your health care provider.  Control your hypertension.  Making food choices that are low in salt (sodium), saturated fat, trans fat, and cholesterol is recommended to manage hypertension.  Take any prescribed medicines to control hypertension as directed by your health care provider.  Maintain a healthy weight.  Reducing calorie intake and making food choices that are low in sodium, saturated fat, trans fat, and cholesterol are recommended to manage weight.  Stop drug abuse.  Avoid taking birth control pills.  Talk to your health care provider about the risks of taking birth control pills if you are over 35 years old, smoke, get migraines, or have ever had a blood clot.  Get evaluated for sleep  disorders (sleep apnea).  Talk to your health care provider about getting a sleep evaluation if you snore a lot or have excessive sleepiness.  Take medicines only as directed by your health care provider.  For some people, aspirin or blood thinners (anticoagulants) are helpful in reducing the risk of forming abnormal blood clots that can lead to stroke. If you have the irregular heart rhythm of atrial fibrillation, you should be on a blood thinner unless there is a good reason you cannot take them.  Understand all your medicine instructions.  Make sure that other conditions (such as anemia or atherosclerosis) are addressed. SEEK IMMEDIATE MEDICAL CARE IF:   You have sudden weakness or numbness of the face, arm, or leg, especially on one side of the body.  Your face or eyelid droops to one side.  You have sudden confusion.  You have trouble speaking (aphasia) or understanding.  You have sudden trouble seeing in one or both eyes.  You have sudden trouble walking.  You have dizziness.  You have a loss of balance or coordination.  You have a sudden, severe headache with no known cause.  You have new chest pain or an irregular heartbeat. Any of these symptoms may represent a serious problem that is an emergency. Do not wait to see if the symptoms will go away. Get medical help at once. Call your local emergency services (911 in [place] Do not drive yourself to the hospital. Document Released: 04/11/2004 Document Revised: 07/19/2013 Document Reviewed: 09/04/2012 Exit[employer] Patient Information 2015 [employer]. This information is not intended to replace advice given   to you by your health care provider. Make sure you discuss any questions you have with your health care provider.  

## 2014-01-25 NOTE — Addendum Note (Signed)
Addended by: Sharee Pimple on: 01/25/2014 11:54 AM   Modules accepted: Orders

## 2014-01-25 NOTE — Progress Notes (Signed)
Established Carotid Patient   History of Present Illness  [name] is a 80 y.o. female patient of Dr. Arbie CookeyEarly who is status post right carotid endarterectomy September 2013. She returns today for follow up. Patient states that she never had a stroke or TIA symptoms, specifically the patient denies a history of amaurosis fugax or monocular blindness, denies a history unilateral  of facial drooping, denies a history of hemiplegia, and denies a history of receptive or expressive aphasia.   She had an [facility] [place], [place] while she was in the hospital for a colonoscopy; she was then [facility] where she had a 3 vessel CABG. Patient denies claudication symptoms, denies non-healing wounds.  Her son died December, 2014 of DM and CRF complications. Her daughter died August, 2015 of metastatic cancer.  Patient denies New Medical or Surgical History.  Pt Diabetic: No Pt smoker: former smoker, quit in the 1980's  Pt meds include: Statin : Yes Betablocker: Yes ASA: Yes Other anticoagulants/antiplatelets: no  Past Medical History  Diagnosis Date  . Unspecified essential hypertension   . Edema   . Coronary atherosclerosis of native coronary artery     echocardiogram June 2012 ejection fraction 40% with apical and anteroseptal akinesis with scarring.  . Hemothorax on left   . Myocardial infarction of anterior wall greater than eight weeks ago     large anterior wall myocardial infarction status post emergent PCI followed by coronary bypass grafting.,  Ejection fraction 40%  . Carotid artery disease     followed by vascular surgery  Carotid duplex in 02/2010-60-79% right proximal internal carotid artery stenosis; plaque without focal stenosis on the left; no change compared with 08/30/2009.  . Transient atrial fibrillation or flutter     post myocardial infarction and she remains in normal sinus rhythm  . Coronary artery disease     Acute  anteroseptal MI treated with urgent PCI and followed by CABG surgery for left main and severe three-vessel coronary disease in 07/2010; prior percutaneous intervention in 05/2007 with DES to the proximal LAD; RCA stent in 09/2009; both stents occluded as well as M1 prior to surgery  . CHF (congestive heart failure)   . Cataract     bilateral   . HOH (hard of hearing)   . GERD (gastroesophageal reflux disease)   . Shortness of breath     With ambulation  . Pneumonia   . Creatinine elevation   . Claustrophobia   . Arthritis     back    Social History History  Substance Use Topics  . Smoking status: Never Smoker   . Smokeless tobacco: Never Used  . Alcohol Use: No    Family History Family History  Problem Relation Age of Onset  . Cancer Other   . Coronary artery disease Other   . Heart failure Father     chf  . Heart disease Father   . Hyperlipidemia Mother   . Hypertension Mother   . Aneurysm Mother     AAA  . Heart disease Mother   . Hyperlipidemia Sister   . Hypertension Sister   . Heart disease Sister     Heart Disease before age 78  . Heart attack Sister   . Hyperlipidemia Son   . Hypertension Son   . Heart disease Son   . Peripheral vascular disease Son     AMPUTATION  . Diabetes Son     Amputation Left foot  Surgical History Past Surgical History  Procedure Laterality Date  . Tonsillectomy    . Colonoscopy  2012  . Sternal wound exploration, evacuation of left hemothorax,  and sternal wou nd closure  08/13/10    Hendrickson  . Cabgx 3  08/09/10    [name]  . Cardiac catheterization  2001, 2008, 2009, 2012  . Pr vein bypass graft,aorto-fem-pop  07/29/10  . Coronary artery bypass graft  08/08/2010    Triple bypass  . Appendectomy    . Hand surgery      right hand  . Endarterectomy  12/13/2011    Procedure: ENDARTERECTOMY CAROTID;  Surgeon: Larina Earthly, MD;  Location: [facility] OR;  Service: Vascular;  Laterality: Right;  Right Carotid endartectomy with dacron  patch angioplasty  . Carotid endarterectomy    . Eye surgery Bilateral 2014    cataract lens implant    Allergies  Allergen Reactions  . Sulfonamide Derivatives Rash    Current Outpatient Prescriptions  Medication Sig Dispense Refill  . aspirin 325 MG tablet Take 325 mg by mouth daily.      Marland Kitchen atorvastatin (LIPITOR) 40 MG tablet Take 40 mg by mouth daily.     . carvedilol (COREG) 12.5 MG tablet Take 12.5 mg by mouth 2 (two) times daily.    . furosemide (LASIX) 40 MG tablet Take 20-40 mg by mouth daily.     . nitroGLYCERIN (NITROSTAT) 0.4 MG SL tablet Place 0.4 mg under the tongue every 5 (five) minutes as needed. For chest pain    . pantoprazole (PROTONIX) 40 MG tablet Take 40 mg by mouth daily.       No current facility-administered medications for this visit.    Review of Systems : See HPI for pertinent positives and negatives.  Physical Examination  Filed Vitals:   01/25/14 1045 01/25/14 1048  BP: 145/78 152/64  Pulse: 62 58  Resp:  14  Height:  5' (1.524 m)  Weight:  114 lb (51.71 kg)  SpO2:  100%   Body mass index is 22.26 kg/(m^2).  General: WDWN female in NAD GAIT: normal Eyes: PERRLA Pulmonary: CTAB, Negative, negative Rales, Negative rhonchi, & Negative wheezing.  Cardiac: regular Rhythm , Negative Murmurs, positive for split S2.  VASCULAR EXAM Carotid Bruits Left Right   Negative Negative   Aorta is not palpable. Radial pulses are 2+ palpable and equal.      LE Pulses LEFT RIGHT   POPLITEAL not palpable  not palpable   POSTERIOR TIBIAL  palpable   palpable    DORSALIS PEDIS  ANTERIOR TIBIAL palpable   palpable     Gastrointestinal: soft, nontender, BS WNL, no r/g, negative masses.  Musculoskeletal: Negative muscle atrophy/wasting. M/S 3/5 throughout, Extremities  without ischemic changes. Mild kyphosis.  Neurologic: A&O X 3; Appropriate Affect ; SENSATION ;normal;  Speech is normal CN 2-12 intact except hard of hearing, Pain and light touch intact in extremities, Motor exam as listed above.    Non-Invasive Vascular Imaging CAROTID DUPLEX 01/25/2014   Right ICA: patent CEA site with <40% ICA stenosis Left ICA: <40% stenosis.  These findings are Unchanged from previous exam.   Assessment: [name] is a 80 y.o. female who is status post right carotid endarterectomy September 2013. She presents with asymptomatic patent right CEA site with <40% ICA stenosis and <40% left ICA stenosis. The  ICA stenosis is  Unchanged from previous exam.  Plan: Follow-up in 1 year with Carotid Duplex scan.   I discussed  in depth with the patient the nature of atherosclerosis, and emphasized the importance of maximal medical management including strict control of blood pressure, blood glucose, and lipid levels, obtaining regular exercise, and continued cessation of smoking.  The patient is aware that without maximal medical management the underlying atherosclerotic disease process will progress, limiting the benefit of any interventions. The patient was given information about stroke prevention and what symptoms should prompt the patient to seek immediate medical care. Thank you for allowing [place] to participate in this patient's care.  Charisse March, RN, MSN, FNP-C [facility] Office: [phone]  Clinic Physician: Early  01/25/2014 9:58 AM

## 2014-07-28 ENCOUNTER — Ambulatory Visit: Payer: Medicare Other | Admitting: Cardiology

## 2014-07-29 ENCOUNTER — Encounter: Payer: Self-pay | Admitting: *Deleted

## 2014-08-02 ENCOUNTER — Encounter: Payer: Self-pay | Admitting: Cardiovascular Disease

## 2014-08-02 ENCOUNTER — Ambulatory Visit (INDEPENDENT_AMBULATORY_CARE_PROVIDER_SITE_OTHER): Payer: Medicare Other | Admitting: Cardiovascular Disease

## 2014-08-02 VITALS — BP 151/83 | HR 71 | Ht 62.0 in | Wt 122.0 lb

## 2014-08-02 DIAGNOSIS — I2510 Atherosclerotic heart disease of native coronary artery without angina pectoris: Secondary | ICD-10-CM

## 2014-08-02 DIAGNOSIS — I519 Heart disease, unspecified: Secondary | ICD-10-CM

## 2014-08-02 DIAGNOSIS — I6523 Occlusion and stenosis of bilateral carotid arteries: Secondary | ICD-10-CM

## 2014-08-02 DIAGNOSIS — I255 Ischemic cardiomyopathy: Secondary | ICD-10-CM

## 2014-08-02 DIAGNOSIS — I10 Essential (primary) hypertension: Secondary | ICD-10-CM | POA: Diagnosis not present

## 2014-08-02 DIAGNOSIS — R0602 Shortness of breath: Secondary | ICD-10-CM

## 2014-08-02 DIAGNOSIS — E785 Hyperlipidemia, unspecified: Secondary | ICD-10-CM | POA: Diagnosis not present

## 2014-08-02 DIAGNOSIS — I5022 Chronic systolic (congestive) heart failure: Secondary | ICD-10-CM | POA: Diagnosis not present

## 2014-08-02 NOTE — Patient Instructions (Signed)
   Chest x-ray  Labs for BMET, Lipids, CBC - Reminder:  Nothing to eat or drink after 12 midnight prior to labs. Office will contact with results via phone or letter.   Continue all medications. Follow up in  1 month

## 2014-08-02 NOTE — Progress Notes (Signed)
Patient ID: [name], female   DOB: [birth year], 81 y.o.   MRN: [id number]      SUBJECTIVE: The patient is an 81 year old woman who presents for cardiovascular follow-up. It is my first time meeting her. She has a history of a large anterior wall myocardial infarction and several coronary artery stents, but eventually needed coronary artery bypass graft surgery which occurred in May 2012. She also has a history of right carotid endarterectomy and follows with vascular surgery. She has an ischemic cardiomyopathy. The most recent echocardiogram I find is dated 04/12/13 which showed an EF of 30-35% with mild LV dilatation, diastolic dysfunction (grade I?), moderate mitral regurgitation, and severely elevated pulmonary pressures (65-75 mmHg), interpreted by Dr. Johny Shears. She last saw Dr. Ladona Ridgel for AICD consideration in 2013.  ECG performed in the office today demonstrates old anteroseptal and high lateral infarct, right bundle branch block, and left anterior fascicular block.  She had some shortness of breath last week and thought she was in congestive heart failure but did not go see a healthcare provider. She said she rarely gets short of breath and denies chest pain. She blames her dyspnea on deconditioning and a lack of exercise. 2 weeks ago she had a right-sided headache but no visual disturbances and no arm or leg numbness. She said she was upset before hand and this may have prompted her headache. 2 of her children died last year, one daughter of cancer.  Review of Systems: As per "subjective", otherwise negative.  Allergies  Allergen Reactions  . Sulfonamide Derivatives Rash and Other (See Comments)    Severe Headache    Current Outpatient Prescriptions  Medication Sig Dispense Refill  . aspirin 325 MG tablet Take 325 mg by mouth daily.      Marland Kitchen atorvastatin (LIPITOR) 40 MG tablet Take 40 mg by mouth daily.     . carvedilol (COREG) 12.5 MG tablet Take 12.5 mg by mouth 2 (two)  times daily.    . furosemide (LASIX) 40 MG tablet Take 20-40 mg by mouth daily.     . nitroGLYCERIN (NITROSTAT) 0.4 MG SL tablet Place 0.4 mg under the tongue every 5 (five) minutes as needed. For chest pain    . pantoprazole (PROTONIX) 40 MG tablet Take 40 mg by mouth daily.       No current facility-administered medications for this visit.    Past Medical History  Diagnosis Date  . Unspecified essential hypertension   . Edema   . Coronary atherosclerosis of native coronary artery     echocardiogram June 2012 ejection fraction 40% with apical and anteroseptal akinesis with scarring.  . Hemothorax on left   . Myocardial infarction of anterior wall greater than eight weeks ago     large anterior wall myocardial infarction status post emergent PCI followed by coronary bypass grafting.,  Ejection fraction 40%  . Carotid artery disease     followed by vascular surgery  Carotid duplex in 02/2010-60-79% right proximal internal carotid artery stenosis; plaque without focal stenosis on the left; no change compared with 08/30/2009.  . Transient atrial fibrillation or flutter     post myocardial infarction and she remains in normal sinus rhythm  . Coronary artery disease     Acute anteroseptal MI treated with urgent PCI and followed by CABG surgery for left main and severe three-vessel coronary disease in 07/2010; prior percutaneous intervention in 05/2007 with DES to the proximal LAD; RCA stent in 09/2009; both stents occluded as well as  M1 prior to surgery  . CHF (congestive heart failure)   . Cataract     bilateral   . HOH (hard of hearing)   . GERD (gastroesophageal reflux disease)   . Shortness of breath     With ambulation  . Pneumonia   . Creatinine elevation   . Claustrophobia   . Arthritis     back    Past Surgical History  Procedure Laterality Date  . Tonsillectomy    . Colonoscopy  2012  . Sternal wound exploration, evacuation of left hemothorax,  and sternal wou nd closure   08/13/10    Hendrickson  . Cabgx 3  08/09/10    [name]  . Cardiac catheterization  2001, 2008, 2009, 2012  . Pr vein bypass graft,aorto-fem-pop  07/29/10  . Coronary artery bypass graft  08/08/2010    Triple bypass  . Appendectomy    . Hand surgery      right hand  . Endarterectomy  12/13/2011    Procedure: ENDARTERECTOMY CAROTID;  Surgeon: Larina Earthly, MD;  Location: [facility] OR;  Service: Vascular;  Laterality: Right;  Right Carotid endartectomy with dacron patch angioplasty  . Carotid endarterectomy    . Eye surgery Bilateral 2014    cataract lens implant    History   Social History  . Marital Status: Widowed    Spouse Name: N/A  . Number of Children: N/A  . Years of Education: N/A   Occupational History  . Not on file.   Social History Main Topics  . Smoking status: Never Smoker   . Smokeless tobacco: Never Used  . Alcohol Use: No  . Drug Use: No  . Sexual Activity: Not on file   Other Topics Concern  . Not on file   Social History Narrative   Regular exercise- yes      Filed Vitals:   08/02/14 0948  BP: 151/83  Pulse: 71  Height: 5\' 2"  (1.575 m)  Weight: 122 lb (55.339 kg)  SpO2: 98%    PHYSICAL EXAM General: NAD HEENT: Normal. Neck: No JVD, no thyromegaly. Lungs: Faint bibasilar rales, no wheezes. CV: Regular rate and rhythm, normal S1/S2, no S3/S4, soft 1/6 systolic murmur along left sternal border. Trivial pretibial and periankle edema b/l.  No carotid bruit.   Abdomen: Soft, nontender, no hepatosplenomegaly, no distention.  Neurologic: Alert and oriented x 3.  Psych: Normal affect. Skin: Normal. Musculoskeletal: No gross deformities. Extremities: No clubbing or cyanosis.   ECG: Most recent ECG reviewed.      ASSESSMENT AND PLAN: 1. CAD s/p CABG: Symptomatically stable. Continue ASA, Lipitor, and Coreg. Unclear why not on ACEI (CKD?). Will obtain BMET to assess. EF 30-35%.  2. Ischemic cardiomyopathy with chronic systolic heart failure: EF  30-35%. On Coreg, not on ACEI for unclear reasons (perhaps CKD). Will obtain BMET to assess. Will obtain chest xray and CBC as well. Did not get AICD. May consider having her follow up with Dr. Ladona Ridgel, but echo will need to be repeated.  3. PVD and s/p right carotid endarterectomy: Last Duplex in 01/2014 showed patent right CEA with <40% RICA and LICA stenosis. Follows with vascular surgery.  4. Essential HTN: Mildly elevated. Will monitor and check BMET to see if ACEI can be added.  Dispo: f/u 1 month.  Time spent: 40 minutes, of which greater than 50% was spent reviewing symptoms, relevant blood tests and studies, and discussing management plan with the patient.   Prentice Docker, M.D., F.A.C.C.

## 2014-08-04 ENCOUNTER — Telehealth: Payer: Self-pay | Admitting: *Deleted

## 2014-08-04 DIAGNOSIS — R609 Edema, unspecified: Secondary | ICD-10-CM

## 2014-08-04 DIAGNOSIS — I10 Essential (primary) hypertension: Secondary | ICD-10-CM

## 2014-08-04 MED ORDER — POTASSIUM CHLORIDE CRYS ER 20 MEQ PO TBCR: EXTENDED_RELEASE_TABLET | ORAL | Status: DC

## 2014-08-04 MED ORDER — LISINOPRIL 2.5 MG PO TABS: 2.5000 mg | ORAL_TABLET | Freq: Every day | ORAL | Status: DC

## 2014-08-04 NOTE — Telephone Encounter (Signed)
LABS -   Notes Recorded by Laqueta Linden, MD on 08/04/2014 at 10:12 AM Lipids good. Renal function allows for initiation of ACEI, which will help improve heart function. Start lisinopril 2.5 mg daily and repeat BMET 2 weeks thereafter

## 2014-08-04 NOTE — Telephone Encounter (Signed)
Patient notified & verbalized understanding.  Will send new medications to [facility] in [place].  Also, will fax lab order to [facility].  She will do this around June 2nd or 3rd, 2016.

## 2014-08-04 NOTE — Telephone Encounter (Signed)
CHEST X-RAY  -   Notes Recorded by Laqueta Linden, MD on 08/04/2014 at 1:35 PM CXR confirms CHF. Have her take Lasix 40 mg bid x 3 days, then resume 40 mg daily. Give 40 meq KCl daily when taking Lasix 40 mg bid, then reduce to 20 meq daily when taking Lasix 40 mg daily.

## 2014-09-13 ENCOUNTER — Encounter: Payer: Self-pay | Admitting: Cardiovascular Disease

## 2014-09-13 ENCOUNTER — Ambulatory Visit (INDEPENDENT_AMBULATORY_CARE_PROVIDER_SITE_OTHER): Payer: Medicare Other | Admitting: Cardiovascular Disease

## 2014-09-13 VITALS — BP 110/62 | HR 66 | Ht 62.0 in | Wt 115.0 lb

## 2014-09-13 DIAGNOSIS — E785 Hyperlipidemia, unspecified: Secondary | ICD-10-CM

## 2014-09-13 DIAGNOSIS — I2510 Atherosclerotic heart disease of native coronary artery without angina pectoris: Secondary | ICD-10-CM | POA: Diagnosis not present

## 2014-09-13 DIAGNOSIS — I255 Ischemic cardiomyopathy: Secondary | ICD-10-CM

## 2014-09-13 DIAGNOSIS — I6523 Occlusion and stenosis of bilateral carotid arteries: Secondary | ICD-10-CM

## 2014-09-13 DIAGNOSIS — I5022 Chronic systolic (congestive) heart failure: Secondary | ICD-10-CM | POA: Diagnosis not present

## 2014-09-13 DIAGNOSIS — I10 Essential (primary) hypertension: Secondary | ICD-10-CM | POA: Diagnosis not present

## 2014-09-13 DIAGNOSIS — I519 Heart disease, unspecified: Secondary | ICD-10-CM

## 2014-09-13 NOTE — Progress Notes (Signed)
Patient ID: [name], female   DOB: [birth year], 81 y.o.   MRN: [id number]      SUBJECTIVE: The patient returns for follow-up of acute on chronic systolic congestive heart failure.  She is now on Lasix 40 mg daily and lisinopril 2.5 mg daily. Weight is 115 pounds (122 lbs on 5/17).  She is doing much better and denies chest pain, leg swelling, and syncope. She was able to walk from her house to her church and only had to stop twice.  In summary, she has a history of a large anterior wall myocardial infarction and several coronary artery stents, but eventually needed coronary artery bypass graft surgery which occurred in May 2012. She also has a history of right carotid endarterectomy and follows with vascular surgery. She has an ischemic cardiomyopathy. The most recent echocardiogram I find is dated 04/12/13 which showed an EF of 30-35% with mild LV dilatation, diastolic dysfunction (grade I?), moderate mitral regurgitation, and severely elevated pulmonary pressures (65-75 mmHg), interpreted by Dr. Johny Shears. She last saw Dr. Ladona Ridgel for AICD consideration in 2013.  ECG performed at last office visit in May demonstrated old anteroseptal and high lateral infarct, right bundle branch block, and left anterior fascicular block.  Soc: 2 of her children died last year, one daughter of cancer.  Review of Systems: As per "subjective", otherwise negative.  Allergies  Allergen Reactions  . Sulfonamide Derivatives Rash and Other (See Comments)    Severe Headache    Current Outpatient Prescriptions  Medication Sig Dispense Refill  . aspirin 325 MG tablet Take 325 mg by mouth daily.      Marland Kitchen atorvastatin (LIPITOR) 40 MG tablet Take 40 mg by mouth daily.     . carvedilol (COREG) 12.5 MG tablet Take 12.5 mg by mouth 2 (two) times daily.    . furosemide (LASIX) 40 MG tablet Take 20-40 mg by mouth daily.     Marland Kitchen lisinopril (PRINIVIL,ZESTRIL) 2.5 MG tablet Take 1 tablet (2.5 mg total) by mouth  daily. 30 tablet 6  . nitroGLYCERIN (NITROSTAT) 0.4 MG SL tablet Place 0.4 mg under the tongue every 5 (five) minutes as needed. For chest pain    . pantoprazole (PROTONIX) 40 MG tablet Take 40 mg by mouth daily.      . potassium chloride SA (K-DUR,KLOR-CON) 20 MEQ tablet Take 2 tabs ( ) by mouth x 3 days while doing the extra Lasix, then decrease to 1 tab ( ) daily 90 tablet 3   No current facility-administered medications for this visit.    Past Medical History  Diagnosis Date  . Unspecified essential hypertension   . Edema   . Coronary atherosclerosis of native coronary artery     echocardiogram June 2012 ejection fraction 40% with apical and anteroseptal akinesis with scarring.  . Hemothorax on left   . Myocardial infarction of anterior wall greater than eight weeks ago     large anterior wall myocardial infarction status post emergent PCI followed by coronary bypass grafting.,  Ejection fraction 40%  . Carotid artery disease     followed by vascular surgery  Carotid duplex in 02/2010-60-79% right proximal internal carotid artery stenosis; plaque without focal stenosis on the left; no change compared with 08/30/2009.  . Transient atrial fibrillation or flutter     post myocardial infarction and she remains in normal sinus rhythm  . Coronary artery disease     Acute anteroseptal MI treated with urgent PCI and followed by CABG surgery for left main and  severe three-vessel coronary disease in 07/2010; prior percutaneous intervention in 05/2007 with DES to the proximal LAD; RCA stent in 09/2009; both stents occluded as well as M1 prior to surgery  . CHF (congestive heart failure)   . Cataract     bilateral   . HOH (hard of hearing)   . GERD (gastroesophageal reflux disease)   . Shortness of breath     With ambulation  . Pneumonia   . Creatinine elevation   . Claustrophobia   . Arthritis     back    Past Surgical History  Procedure Laterality Date  . Tonsillectomy    .  Colonoscopy  2012  . Sternal wound exploration, evacuation of left hemothorax,  and sternal wou nd closure  08/13/10    Hendrickson  . Cabgx 3  08/09/10    [name]  . Cardiac catheterization  2001, 2008, 2009, 2012  . Pr vein bypass graft,aorto-fem-pop  07/29/10  . Coronary artery bypass graft  08/08/2010    Triple bypass  . Appendectomy    . Hand surgery      right hand  . Endarterectomy  12/13/2011    Procedure: ENDARTERECTOMY CAROTID;  Surgeon: Larina Earthly, MD;  Location: [facility] OR;  Service: Vascular;  Laterality: Right;  Right Carotid endartectomy with dacron patch angioplasty  . Carotid endarterectomy    . Eye surgery Bilateral 2014    cataract lens implant    History   Social History  . Marital Status: Widowed    Spouse Name: N/A  . Number of Children: N/A  . Years of Education: N/A   Occupational History  . Not on file.   Social History Main Topics  . Smoking status: Never Smoker   . Smokeless tobacco: Never Used  . Alcohol Use: No  . Drug Use: No  . Sexual Activity: Not on file   Other Topics Concern  . Not on file   Social History Narrative   Regular exercise- yes      Filed Vitals:   09/13/14 1037  BP: 110/62  Pulse: 66  Height: 5\' 2"  (1.575 m)  Weight: 115 lb (52.164 kg)  SpO2: 96%    PHYSICAL EXAM General: NAD HEENT: Normal. Neck: No JVD, no thyromegaly. Lungs: Clear b/l. CV: Regular rate and rhythm, normal S1/S2, no S3/S4, soft 1/6 systolic murmur along left sternal border. No pretibial nor periankle edema.  Abdomen: Soft, nontender, no distention.  Neurologic: Alert and oriented.  Psych: Normal affect. Skin: Normal. Musculoskeletal: No gross deformities. Extremities: No clubbing or cyanosis.   ECG: Most recent ECG reviewed.    ASSESSMENT AND PLAN: 1. CAD s/p CABG: Symptomatically stable. Continue ASA, Lipitor, lisinopril 2.5 mg, and Coreg. EF 30-35%.  2. Ischemic cardiomyopathy with chronic systolic heart failure: EF 30-35%. Euvolemic  and stable. Continue Lasix 40 mg daily with KCl. Due to have BMET checked by PCP tomorrow. Will obtain copy of results. On Coreg and lisinopril. Did not get AICD. May consider having her follow up with Dr. Ladona Ridgel, but echo will need to be repeated.  3. PVD and s/p right carotid endarterectomy: Last Duplex in 01/2014 showed patent right CEA with <40% RICA and LICA stenosis. Follows with vascular surgery.  4. Essential HTN: Now well controlled with addition of 2.5 mg lisinopril. No changes.  Dispo: f/u 3 months.   Prentice Docker, M.D., F.A.C.C.

## 2014-09-13 NOTE — Patient Instructions (Signed)
Your physician recommends that you schedule a follow-up appointment in: 3 months with Dr. Purvis Sheffield  Your physician recommends that you continue on your current medications as directed. Please refer to the Current Medication list given to you today.  WE WILL REQUEST LABS FROM YOUR PRIMARY PHYSICIAN   Thank you for choosing [facility]!!

## 2014-09-14 ENCOUNTER — Other Ambulatory Visit: Payer: Self-pay | Admitting: *Deleted

## 2014-09-14 DIAGNOSIS — I255 Ischemic cardiomyopathy: Secondary | ICD-10-CM

## 2014-09-14 DIAGNOSIS — R609 Edema, unspecified: Secondary | ICD-10-CM

## 2014-09-14 DIAGNOSIS — I10 Essential (primary) hypertension: Secondary | ICD-10-CM

## 2014-09-16 ENCOUNTER — Telehealth: Payer: Self-pay | Admitting: *Deleted

## 2014-09-16 MED ORDER — POTASSIUM CHLORIDE CRYS ER 20 MEQ PO TBCR: 10.0000 meq | EXTENDED_RELEASE_TABLET | Freq: Every day | ORAL | Status: DC

## 2014-09-16 NOTE — Telephone Encounter (Signed)
Notes Recorded by Lesle Chris, LPN on 03/26/5907 at 4:16 PM Patient notified via voicemail.

## 2014-09-16 NOTE — Telephone Encounter (Signed)
-----   Message from Laqueta Linden, MD sent at 09/16/2014 12:17 PM EDT ----- Would cut back KCl dose in half.

## 2014-10-14 ENCOUNTER — Telehealth: Payer: Self-pay | Admitting: *Deleted

## 2014-10-14 NOTE — Telephone Encounter (Signed)
Message left on voice mail for return call.  Attempted to return call - left message.

## 2014-10-17 NOTE — Telephone Encounter (Signed)
Just simply stay off of ACEI's and ARB's.

## 2014-10-17 NOTE — Telephone Encounter (Signed)
Patient came by office.  Stated that the Lisinopril was causing her to have a cough.  Did see her PMD on Friday & he suggested she remain off, but to contact our office since we prescribed initially.  Stated that she thought she had taken Cozaar in the past & that med caused cough as well.  Informed patient to remain off med till we get further advice from Dr. Purvis Sheffield.

## 2014-10-18 NOTE — Telephone Encounter (Signed)
Patient notified.  3 month follow up already scheduled for 11/17/2014 with Dr. Purvis Sheffield.

## 2014-11-17 ENCOUNTER — Encounter: Payer: Self-pay | Admitting: Cardiovascular Disease

## 2014-11-17 ENCOUNTER — Ambulatory Visit (INDEPENDENT_AMBULATORY_CARE_PROVIDER_SITE_OTHER): Payer: Medicare Other | Admitting: Cardiovascular Disease

## 2014-11-17 VITALS — BP 130/70 | HR 79 | Ht 62.0 in | Wt 116.8 lb

## 2014-11-17 DIAGNOSIS — E785 Hyperlipidemia, unspecified: Secondary | ICD-10-CM | POA: Diagnosis not present

## 2014-11-17 DIAGNOSIS — I2510 Atherosclerotic heart disease of native coronary artery without angina pectoris: Secondary | ICD-10-CM

## 2014-11-17 DIAGNOSIS — N183 Chronic kidney disease, stage 3 (moderate): Secondary | ICD-10-CM

## 2014-11-17 DIAGNOSIS — I5022 Chronic systolic (congestive) heart failure: Secondary | ICD-10-CM | POA: Diagnosis not present

## 2014-11-17 DIAGNOSIS — I10 Essential (primary) hypertension: Secondary | ICD-10-CM

## 2014-11-17 DIAGNOSIS — I255 Ischemic cardiomyopathy: Secondary | ICD-10-CM

## 2014-11-17 DIAGNOSIS — I519 Heart disease, unspecified: Secondary | ICD-10-CM

## 2014-11-17 NOTE — Progress Notes (Signed)
Patient ID: [name], female   DOB: 1929-07-25, 81 y.o.   MRN: [id number]      SUBJECTIVE: The patient returns for follow-up of chronic systolic congestive heart failure.  She is on Lasix 40 mg daily and Coreg. Developed cough with lisinopril and reports same with ARB in past. Weight is 116 pounds (115 lbs on 09/13/14).  She is doing well and denies chest pain, leg swelling, and syncope.   In summary, she has a history of a large anterior wall myocardial infarction and several coronary artery stents, but eventually needed coronary artery bypass graft surgery which occurred in May 2012. She also has a history of right carotid endarterectomy and follows with vascular surgery. She has an ischemic cardiomyopathy. The most recent echocardiogram I find is dated 04/12/13 which showed an EF of 30-35% with mild LV dilatation, diastolic dysfunction (grade I?), moderate mitral regurgitation, and severely elevated pulmonary pressures (65-75 mmHg), interpreted by Dr. Johny Shears. She last saw Dr. Ladona Ridgel for AICD consideration in 2013.  Creatinine 1.43 in June, K 5.4.  Soc: 2 of her children died last year, one daughter of cancer.   Review of Systems: As per "subjective", otherwise negative.  Allergies  Allergen Reactions  . Lisinopril Cough  . Sulfonamide Derivatives Rash and Other (See Comments)    Severe Headache    Current Outpatient Prescriptions  Medication Sig Dispense Refill  . aspirin 325 MG tablet Take 325 mg by mouth daily.      Marland Kitchen atorvastatin (LIPITOR) 40 MG tablet Take 40 mg by mouth daily.     . carvedilol (COREG) 12.5 MG tablet Take 12.5 mg by mouth 2 (two) times daily.    . furosemide (LASIX) 40 MG tablet Take 20-40 mg by mouth daily.     . nitroGLYCERIN (NITROSTAT) 0.4 MG SL tablet Place 0.4 mg under the tongue every 5 (five) minutes as needed. For chest pain    . pantoprazole (PROTONIX) 40 MG tablet Take 40 mg by mouth daily.      . potassium chloride SA  (K-DUR,KLOR-CON) 20 MEQ tablet Take 0.5 tablets (10 mEq total) by mouth daily. Dose decreased 09/16/2014.     No current facility-administered medications for this visit.    Past Medical History  Diagnosis Date  . Unspecified essential hypertension   . Edema   . Coronary atherosclerosis of native coronary artery     echocardiogram June 2012 ejection fraction 40% with apical and anteroseptal akinesis with scarring.  . Hemothorax on left   . Myocardial infarction of anterior wall greater than eight weeks ago     large anterior wall myocardial infarction status post emergent PCI followed by coronary bypass grafting.,  Ejection fraction 40%  . Carotid artery disease     followed by vascular surgery  Carotid duplex in 02/2010-60-79% right proximal internal carotid artery stenosis; plaque without focal stenosis on the left; no change compared with 08/30/2009.  . Transient atrial fibrillation or flutter     post myocardial infarction and she remains in normal sinus rhythm  . Coronary artery disease     Acute anteroseptal MI treated with urgent PCI and followed by CABG surgery for left main and severe three-vessel coronary disease in 07/2010; prior percutaneous intervention in 05/2007 with DES to the proximal LAD; RCA stent in 09/2009; both stents occluded as well as M1 prior to surgery  . CHF (congestive heart failure)   . Cataract     bilateral   . HOH (hard of hearing)   .  GERD (gastroesophageal reflux disease)   . Shortness of breath     With ambulation  . Pneumonia   . Creatinine elevation   . Claustrophobia   . Arthritis     back    Past Surgical History  Procedure Laterality Date  . Tonsillectomy    . Colonoscopy  2012  . Sternal wound exploration, evacuation of left hemothorax,  and sternal wou nd closure  08/13/10    Hendrickson  . Cabgx 3  08/09/10    [name]  . Cardiac catheterization  2001, 2008, 2009, 2012  . Pr vein bypass graft,aorto-fem-pop  07/29/10  . Coronary artery bypass  graft  08/08/2010    Triple bypass  . Appendectomy    . Hand surgery      right hand  . Endarterectomy  12/13/2011    Procedure: ENDARTERECTOMY CAROTID;  Surgeon: Larina Earthly, MD;  Location[facility] OR;  Service: Vascular;  Laterality: Right;  Right Carotid endartectomy with dacron patch angioplasty  . Carotid endarterectomy    . Eye surgery Bilateral 2014    cataract lens implant    Social History   Social History  . Marital Status: Widowed    Spouse Name: N/A  . Number of Children: N/A  . Years of Education: N/A   Occupational History  . Not on file.   Social History Main Topics  . Smoking status: Never Smoker   . Smokeless tobacco: Never Used  . Alcohol Use: No  . Drug Use: No  . Sexual Activity: Not on file   Other Topics Concern  . Not on file   Social History Narrative   Regular exercise- yes      Filed Vitals:   11/17/14 1315  BP: 130/70  Pulse: 79  Height: 5\' 2"  (1.575 m)  Weight: 116 lb 12.8 oz (52.98 kg)  SpO2: 95%    PHYSICAL EXAM General: NAD HEENT: Normal. Neck: No JVD, no thyromegaly. Lungs: Clear b/l. CV: Regular rate and rhythm, normal S1/S2, no S3/S4, soft 1/6 systolic murmur along left sternal border. No pretibial nor periankle edema.  Abdomen: Soft, nontender, no distention.  Neurologic: Alert and oriented.  Psych: Normal affect. Skin: Normal. Musculoskeletal: No gross deformities. Extremities: No clubbing or cyanosis.   ECG: Most recent ECG reviewed.      ASSESSMENT AND PLAN: 1. CAD s/p CABG: Symptomatically stable. Continue ASA (reduce to 81 mg), Lipitor, and Coreg. EF 30-35%.  Developed cough with lisinopril and reports same with ARB in past.  2. Ischemic cardiomyopathy with chronic systolic heart failure: EF 30-35%. Euvolemic and stable. Continue Lasix 40 mg daily with 10 meq KCl due to mild hyperkalemia in June. Will repeat BMET.  On Coreg.  Developed cough with lisinopril and reports same with ARB in past.Did not get AICD.  May consider having her follow up with Dr. Ladona Ridgel, but echo will need to be repeated.  3. PVD and s/p right carotid endarterectomy: Last Duplex in 01/2014 showed patent right CEA with <40% RICA and LICA stenosis. Follows with vascular surgery.  4. Essential HTN: Well controlled on Coreg. No changes.  5. CKD stage 3: Check BMET given Lasix and KCl.  Dispo: f/u 4 months.   Prentice Docker, M.D., F.A.C.C.

## 2014-11-17 NOTE — Patient Instructions (Addendum)
Your physician wants you to follow-up in:  4 months You will receive a reminder letter in the mail two months in advance. If you don't receive a letter, please call our office to schedule the follow-up appointment.    Take lasix 40 mg daily  Take potassium  10 meq daily   Lab work: BMET  DECREASE Aspirin to 81 mg daily   Thank you for choosing  [facility] !

## 2014-12-02 ENCOUNTER — Encounter: Payer: Self-pay | Admitting: *Deleted

## 2014-12-09 ENCOUNTER — Ambulatory Visit: Payer: Medicare Other | Admitting: Cardiovascular Disease

## 2015-01-13 ENCOUNTER — Emergency Department (HOSPITAL_COMMUNITY): Payer: Medicare Other

## 2015-01-13 ENCOUNTER — Inpatient Hospital Stay (HOSPITAL_COMMUNITY)
Admission: EM | Admit: 2015-01-13 | Discharge: 2015-01-17 | DRG: 536 | Disposition: A | Discharge status: Home Health | Payer: Medicare Other | Attending: Internal Medicine | Admitting: Internal Medicine

## 2015-01-13 ENCOUNTER — Encounter (HOSPITAL_COMMUNITY): Payer: Self-pay

## 2015-01-13 DIAGNOSIS — E559 Vitamin D deficiency, unspecified: Secondary | ICD-10-CM | POA: Diagnosis present

## 2015-01-13 DIAGNOSIS — Y92000 Kitchen of unspecified non-institutional (private) residence as  the place of occurrence of the external cause: Secondary | ICD-10-CM | POA: Diagnosis not present

## 2015-01-13 DIAGNOSIS — I480 Paroxysmal atrial fibrillation: Secondary | ICD-10-CM | POA: Diagnosis present

## 2015-01-13 DIAGNOSIS — S32511A Fracture of superior rim of right pubis, initial encounter for closed fracture: Principal | ICD-10-CM | POA: Diagnosis present

## 2015-01-13 DIAGNOSIS — I2510 Atherosclerotic heart disease of native coronary artery without angina pectoris: Secondary | ICD-10-CM | POA: Diagnosis present

## 2015-01-13 DIAGNOSIS — M160 Bilateral primary osteoarthritis of hip: Secondary | ICD-10-CM | POA: Diagnosis present

## 2015-01-13 DIAGNOSIS — Y92009 Unspecified place in unspecified non-institutional (private) residence as the place of occurrence of the external cause: Secondary | ICD-10-CM

## 2015-01-13 DIAGNOSIS — K219 Gastro-esophageal reflux disease without esophagitis: Secondary | ICD-10-CM | POA: Diagnosis present

## 2015-01-13 DIAGNOSIS — N183 Chronic kidney disease, stage 3 unspecified: Secondary | ICD-10-CM | POA: Diagnosis present

## 2015-01-13 DIAGNOSIS — S0003XA Contusion of scalp, initial encounter: Secondary | ICD-10-CM | POA: Diagnosis present

## 2015-01-13 DIAGNOSIS — W19XXXA Unspecified fall, initial encounter: Secondary | ICD-10-CM | POA: Diagnosis present

## 2015-01-13 DIAGNOSIS — R51 Headache: Secondary | ICD-10-CM | POA: Diagnosis present

## 2015-01-13 DIAGNOSIS — S329XXA Fracture of unspecified parts of lumbosacral spine and pelvis, initial encounter for closed fracture: Secondary | ICD-10-CM

## 2015-01-13 DIAGNOSIS — J209 Acute bronchitis, unspecified: Secondary | ICD-10-CM | POA: Diagnosis present

## 2015-01-13 DIAGNOSIS — D649 Anemia, unspecified: Secondary | ICD-10-CM | POA: Diagnosis present

## 2015-01-13 DIAGNOSIS — I5022 Chronic systolic (congestive) heart failure: Secondary | ICD-10-CM | POA: Diagnosis present

## 2015-01-13 DIAGNOSIS — Z8249 Family history of ischemic heart disease and other diseases of the circulatory system: Secondary | ICD-10-CM

## 2015-01-13 DIAGNOSIS — I252 Old myocardial infarction: Secondary | ICD-10-CM | POA: Diagnosis not present

## 2015-01-13 DIAGNOSIS — I255 Ischemic cardiomyopathy: Secondary | ICD-10-CM | POA: Diagnosis present

## 2015-01-13 DIAGNOSIS — N289 Disorder of kidney and ureter, unspecified: Secondary | ICD-10-CM

## 2015-01-13 DIAGNOSIS — E876 Hypokalemia: Secondary | ICD-10-CM | POA: Diagnosis present

## 2015-01-13 DIAGNOSIS — Z888 Allergy status to other drugs, medicaments and biological substances status: Secondary | ICD-10-CM

## 2015-01-13 DIAGNOSIS — I10 Essential (primary) hypertension: Secondary | ICD-10-CM | POA: Diagnosis present

## 2015-01-13 DIAGNOSIS — S0990XA Unspecified injury of head, initial encounter: Secondary | ICD-10-CM

## 2015-01-13 DIAGNOSIS — D631 Anemia in chronic kidney disease: Secondary | ICD-10-CM | POA: Diagnosis present

## 2015-01-13 DIAGNOSIS — H918X9 Other specified hearing loss, unspecified ear: Secondary | ICD-10-CM | POA: Diagnosis present

## 2015-01-13 DIAGNOSIS — Z951 Presence of aortocoronary bypass graft: Secondary | ICD-10-CM

## 2015-01-13 DIAGNOSIS — Z7982 Long term (current) use of aspirin: Secondary | ICD-10-CM

## 2015-01-13 DIAGNOSIS — S161XXA Strain of muscle, fascia and tendon at neck level, initial encounter: Secondary | ICD-10-CM

## 2015-01-13 DIAGNOSIS — Z8673 Personal history of transient ischemic attack (TIA), and cerebral infarction without residual deficits: Secondary | ICD-10-CM

## 2015-01-13 DIAGNOSIS — I129 Hypertensive chronic kidney disease with stage 1 through stage 4 chronic kidney disease, or unspecified chronic kidney disease: Secondary | ICD-10-CM | POA: Diagnosis present

## 2015-01-13 DIAGNOSIS — M810 Age-related osteoporosis without current pathological fracture: Secondary | ICD-10-CM | POA: Diagnosis present

## 2015-01-13 DIAGNOSIS — Z955 Presence of coronary angioplasty implant and graft: Secondary | ICD-10-CM

## 2015-01-13 DIAGNOSIS — Z79899 Other long term (current) drug therapy: Secondary | ICD-10-CM

## 2015-01-13 HISTORY — DX: Vitamin D deficiency, unspecified: E55.9

## 2015-01-13 HISTORY — DX: Unspecified place in unspecified non-institutional (private) residence as the place of occurrence of the external cause: Y92.009

## 2015-01-13 HISTORY — DX: Unspecified fall, initial encounter: W19.XXXA

## 2015-01-13 HISTORY — DX: Age-related osteoporosis without current pathological fracture: M81.0

## 2015-01-13 HISTORY — PX: FRACTURE SURGERY: SHX138

## 2015-01-13 NOTE — ED Provider Notes (Signed)
CSN: [id number]     Arrival date & time 01/13/15  2248 History  By signing my name below, I, Arianna Nassar, attest that this documentation has been prepared under the direction and in the presence of Geoffery Lyons, MD. Electronically Signed: Octavia Heir, ED Scribe. 01/13/2015. 11:28 PM.    Chief Complaint  Patient presents with  . Fall      The history is provided by the patient. No language interpreter was used.   HPI Comments: [name] is a 81 y.o. female who presents to the Emergency Department complaining of a fall that occurred this evening. Pt reports she slipped on something in her kitchen when she fell and hit the back of her head on the cabinet. She did not lose consciousness. She endorses pain to her right hip and right groin area. Pt says that she did not feel light-headed or dizzy when the fall occurred. Pt currently stays with her grandson and reports he was not at the house when she fell. She denies neck pain, abdominal pain, blood thinners and back pain.  Past Medical History  Diagnosis Date  . Unspecified essential hypertension   . Edema   . Coronary atherosclerosis of native coronary artery     echocardiogram June 2012 ejection fraction 40% with apical and anteroseptal akinesis with scarring.  . Hemothorax on left   . Myocardial infarction of anterior wall greater than eight weeks ago     large anterior wall myocardial infarction status post emergent PCI followed by coronary bypass grafting.,  Ejection fraction 40%  . Carotid artery disease (HCC)     followed by vascular surgery  Carotid duplex in 02/2010-60-79% right proximal internal carotid artery stenosis; plaque without focal stenosis on the left; no change compared with 08/30/2009.  . Transient atrial fibrillation or flutter     post myocardial infarction and she remains in normal sinus rhythm  . Coronary artery disease     Acute anteroseptal MI treated with urgent PCI and followed by CABG surgery for  left main and severe three-vessel coronary disease in 07/2010; prior percutaneous intervention in 05/2007 with DES to the proximal LAD; RCA stent in 09/2009; both stents occluded as well as M1 prior to surgery  . CHF (congestive heart failure) (HCC)   . Cataract     bilateral   . HOH (hard of hearing)   . GERD (gastroesophageal reflux disease)   . Shortness of breath     With ambulation  . Pneumonia   . Creatinine elevation   . Claustrophobia   . Arthritis     back   Past Surgical History  Procedure Laterality Date  . Tonsillectomy    . Colonoscopy  2012  . Sternal wound exploration, evacuation of left hemothorax,  and sternal wou nd closure  08/13/10    Hendrickson  . Cabgx 3  08/09/10    [name]  . Cardiac catheterization  2001, 2008, 2009, 2012  . Pr vein bypass graft,aorto-fem-pop  07/29/10  . Coronary artery bypass graft  08/08/2010    Triple bypass  . Appendectomy    . Hand surgery      right hand  . Endarterectomy  12/13/2011    Procedure: ENDARTERECTOMY CAROTID;  Surgeon: Larina Earthly, MD;  Location: [facility] OR;  Service: Vascular;  Laterality: Right;  Right Carotid endartectomy with dacron patch angioplasty  . Carotid endarterectomy    . Eye surgery Bilateral 2014    cataract lens implant   Family History  Problem  Relation Age of Onset  . Cancer Other   . Coronary artery disease Other   . Heart failure Father     chf  . Heart disease Father   . Hyperlipidemia Mother   . Hypertension Mother   . Aneurysm Mother     AAA  . Heart disease Mother   . Hyperlipidemia Sister   . Hypertension Sister   . Heart disease Sister     Heart Disease before age 79  . Heart attack Sister   . Hyperlipidemia Son   . Hypertension Son   . Heart disease Son   . Peripheral vascular disease Son     AMPUTATION  . Diabetes Son     Amputation Left foot   Social History  Substance Use Topics  . Smoking status: Never Smoker   . Smokeless tobacco: Never Used  . Alcohol Use: No   OB History     No data available     Review of Systems  Musculoskeletal: Positive for arthralgias. Negative for back pain and neck pain.  Neurological: Negative for dizziness and light-headedness.  All other systems reviewed and are negative.     Allergies  Lisinopril and Sulfonamide derivatives  Home Medications   Prior to Admission medications   Medication Sig Start Date End Date Taking? Authorizing Provider  aspirin EC 81 MG tablet Take 81 mg by mouth daily.    Historical Provider, MD  atorvastatin (LIPITOR) 40 MG tablet Take 40 mg by mouth daily.     Historical Provider, MD  carvedilol (COREG) 12.5 MG tablet Take 12.5 mg by mouth 2 (two) times daily. 09/28/10   Kathlen Brunswick, MD  furosemide (LASIX) 40 MG tablet Take 20-40 mg by mouth daily.     Historical Provider, MD  nitroGLYCERIN (NITROSTAT) 0.4 MG SL tablet Place 0.4 mg under the tongue every 5 (five) minutes as needed. For chest pain    Historical Provider, MD  pantoprazole (PROTONIX) 40 MG tablet Take 40 mg by mouth daily.      Historical Provider, MD  potassium chloride SA (K-DUR,KLOR-CON) 20 MEQ tablet Take 0.5 tablets (10 mEq total) by mouth daily. Dose decreased 09/16/2014. 09/16/14   Laqueta Linden, MD   BP 151/83 mmHg  Pulse 84  Temp(Src) 97.8 F (36.6 C) (Oral)  Ht 5\' 2"  (1.575 m)  Wt 116 lb (52.617 kg)  BMI 21.21 kg/m2  SpO2 93% Physical Exam  Constitutional: She is oriented to person, place, and time. She appears well-developed and well-nourished. No distress.  HENT:  Head: Normocephalic and atraumatic.  Eyes: EOM are normal.  Neck: Normal range of motion.  There is no tenderness in the cervical region. Painless ROM in all directions.  Cardiovascular: Normal rate, regular rhythm and normal heart sounds.   Pulmonary/Chest: Effort normal and breath sounds normal.  Abdominal: Soft. She exhibits no distension. There is no tenderness.  Musculoskeletal: Normal range of motion.  There is TTP over the lateral aspect of  the right hip. There is no shortening or rotation. She does have pain with external rotation in movement. Distal PM is intact  Neurological: She is alert and oriented to person, place, and time.  Skin: Skin is warm and dry.  Psychiatric: She has a normal mood and affect. Judgment normal.  Nursing note and vitals reviewed.   ED Course  Procedures  DIAGNOSTIC STUDIES: Oxygen Saturation is 93% on RA, low by my interpretation.  COORDINATION OF CARE:  11:24 PM Discussed treatment plan which includes x-ray  right hip and CT of head/neck with pt at bedside and pt agreed to plan.  Labs Review Labs Reviewed - No data to display  Imaging Review No results found. I have personally reviewed and evaluated these images and lab results as part of my medical decision-making.   EKG Interpretation None      MDM   Final diagnoses:  None    X-rays reveal pelvic fractures. As the patient is unable to ambulate, I feel as though she will require admission for pain control and physical therapy. I've spoken to the hospitalist who agrees to admit.  I personally performed the services described in this documentation, which was scribed in my presence. The recorded information has been reviewed and is accurate.       Geoffery Lyons, MD 01/25/15 [phone]

## 2015-01-13 NOTE — ED Notes (Signed)
Per [facility], pt from home. Lives with grandson. Slipped on something and fell in the kitchen hitting her head, denies any LOC. Hit her head on the cabinet but denies any neck or back pain. Does report some pain to her right upper thigh with movement. No shortening or rotation.

## 2015-01-14 ENCOUNTER — Encounter (HOSPITAL_COMMUNITY): Payer: Self-pay | Admitting: Family Medicine

## 2015-01-14 DIAGNOSIS — I2510 Atherosclerotic heart disease of native coronary artery without angina pectoris: Secondary | ICD-10-CM | POA: Diagnosis not present

## 2015-01-14 DIAGNOSIS — E559 Vitamin D deficiency, unspecified: Secondary | ICD-10-CM | POA: Diagnosis present

## 2015-01-14 DIAGNOSIS — D631 Anemia in chronic kidney disease: Secondary | ICD-10-CM | POA: Diagnosis present

## 2015-01-14 DIAGNOSIS — D649 Anemia, unspecified: Secondary | ICD-10-CM | POA: Diagnosis not present

## 2015-01-14 DIAGNOSIS — H918X9 Other specified hearing loss, unspecified ear: Secondary | ICD-10-CM | POA: Diagnosis present

## 2015-01-14 DIAGNOSIS — N183 Chronic kidney disease, stage 3 unspecified: Secondary | ICD-10-CM | POA: Diagnosis present

## 2015-01-14 DIAGNOSIS — Y92000 Kitchen of unspecified non-institutional (private) residence as  the place of occurrence of the external cause: Secondary | ICD-10-CM | POA: Diagnosis not present

## 2015-01-14 DIAGNOSIS — I129 Hypertensive chronic kidney disease with stage 1 through stage 4 chronic kidney disease, or unspecified chronic kidney disease: Secondary | ICD-10-CM | POA: Diagnosis present

## 2015-01-14 DIAGNOSIS — M160 Bilateral primary osteoarthritis of hip: Secondary | ICD-10-CM | POA: Diagnosis present

## 2015-01-14 DIAGNOSIS — W19XXXA Unspecified fall, initial encounter: Secondary | ICD-10-CM | POA: Diagnosis present

## 2015-01-14 DIAGNOSIS — S32511A Fracture of superior rim of right pubis, initial encounter for closed fracture: Secondary | ICD-10-CM | POA: Diagnosis present

## 2015-01-14 DIAGNOSIS — S0003XA Contusion of scalp, initial encounter: Secondary | ICD-10-CM | POA: Diagnosis present

## 2015-01-14 DIAGNOSIS — I480 Paroxysmal atrial fibrillation: Secondary | ICD-10-CM | POA: Diagnosis present

## 2015-01-14 DIAGNOSIS — J209 Acute bronchitis, unspecified: Secondary | ICD-10-CM | POA: Diagnosis present

## 2015-01-14 DIAGNOSIS — I10 Essential (primary) hypertension: Secondary | ICD-10-CM

## 2015-01-14 DIAGNOSIS — S32601A Unspecified fracture of right ischium, initial encounter for closed fracture: Secondary | ICD-10-CM

## 2015-01-14 DIAGNOSIS — S329XXA Fracture of unspecified parts of lumbosacral spine and pelvis, initial encounter for closed fracture: Secondary | ICD-10-CM | POA: Diagnosis present

## 2015-01-14 DIAGNOSIS — I5022 Chronic systolic (congestive) heart failure: Secondary | ICD-10-CM | POA: Diagnosis not present

## 2015-01-14 DIAGNOSIS — I252 Old myocardial infarction: Secondary | ICD-10-CM | POA: Diagnosis not present

## 2015-01-14 DIAGNOSIS — K219 Gastro-esophageal reflux disease without esophagitis: Secondary | ICD-10-CM | POA: Diagnosis present

## 2015-01-14 DIAGNOSIS — E876 Hypokalemia: Secondary | ICD-10-CM | POA: Diagnosis present

## 2015-01-14 DIAGNOSIS — I255 Ischemic cardiomyopathy: Secondary | ICD-10-CM | POA: Diagnosis present

## 2015-01-14 LAB — MAGNESIUM: MAGNESIUM: 0.7 mg/dL — AB (ref 1.7–2.4)

## 2015-01-14 LAB — BASIC METABOLIC PANEL
ANION GAP: 15 (ref 5–15)
BUN: 20 mg/dL (ref 6–20)
CO2: 23 mmol/L (ref 22–32)
Calcium: 7.2 mg/dL — ABNORMAL LOW (ref 8.9–10.3)
Chloride: 102 mmol/L (ref 101–111)
Creatinine, Ser: 1.23 mg/dL — ABNORMAL HIGH (ref 0.44–1.00)
GFR, EST AFRICAN AMERICAN: 46 mL/min — AB (ref >60)
GFR, EST NON AFRICAN AMERICAN: 39 mL/min — AB (ref >60)
GLUCOSE: 177 mg/dL — AB (ref 65–99)
POTASSIUM: 3.1 mmol/L — AB (ref 3.5–5.1)
Sodium: 140 mmol/L (ref 135–145)

## 2015-01-14 LAB — CBC WITH DIFFERENTIAL/PLATELET
BASOS ABS: 0 10*3/uL (ref 0.0–0.1)
BASOS PCT: 0 %
Eosinophils Absolute: 0.2 10*3/uL (ref 0.0–0.7)
Eosinophils Relative: 2 %
HEMATOCRIT: 28.8 % — AB (ref 36.0–46.0)
HEMOGLOBIN: 9.5 g/dL — AB (ref 12.0–15.0)
LYMPHS PCT: 14 %
Lymphs Abs: 1.1 10*3/uL (ref 0.7–4.0)
MCH: 29.7 pg (ref 26.0–34.0)
MCHC: 33 g/dL (ref 30.0–36.0)
MCV: 90 fL (ref 78.0–100.0)
MONO ABS: 0.5 10*3/uL (ref 0.1–1.0)
Monocytes Relative: 7 %
NEUTROS ABS: 6.1 10*3/uL (ref 1.7–7.7)
NEUTROS PCT: 77 %
Platelets: 201 10*3/uL (ref 150–400)
RBC: 3.2 MIL/uL — ABNORMAL LOW (ref 3.87–5.11)
RDW: 12.7 % (ref 11.5–15.5)
WBC: 7.9 10*3/uL (ref 4.0–10.5)

## 2015-01-14 LAB — PHOSPHORUS: Phosphorus: 3.2 mg/dL (ref 2.5–4.6)

## 2015-01-14 LAB — PROTIME-INR
INR: 1.3 (ref 0.00–1.49)
Prothrombin Time: 16.4 seconds — ABNORMAL HIGH (ref 11.6–15.2)

## 2015-01-14 LAB — TSH: TSH: 3.558 u[IU]/mL (ref 0.350–4.500)

## 2015-01-14 MED ORDER — ATORVASTATIN CALCIUM 40 MG PO TABS
40.0000 mg | ORAL_TABLET | Freq: Every day | ORAL | Status: DC
  Administered 2015-01-14 – 2015-01-17 (×4): 40 mg via ORAL
  Filled 2015-01-14 (×4): qty 1

## 2015-01-14 MED ORDER — ENOXAPARIN SODIUM 40 MG/0.4ML ~~LOC~~ SOLN
40.0000 mg | SUBCUTANEOUS | Status: DC
  Administered 2015-01-14: 40 mg via SUBCUTANEOUS
  Filled 2015-01-14: qty 0.4

## 2015-01-14 MED ORDER — HYDROCODONE-ACETAMINOPHEN 5-325 MG PO TABS: 1.0000 | ORAL_TABLET | ORAL | Status: DC | PRN

## 2015-01-14 MED ORDER — PANTOPRAZOLE SODIUM 40 MG PO TBEC
40.0000 mg | DELAYED_RELEASE_TABLET | Freq: Every day | ORAL | Status: DC
  Administered 2015-01-14 – 2015-01-17 (×4): 40 mg via ORAL
  Filled 2015-01-14 (×4): qty 1

## 2015-01-14 MED ORDER — CARVEDILOL 12.5 MG PO TABS
12.5000 mg | ORAL_TABLET | Freq: Two times a day (BID) | ORAL | Status: DC
  Administered 2015-01-14 – 2015-01-17 (×7): 12.5 mg via ORAL
  Filled 2015-01-14 (×7): qty 1

## 2015-01-14 MED ORDER — ACETAMINOPHEN 500 MG PO TABS
1000.0000 mg | ORAL_TABLET | Freq: Three times a day (TID) | ORAL | Status: DC
  Administered 2015-01-14 – 2015-01-17 (×8): 1000 mg via ORAL
  Filled 2015-01-14 (×9): qty 2

## 2015-01-14 MED ORDER — GUAIFENESIN ER 600 MG PO TB12
600.0000 mg | ORAL_TABLET | Freq: Two times a day (BID) | ORAL | Status: DC
  Administered 2015-01-14 – 2015-01-17 (×7): 600 mg via ORAL
  Filled 2015-01-14 (×7): qty 1

## 2015-01-14 MED ORDER — POTASSIUM CHLORIDE CRYS ER 20 MEQ PO TBCR
20.0000 meq | EXTENDED_RELEASE_TABLET | Freq: Every day | ORAL | Status: DC
  Administered 2015-01-15 – 2015-01-17 (×3): 20 meq via ORAL
  Filled 2015-01-14 (×3): qty 1

## 2015-01-14 MED ORDER — POTASSIUM CHLORIDE CRYS ER 20 MEQ PO TBCR
40.0000 meq | EXTENDED_RELEASE_TABLET | Freq: Once | ORAL | Status: AC
  Administered 2015-01-14: 40 meq via ORAL
  Filled 2015-01-14: qty 2

## 2015-01-14 MED ORDER — ASPIRIN EC 81 MG PO TBEC
81.0000 mg | DELAYED_RELEASE_TABLET | Freq: Every day | ORAL | Status: DC
  Administered 2015-01-14 – 2015-01-17 (×4): 81 mg via ORAL
  Filled 2015-01-14 (×5): qty 1

## 2015-01-14 MED ORDER — MAGNESIUM SULFATE 2 GM/50ML IV SOLN
2.0000 g | Freq: Once | INTRAVENOUS | Status: AC
  Administered 2015-01-14: 2 g via INTRAVENOUS
  Filled 2015-01-14: qty 50

## 2015-01-14 MED ORDER — FUROSEMIDE 20 MG PO TABS
20.0000 mg | ORAL_TABLET | Freq: Every day | ORAL | Status: DC
  Administered 2015-01-14 – 2015-01-17 (×4): 20 mg via ORAL
  Filled 2015-01-14 (×4): qty 1

## 2015-01-14 MED ORDER — ENOXAPARIN SODIUM 30 MG/0.3ML ~~LOC~~ SOLN
30.0000 mg | SUBCUTANEOUS | Status: DC
  Administered 2015-01-15 – 2015-01-17 (×3): 30 mg via SUBCUTANEOUS
  Filled 2015-01-14 (×3): qty 0.3

## 2015-01-14 MED ORDER — POLYETHYLENE GLYCOL 3350 17 G PO PACK
17.0000 g | PACK | Freq: Every day | ORAL | Status: DC
  Administered 2015-01-14 – 2015-01-16 (×3): 17 g via ORAL
  Filled 2015-01-14 (×4): qty 1

## 2015-01-14 MED ORDER — MORPHINE SULFATE (PF) 2 MG/ML IV SOLN
2.0000 mg | Freq: Once | INTRAVENOUS | Status: AC
  Administered 2015-01-14: 2 mg via INTRAVENOUS
  Filled 2015-01-14: qty 1

## 2015-01-14 MED ORDER — POTASSIUM CHLORIDE CRYS ER 20 MEQ PO TBCR
10.0000 meq | EXTENDED_RELEASE_TABLET | Freq: Every day | ORAL | Status: DC
  Administered 2015-01-14: 10 meq via ORAL
  Filled 2015-01-14: qty 1

## 2015-01-14 MED ORDER — ALBUTEROL SULFATE (2.5 MG/3ML) 0.083% IN NEBU: 2.5000 mg | INHALATION_SOLUTION | RESPIRATORY_TRACT | Status: DC | PRN

## 2015-01-14 MED ORDER — GUAIFENESIN-CODEINE 100-10 MG/5ML PO SOLN: 5.0000 mL | Freq: Four times a day (QID) | ORAL | Status: DC | PRN

## 2015-01-14 MED ORDER — AZITHROMYCIN 250 MG PO TABS: 250.0000 mg | ORAL_TABLET | ORAL | Status: DC

## 2015-01-14 MED ORDER — TRAMADOL HCL 50 MG PO TABS
50.0000 mg | ORAL_TABLET | Freq: Four times a day (QID) | ORAL | Status: DC | PRN
  Administered 2015-01-14: 50 mg via ORAL
  Filled 2015-01-14: qty 1

## 2015-01-14 MED ORDER — MORPHINE SULFATE (PF) 2 MG/ML IV SOLN
1.0000 mg | INTRAVENOUS | Status: DC | PRN
  Administered 2015-01-14: 1 mg via INTRAVENOUS
  Filled 2015-01-14: qty 1

## 2015-01-14 MED ORDER — IPRATROPIUM-ALBUTEROL 0.5-2.5 (3) MG/3ML IN SOLN
3.0000 mL | Freq: Three times a day (TID) | RESPIRATORY_TRACT | Status: DC
  Administered 2015-01-14 – 2015-01-16 (×6): 3 mL via RESPIRATORY_TRACT
  Filled 2015-01-14 (×6): qty 3

## 2015-01-14 NOTE — Consult Note (Signed)
[facility] (OTS)  Reason for Consult: Pelvic fracture  Referring Physician: Nena Alexander, MD (hospitalist)   HPI: [name] is an 81 y.o. female who presents after falling in her kitchen around 8:30pm last night. Patient states she was alone in her kitchen when she's not sure how she lost her balance, but ended up falling on the tile floor hitting her head and hip in the process. She was able to crawl to the phone to call her grandson and friend, who arrived and called 911. Upon presentation, patient denies any pain except when she moves her right hip in certain directions. Denies chest pain, worsened shortness of breath, nausea, vomiting or lightheadedness. States that many years ago she was diagnosed with osteoporosis and undergoes a bone scan every 2 years for monitoring. Had one trial of IV medication years ago for her osteoporosis but was unable to tolerate treatment due to GI side effects. Patient currently denies taking any medication, vitamin D or calcium for her bone health. Denies that she falls regularly, but states that she does feel unsteady when walking long distances.  Pt reports some previous intermittent episodes of diarrhea which have since resolved, pt took some imodium, has not had a BM in about 4-5 days   Pt also states that she has not really felt all that steady since her heart surgery 4 years ago   She does not use a walker or Cane She lives with her grandson  Does not go out very much. Hangs on shopping carts if she goes to the store, does not use WC or motorized WC   PCP: Dr. Manuella Ghazi [facility] internal medicine)   Past Medical History  Diagnosis Date  . Unspecified essential hypertension   . Edema   . Coronary atherosclerosis of native coronary artery     echocardiogram June 2012 ejection fraction 40% with apical and anteroseptal akinesis with scarring.  . Hemothorax on left   . Myocardial infarction of anterior wall greater than eight weeks ago      large anterior wall myocardial infarction status post emergent PCI followed by coronary bypass grafting.,  Ejection fraction 40%  . Carotid artery disease ([place])     followed by vascular surgery  Carotid duplex in 02/2010-60-79% right proximal internal carotid artery stenosis; plaque without focal stenosis on the left; no change compared with 08/30/2009.  . Transient atrial fibrillation or flutter     post myocardial infarction and she remains in normal sinus rhythm  . Coronary artery disease     Acute anteroseptal MI treated with urgent PCI and followed by CABG surgery for left main and severe three-vessel coronary disease in 07/2010; prior percutaneous intervention in 05/2007 with DES to the proximal LAD; RCA stent in 09/2009; both stents occluded as well as M1 prior to surgery  . CHF (congestive heart failure) ([place])   . Cataract     bilateral   . HOH (hard of hearing)   . GERD (gastroesophageal reflux disease)   . Shortness of breath     With ambulation  . Pneumonia   . Creatinine elevation   . Claustrophobia   . Arthritis     back    Past Surgical History  Procedure Laterality Date  . Tonsillectomy    . Colonoscopy  2012  . Sternal wound exploration, evacuation of left hemothorax,  and sternal wou nd closure  08/13/10    [place]  . Cabgx 3  08/09/10    Roxy Manns  . Cardiac catheterization  2001, 2008, 2009, 2012  . Pr vein bypass graft,aorto-fem-pop  07/29/10  . Coronary artery bypass graft  08/08/2010    Triple bypass  . Appendectomy    . Hand surgery      right hand  . Endarterectomy  12/13/2011    Procedure: ENDARTERECTOMY CAROTID;  Surgeon: Rosetta Posner, MD;  Location: [facility] OR;  Service: Vascular;  Laterality: Right;  Right Carotid endartectomy with dacron patch angioplasty  . Carotid endarterectomy    . Eye surgery Bilateral 2014    cataract lens implant    Family History  Problem Relation Age of Onset  . Cancer Other   . Coronary artery disease Other   . Heart failure  Father     chf  . Heart disease Father   . Hyperlipidemia Mother   . Hypertension Mother   . Aneurysm Mother     AAA  . Heart disease Mother   . Hyperlipidemia Sister   . Hypertension Sister   . Heart disease Sister     Heart Disease before age 19  . Heart attack Sister   . Hyperlipidemia Son   . Hypertension Son   . Heart disease Son   . Peripheral vascular disease Son     AMPUTATION  . Diabetes Son     Amputation Left foot    Social History:  reports that she has never smoked. She has never used smokeless tobacco. She reports that she does not drink alcohol or use illicit drugs.   Patient lives at home with her grandson.    Allergies:  Allergies  Allergen Reactions  . Lisinopril Cough  . Sulfonamide Derivatives Rash and Other (See Comments)    Severe Headache    Medications:  I have reviewed the patient's current medications.  Prior to Admission:  Prescriptions prior to admission  Medication Sig Dispense Refill Last Dose  . aspirin EC 81 MG tablet Take 81 mg by mouth daily.   01/13/2015 at Unknown time  . atorvastatin (LIPITOR) 40 MG tablet Take 40 mg by mouth daily.    01/13/2015 at Unknown time  . azithromycin (ZITHROMAX) 250 MG tablet Take 250-500 mg by mouth See admin instructions. Take 2 tablets on day 1 and 1 tablet for the next 4 days.   01/13/2015 at Unknown time  . benzonatate (TESSALON) 100 MG capsule Take 100 mg by mouth 3 (three) times daily as needed for cough.   01/13/2015 at Unknown time  . carvedilol (COREG) 12.5 MG tablet Take 12.5 mg by mouth 2 (two) times daily.   01/13/2015 at 200  . furosemide (LASIX) 40 MG tablet Take 20-40 mg by mouth daily.    Past Week at Unknown time  . nitroGLYCERIN (NITROSTAT) 0.4 MG SL tablet Place 0.4 mg under the tongue every 5 (five) minutes as needed. For chest pain   Past Month at Unknown time  . pantoprazole (PROTONIX) 40 MG tablet Take 40 mg by mouth daily.     01/13/2015 at Unknown time  . potassium chloride SA  (K-DUR,KLOR-CON) 20 MEQ tablet Take 0.5 tablets (10 mEq total) by mouth daily. Dose decreased 09/16/2014.   Past Week at Unknown time   Scheduled: . acetaminophen  1,000 mg Oral 3 times per day  . aspirin EC  81 mg Oral Daily  . atorvastatin  40 mg Oral Daily  . azithromycin  250 mg Oral See admin instructions  . carvedilol  12.5 mg Oral BID  . enoxaparin (LOVENOX) injection  40 mg Subcutaneous  Q24H  . furosemide  20 mg Oral Daily  . guaiFENesin  600 mg Oral BID  . ipratropium-albuterol  3 mL Nebulization Q8H  . pantoprazole  40 mg Oral Daily  . polyethylene glycol  17 g Oral Daily  . potassium chloride SA  10 mEq Oral Daily    Results for orders placed or performed during the hospital encounter of 01/13/15 (from the past 48 hour(s))  Basic metabolic panel     Status: Abnormal   Collection Time: 01/14/15 12:26 AM  Result Value Ref Range   Sodium 140 135 - 145 mmol/L   Potassium 3.1 (L) 3.5 - 5.1 mmol/L   Chloride 102 101 - 111 mmol/L   CO2 23 22 - 32 mmol/L   Glucose, Bld 177 (H) 65 - 99 mg/dL   BUN 20 6 - 20 mg/dL   Creatinine, Ser 1.23 (H) 0.44 - 1.00 mg/dL   Calcium 7.2 (L) 8.9 - 10.3 mg/dL   GFR calc non Af Amer 39 (L) >60 mL/min   GFR calc Af Amer 46 (L) >60 mL/min    Comment: (NOTE) The eGFR has been calculated using the CKD EPI equation. This calculation has not been validated in all clinical situations. eGFR's persistently <60 mL/min signify possible Chronic Kidney Disease.    Anion gap 15 5 - 15  CBC with Differential     Status: Abnormal   Collection Time: 01/14/15 12:26 AM  Result Value Ref Range   WBC 7.9 4.0 - 10.5 K/uL   RBC 3.20 (L) 3.87 - 5.11 MIL/uL   Hemoglobin 9.5 (L) 12.0 - 15.0 g/dL   HCT 28.8 (L) 36.0 - 46.0 %   MCV 90.0 78.0 - 100.0 fL   MCH 29.7 26.0 - 34.0 pg   MCHC 33.0 30.0 - 36.0 g/dL   RDW 12.7 11.5 - 15.5 %   Platelets 201 150 - 400 K/uL   Neutrophils Relative % 77 %   Neutro Abs 6.1 1.7 - 7.7 K/uL   Lymphocytes Relative 14 %   Lymphs  Abs 1.1 0.7 - 4.0 K/uL   Monocytes Relative 7 %   Monocytes Absolute 0.5 0.1 - 1.0 K/uL   Eosinophils Relative 2 %   Eosinophils Absolute 0.2 0.0 - 0.7 K/uL   Basophils Relative 0 %   Basophils Absolute 0.0 0.0 - 0.1 K/uL  Protime-INR     Status: Abnormal   Collection Time: 01/14/15 12:26 AM  Result Value Ref Range   Prothrombin Time 16.4 (H) 11.6 - 15.2 seconds   INR 1.30 0.00 - 1.49    Dg Chest 1 View  01/14/2015  CLINICAL DATA:  Pain following fall EXAM: CHEST 1 VIEW COMPARISON:  Aug 02, 2014 FINDINGS: The interstitium remains rather prominent with questionable mild generalized interstitial edema. No airspace consolidation. There is cardiomegaly with pulmonary vascularity within normal limits. Patient is status post coronary artery bypass grafting. No adenopathy. Bones are osteoporotic. No pneumothorax. No acute fracture seen. IMPRESSION: Cardiomegaly. Suspect mild interstitial edema with a degree of chronic congestive heart failure. No airspace consolidation. Electronically Signed   By: Lowella Grip III M.D.   On: 01/14/2015 00:11   Ct Head Wo Contrast  01/14/2015  CLINICAL DATA:  [place] and hit head and neck on floor. Severe neck, base of skull pain. History of hypertension. EXAM: CT HEAD WITHOUT CONTRAST CT CERVICAL SPINE WITHOUT CONTRAST TECHNIQUE: Multidetector CT imaging of the head and cervical spine was performed following the standard protocol without intravenous contrast. Multiplanar CT image  reconstructions of the cervical spine were also generated. COMPARISON:  CT CT head and angiogram of the neck November 19, 2011 FINDINGS: CT HEAD FINDINGS The ventricles and sulci are normal for age. No intraparenchymal hemorrhage, mass effect nor midline shift. Patchy supratentorial white matter hypodensities are within normal range for patient's age and though non-specific suggest sequelae of chronic small vessel ischemic disease. No acute large vascular territory infarcts. Old RIGHT caudate  head lacunar infarct. No abnormal extra-axial fluid collections. Basal cisterns are patent. Severe calcific atherosclerosis of the carotid siphons. No skull fracture. Small LEFT parietal scalp hematoma without subcutaneous gas or radiopaque foreign bodies. The included ocular globes and orbital contents are non-suspicious. Status post bilateral ocular lens implants. Atretic, chronic RIGHT maxillary sinusitis with small calcified mucosal retention cysts. The mastoid air cells are well aerated. Patient is edentulous. Severe temporomandibular osteoarthrosis. CT CERVICAL SPINE FINDINGS Cervical vertebral bodies intact. Grade 1 C4-5 anterolisthesis without spondylolysis, the C4-5 facets are fused on degenerative basis. Moderate to severe C5-6 disc height loss, uncovertebral hypertrophy and endplate spurring consistent with degenerative disc. C1-2 articulation maintained with moderate to severe arthropathy. Small amount of calcified pannus about the odontoid process. No destructive bony lesions. Partially imaged median sternotomy. Severe calcific atherosclerosis of the LEFT carotid bulb, similar to prior CT. Heterogeneous dominant 2.6 cm thyroid nodule, stable from 2013. Moderate to severe C5-6 neural foraminal narrowing. IMPRESSION: CT HEAD: Small LEFT parietal scalp hematoma.  No skull fracture. No acute intracranial process. 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 CT CERVICAL SPINE: No acute cervical spine fracture. Grade 1 C4-5 anterolisthesis on degenerative basis. Electronically Signed   By: Elon Alas M.D.   On: 01/14/2015 00:32   Ct Cervical Spine Wo Contrast  01/14/2015  CLINICAL DATA:  [place] and hit head and neck on floor. Severe neck, base of skull pain. History of hypertension. EXAM: CT HEAD WITHOUT CONTRAST CT CERVICAL SPINE WITHOUT CONTRAST TECHNIQUE: Multidetector CT imaging of the head and cervical spine was performed following the  standard protocol without intravenous contrast. Multiplanar CT image reconstructions of the cervical spine were also generated. COMPARISON:  CT CT head and angiogram of the neck November 19, 2011 FINDINGS: CT HEAD FINDINGS The ventricles and sulci are normal for age. No intraparenchymal hemorrhage, mass effect nor midline shift. Patchy supratentorial white matter hypodensities are within normal range for patient's age and though non-specific suggest sequelae of chronic small vessel ischemic disease. No acute large vascular territory infarcts. Old RIGHT caudate head lacunar infarct. No abnormal extra-axial fluid collections. Basal cisterns are patent. Severe calcific atherosclerosis of the carotid siphons. No skull fracture. Small LEFT parietal scalp hematoma without subcutaneous gas or radiopaque foreign bodies. The included ocular globes and orbital contents are non-suspicious. Status post bilateral ocular lens implants. Atretic, chronic RIGHT maxillary sinusitis with small calcified mucosal retention cysts. The mastoid air cells are well aerated. Patient is edentulous. Severe temporomandibular osteoarthrosis. CT CERVICAL SPINE FINDINGS Cervical vertebral bodies intact. Grade 1 C4-5 anterolisthesis without spondylolysis, the C4-5 facets are fused on degenerative basis. Moderate to severe C5-6 disc height loss, uncovertebral hypertrophy and endplate spurring consistent with degenerative disc. C1-2 articulation maintained with moderate to severe arthropathy. Small amount of calcified pannus about the odontoid process. No destructive bony lesions. Partially imaged median sternotomy. Severe calcific atherosclerosis of the LEFT carotid bulb, similar to prior CT. Heterogeneous dominant 2.6 cm thyroid nodule, stable from 2013. Moderate to severe C5-6 neural foraminal narrowing. IMPRESSION: CT  HEAD: Small LEFT parietal scalp hematoma.  No skull fracture. No acute intracranial process. Stable appearance the head from  November 19, 2011 including Old RIGHT caudate head lacunar infarct. CT CERVICAL SPINE: No acute cervical spine fracture. Grade 1 C4-5 anterolisthesis on degenerative basis. Electronically Signed   By: Elon Alas M.D.   On: 01/14/2015 00:32   Dg Hip Unilat With Pelvis 2-3 Views Right  01/14/2015  CLINICAL DATA:  Pain following fall EXAM: DG HIP (WITH OR WITHOUT PELVIS) 2-3V RIGHT COMPARISON:  None. FINDINGS: Frontal pelvis as well as frontal and lateral right hip images were obtained. There is a comminuted fracture of the medial right superior pubic ramus as well as a comminuted fracture of the medial right ischium. There is a nondisplaced fracture of the lateral right superior pubic ramus as well. No other fractures. No dislocation. There is moderate symmetric narrowing of both hip joints. Bones are somewhat osteoporotic. IMPRESSION: Comminuted fractures of the medial right superior pubic ramus and ischium. Nondisplaced fracture lateral superior pubic ramus on the right. Moderate osteoarthritic change in both hip joints. No dislocations. Bones osteoporotic. Electronically Signed   By: Lowella Grip III M.D.   On: 01/14/2015 00:09    Review of Systems  Constitutional: Negative for fever and chills.  Respiratory: Positive for cough and shortness of breath (Stable).   Cardiovascular: Negative for chest pain.  Gastrointestinal: Negative for nausea and vomiting.  Musculoskeletal: Positive for myalgias (Leg cramps) and joint pain (Right hip pain movement).  Neurological: Negative for dizziness and headaches.   Blood pressure 142/72, pulse 88, temperature 99.1 F (37.3 C), temperature source Oral, resp. rate 18, height '5\' 2"'  (1.575 m), weight 52.6 kg (115 lb 15.4 oz), SpO2 98 %. Physical Exam General: Elderly woman laying in bed, appears comfortable Heart: RRR with S1 and S2 Lungs: Clear anterior fields GI: Soft, NTND, +BS  Ext      Right Lower Extremity  Soft tissue hip and pelvis  unremarkable  No ecchymosis or open wounds  Soft tissue around suprapubic region unremarkable as well   Pain to palpation of R hip and pelvis, quite apprehensive with attempted manipulation of pelvis but no apparent gross instability  Pain with SLR  No significant pain with axial loading or log rolling or R leg  Pt can perform active knee extension and flexion w/o significant discomfort as well  Ext warm  + DP pulse  DPN, SPN, TN sensation grossly intact  Full ROM in knee and ankle  EHL, FHL, AT, PT, peroneals, gastroc motor functions grossly intact      Left Lower Extremity  Full ROM of hip, knee, and ankle  Ext warm  Motor and sensory functions grossly intact     Assessment/Plan:  Bennie Scaff is an 81 y/o women who fell last night at home in her [place].  1. Stable pelvic ring fracture/ insufficiency fracture of pelvic ring       Nonoperative  Weight bearing as tolerated using walker  PT evals  Will obtain outlet/inlet x-ray views today   Will also check additional hip xray, to include a better lateral as it was suboptimal on previous serious   2. Pain management:  Continue current regimen  3.ABL anemia/Hemodynamics  Stable with Hgb at 9.5  4. Medical issues   Continue home meds  5. DVT/PE prophylaxis:  SCDs bilaterally  Aspirin 84m daily  Lovenox   6. Metabolic Bone Disease:  Metabolic bone panel  7. Activity:  WBAT with walker for  stabilization  PT evals  8. FEN/Foley/Lines:  Heart healthy diet   9. Dispo:  PT evals     Jari Pigg, PA-C [facility] [phone] (P) 01/14/2015, 11:30 AM

## 2015-01-14 NOTE — ED Notes (Signed)
Attempted report 

## 2015-01-14 NOTE — H&P (Addendum)
History and Physical  Patient Name: [name]     KPT:[id number]    DOB: 05-07-1929    DOA: 01/13/2015 Referring physician: Emily Filbert, MD PCP: Kirstie Peri, MD      Chief Complaint: Fall  HPI: [name] is a 81 y.o. female with a past medical history significant for CAD with isch CM last EF30-35% in 2015, CKD stage III, and HTN who presents with fall.  The patient has been suffering from respiratory infection for about a week, and describes feeling run down and poor oral intake this week. Tonight she was alone in the kitchen when she stood up from a chair had her foot caught and fell. She had her head and her pelvis, but was able to crawl to the phone and call 911. She did not lose consciousness, she does not feel lightheaded, dizzy, or presyncopal before the fall. She now complained of headache and groin pain.  In the ED, the patient had mild hypokalemia and was hemodynamically stable. A CT of the head and neck showed no cervical spine fracture or intracranial bleeding. X-ray of the pelvis showed a pubic ramus fracture. Chest x-ray showed prominent interstitial markings but no focal opacities. TRH was asked to admit for pelvic ring fracture, physical therapy, and pain control.     Review of Systems:  All systems negative except as just noted or noted in the history of present illness.  Allergies  Allergen Reactions  . Lisinopril Cough  . Sulfonamide Derivatives Rash and Other (See Comments)    Severe Headache    Prior to Admission medications   Medication Sig Start Date End Date Taking? Authorizing Provider  aspirin EC 81 MG tablet Take 81 mg by mouth daily.   Yes Historical Provider, MD  atorvastatin (LIPITOR) 40 MG tablet Take 40 mg by mouth daily.    Yes Historical Provider, MD  azithromycin (ZITHROMAX) 250 MG tablet Take 250-500 mg by mouth See admin instructions. Take 2 tablets on day 1 and 1 tablet for the next 4 days.   Yes Historical Provider, MD    benzonatate (TESSALON) 100 MG capsule Take 100 mg by mouth 3 (three) times daily as needed for cough.   Yes Historical Provider, MD  carvedilol (COREG) 12.5 MG tablet Take 12.5 mg by mouth 2 (two) times daily. 09/28/10  Yes Kathlen Brunswick, MD  furosemide (LASIX) 40 MG tablet Take 20-40 mg by mouth daily.    Yes Historical Provider, MD  nitroGLYCERIN (NITROSTAT) 0.4 MG SL tablet Place 0.4 mg under the tongue every 5 (five) minutes as needed. For chest pain   Yes Historical Provider, MD  pantoprazole (PROTONIX) 40 MG tablet Take 40 mg by mouth daily.     Yes Historical Provider, MD  potassium chloride SA (K-DUR,KLOR-CON) 20 MEQ tablet Take 0.5 tablets (10 mEq total) by mouth daily. Dose decreased 09/16/2014. 09/16/14  Yes Laqueta Linden, MD    Past Medical History  Diagnosis Date  . Unspecified essential hypertension   . Edema   . Coronary atherosclerosis of native coronary artery     echocardiogram June 2012 ejection fraction 40% with apical and anteroseptal akinesis with scarring.  . Hemothorax on left   . Myocardial infarction of anterior wall greater than eight weeks ago     large anterior wall myocardial infarction status post emergent PCI followed by coronary bypass grafting.,  Ejection fraction 40%  . Carotid artery disease (HCC)     followed by vascular surgery  Carotid duplex  in 02/2010-60-79% right proximal internal carotid artery stenosis; plaque without focal stenosis on the left; no change compared with 08/30/2009.  . Transient atrial fibrillation or flutter     post myocardial infarction and she remains in normal sinus rhythm  . Coronary artery disease     Acute anteroseptal MI treated with urgent PCI and followed by CABG surgery for left main and severe three-vessel coronary disease in 07/2010; prior percutaneous intervention in 05/2007 with DES to the proximal LAD; RCA stent in 09/2009; both stents occluded as well as M1 prior to surgery  . CHF (congestive heart failure) (HCC)    . Cataract     bilateral   . HOH (hard of hearing)   . GERD (gastroesophageal reflux disease)   . Shortness of breath     With ambulation  . Pneumonia   . Creatinine elevation   . Claustrophobia   . Arthritis     back    Past Surgical History  Procedure Laterality Date  . Tonsillectomy    . Colonoscopy  2012  . Sternal wound exploration, evacuation of left hemothorax,  and sternal wou nd closure  08/13/10    [facility]  . Cabgx 3  08/09/10    [name]  . Cardiac catheterization  2001, 2008, 2009, 2012  . Pr vein bypass graft,aorto-fem-pop  07/29/10  . Coronary artery bypass graft  08/08/2010    Triple bypass  . Appendectomy    . Hand surgery      right hand  . Endarterectomy  12/13/2011    Procedure: ENDARTERECTOMY CAROTID;  Surgeon: Larina Earthly, MD;  Location: [facility] OR;  Service: Vascular;  Laterality: Right;  Right Carotid endartectomy with dacron patch angioplasty  . Carotid endarterectomy    . Eye surgery Bilateral 2014    cataract lens implant    Family history: family history includes Aneurysm in her mother; Cancer in her other; Coronary artery disease in her other; Diabetes in her son; Heart attack in her sister; Heart disease in her father, mother, sister, and son; Heart failure in her father; Hyperlipidemia in her mother, sister, and son; Hypertension in her mother, sister, and son; Peripheral vascular disease in her son.  Social History: Patient lives with her grandson. She is a never smoker. She is normally independent with all IADLs and ADLs.       Physical Exam: BP 152/84 mmHg  Pulse 86  Temp(Src) 98 F (36.7 C) (Oral)  Resp 18  Ht  (1.575 m)  Wt 52.6 kg (115 lb 15.4 oz)  BMI 21.20 kg/m2  SpO2 96% General appearance: Thin elderly  female, alert and in no acute distress xcept with movement.   Eyes: Anicteric, conjunctiva pink, lids and lashes normal.     ENT: No nasal deformity, discharge, or epistaxis.  OP moist without lesions.   Lymph: No cervical,  supraclavicular or axillary lymphadenopathy. Skin: Warm and dry.   Cardiac: RRR, nl S1-S2, faint SEM.  Capillary refill is brisk.  JVP normal.  No LE edema.  Radial pulses 2+ and symmetric. Respiratory: Normal respiratory rate and rhythm.  CTAB without rales or wheezes. Abdomen: Abdomen soft without rigidity.  No TTP. No ascites, distension.   MSK: No deformities or effusions. Neuro: Sensorium intact and responding to questions, attention normal.  Speech is fluent.  RLE movement limited by pain.  Moves upper extremities equally.  Cranial nerves intact.  Memory seems intact. Psych: Behavior appropriate.  Affect normal.  No evidence of aural or visual hallucinations  or delusions.       Labs on Admission:  The metabolic panel shows mild hypokalemia, serum creatinine at baseline. The complete blood count shows chronic stable normocytic anemia, no leukocytosis or thrombocytopenia.   Radiological Exams on Admission: Personally reviewed: Dg Chest 1 View 01/14/2015   Prominent interstitial markings without rib fractures or focal airspace disease.  Ct Head Wo Contrast 01/14/2015 IMPRESSION: CT HEAD: Small LEFT parietal scalp hematoma.  No skull fracture. No acute intracranial process. 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 CT CERVICAL SPINE: No acute cervical spine fracture. Grade 1 C4-5 anterolisthesis on degenerative basis.    Personally reviewed: Dg Hip Unilat With Pelvis 2-3 Views Right 01/14/2015  "IMPRESSION: Comminuted fractures of the medial right superior pubic ramus and ischium. Nondisplaced fracture lateral superior pubic ramus on the right. Moderate osteoarthritic change in both hip joints. No dislocations. Bones osteoporotic."     Assessment/Plan  1. Pelvic fracture:  This is new.   -PT/OT and early mobilization -Acetaminophen 1 g 3 times a day -Codeine when necessary    2. Cough:  Unchanged. -Azithromycin  250 mg by mouth last dose today -Codeine with guaifenesin when necessary -Monitor  3. Ischemic cardiomyopathy with chronic systolic CHF:  Clinically stable. -Continue home aspirin, statin, beta blocker, furosemide  4. GERD:  Stable. -Continue home PPI  5. Hypokalemia:  This is new. -Check magnesium -Supplement and continue home potassium   6. Anemia: This is chronic. -Check ferritin, B12, folate     DVT PPx: Lovenox Diet: Cardiac Code Status: Full Medical decision making: What exists of the patient's previous chart was reviewed in depth and the case was discussed with Dr. Judd Lien. Patient seen 2:05 AM on 01/14/2015.  Disposition Plan:  Admit for pain control and early mobilization.  Discharge to home with PT or rehab.      Alberteen Sam [facility] Pager [phone]

## 2015-01-14 NOTE — Progress Notes (Signed)
Received report from [place], RN at [phone]. Will await patient's arrival to [facility].

## 2015-01-14 NOTE — Evaluation (Signed)
Physical Therapy Evaluation Patient Details Name: [name] MRN: [id number] DOB: 1929-11-04 Today's Date: 01/14/2015   History of Present Illness  [name] is an 81 y.o. female who presents after falling in her kitchen around 8:30pm last night. Patient states she was alone in her kitchen when she's not sure how she lost her balance, but ended up falling on the tile floor hitting her head and hip in the process. She was able to crawl to the phone to call her grandson and friend, who arrived and called 911. Upon presentation, patient denies any pain except when she moves her right hip in certain directions. Denies chest pain, worsened shortness of breath, nausea, vomiting or lightheadedness. States that many years ago she was diagnosed with osteoporosis and undergoes a bone scan every 2 years for monitoring. Had one trial of IV medication years ago for her osteoporosis but was unable to tolerate treatment due to GI side effects. Patient currently denies taking any medication, vitamin D or calcium for her bone health. Denies that she falls regularly, but states that she does feel unsteady when walking long distances.  Sustained right pelvic ring fracture.  Nonoperative.  Clinical Impression  Pt admitted with above diagnosis. Pt currently with functional limitations due to the deficits listed below (see PT Problem List). Pt will need 24 hour care at d/c with [facility] services. Pt states grandson can assist but not sure if 24 hour care.  If no 24 hour care, then SNF.   Pt needed +2 assist to get to chair today with RW.  Feel therapy a good plan for pt to gain strength.  Will follow acutely. Pt will benefit from skilled PT to increase their independence and safety with mobility to allow discharge to the venue listed below.    Follow Up Recommendations Supervision/Assistance - 24 hour;Other (comment);SNF (SNF if pt does not have 24 hour care; HHPT and OT if does)    Equipment Recommendations  Rolling walker with 5" wheels;3in1 (PT)    Recommendations for Other Services       Precautions / Restrictions Precautions Precautions: Fall Restrictions Weight Bearing Restrictions: Yes RLE Weight Bearing: Weight bearing as tolerated      Mobility  Bed Mobility Overal bed mobility: Needs Assistance;+2 for physical assistance Bed Mobility: Supine to Sit     Supine to sit: Max assist;+2 for physical assistance     General bed mobility comments: Pt needed assist for LEs and elevation of trunk.    Transfers Overall transfer level: Needs assistance Equipment used: Rolling walker (2 wheeled) Transfers: Sit to/from [employer] Sit to Stand: Mod assist;+2 physical assistance Stand pivot transfers: Min assist;Mod assist;+2 physical assistance       General transfer comment: Pt needed assist to power up.  Took awhile for pt to obtain some stability.  Pt did better with PT not assisting with weight shifting once up on feet.  Needed cues to sequence steps and RW however was able to take pivotal steps with RW to chair.  Once in chair pt c/o nausea and had dry heaves.  Obtained cool washcloth and called nurse to bring nausea meds.    Ambulation/Gait                Stairs            Wheelchair Mobility    Modified Rankin (Stroke Patients Only)       Balance Overall balance assessment: Needs assistance;History of Falls Sitting-balance support: Bilateral upper  extremity supported;Feet supported Sitting balance-Leahy Scale: Poor Sitting balance - Comments: Took awhile for pt to attain sitting balance intiially needing mod assist to sit EOB and then progressed to min guard assist.  Postural control: Posterior lean Standing balance support: Bilateral upper extremity supported;During functional activity Standing balance-Leahy Scale: Poor Standing balance comment: relies on RW for UE support.                              Pertinent  Vitals/Pain Pain Assessment: 0-10 Pain Score: 10-Worst pain ever Pain Location: right pelvis/groin Pain Descriptors / Indicators: Aching;Grimacing;Guarding;Stabbing Pain Intervention(s): Limited activity within patient's tolerance;Monitored during session;Repositioned  Sat 97% on 2LO2     Home Living Family/patient expects to be discharged to:: Private residence Living Arrangements: Alone;Other relatives Available Help at Discharge: Family;Available 24 hours/day (pt states grandson moved in and provides close to 24 hours) Type of Home: House Home Access: Stairs to enter   Secretary/administrator of Steps: 3 Home Layout: One level Home Equipment: None      Prior Function Level of Independence: Independent               Hand Dominance        Extremity/Trunk Assessment   Upper Extremity Assessment: Defer to OT evaluation           Lower Extremity Assessment: RLE deficits/detail;LLE deficits/detail   LLE Deficits / Details: appears grossly 3/5  Cervical / Trunk Assessment: Kyphotic  Communication   Communication: No difficulties  Cognition Arousal/Alertness: Awake/alert Behavior During Therapy: WFL for tasks assessed/performed Overall Cognitive Status: Within Functional Limits for tasks assessed                      General Comments      Exercises        Assessment/Plan    PT Assessment Patient needs continued PT services  PT Diagnosis Difficulty walking;Generalized weakness;Acute pain   PT Problem List Decreased activity tolerance;Decreased balance;Decreased knowledge of use of DME;Decreased safety awareness;Decreased knowledge of precautions;Pain;Decreased mobility;Decreased strength  PT Treatment Interventions DME instruction;Stair training;Gait training;Functional mobility training;Therapeutic activities;Therapeutic exercise;Balance training;Patient/family education   PT Goals (Current goals can be found in the Care Plan section) Acute Rehab  PT Goals Patient Stated Goal: to get better PT Goal Formulation: With patient Time For Goal Achievement: 01/28/15 Potential to Achieve Goals: Good    Frequency Min 5X/week   Barriers to discharge Decreased caregiver support (unsure if grandson can provide 24 hour care)      Co-evaluation               End of Session Equipment Utilized During Treatment: Gait belt;Oxygen Activity Tolerance: Patient limited by fatigue;Patient limited by pain Patient left: in chair;with call bell/phone within reach;with chair alarm set Nurse Communication: Mobility status (pt request nausea med)    Functional Assessment Tool Used: clinical judgment Functional Limitation: Mobility: Walking and moving around Mobility: Walking and Moving Around Current Status ([id number]): At least 40 percent but less than 60 percent impaired, limited or restricted Mobility: Walking and Moving Around Goal Status [phone]): At least 1 percent but less than 20 percent impaired, limited or restricted    Time: 0981-1914 PT Time Calculation (min) (ACUTE ONLY): 24 min   Charges:   PT Evaluation $Initial PT Evaluation Tier I: 1 Procedure PT Treatments $Therapeutic Activity: 8-22 mins   PT G Codes:   PT G-Codes **NOT FOR INPATIENT CLASS** Functional Assessment Tool  Used: clinical judgment Functional Limitation: Mobility: Walking and moving around Mobility: Walking and Moving Around Current Status [phone]): At least 40 percent but less than 60 percent impaired, limited or restricted Mobility: Walking and Moving Around Goal Status [phone]): At least 1 percent but less than 20 percent impaired, limited or restricted    Berline Lopes 01/14/2015, 4:05 PM Maryjane Benedict,PT Acute Rehabilitation [phone] (pager)

## 2015-01-14 NOTE — Progress Notes (Signed)
PATIENT DETAILS Name: [name] Age: 81 y.o. Sex: female Date of Birth: 05-Apr-1929 Admit Date: 01/13/2015 Admitting Physician Alberteen Sam, MD ZOX:[id number],[id number], MD  Subjective: Complains of cough-no shortness of breath. Complains of pain in her pelvis only when she moves.  Assessment/Plan: Principal Problem: Right Pelvic fracture: Secondary to a mechanical fall. Continue supportive care and conservative measures. Mobilize with PT. Orthopedics consulted.  Active Problems: Acute bronchitis: Continues to have cough-has completed a course of Zithromax. Continue with supportive care, incentive spirometry and bronchodilators. Lungs clear in exam-chest x-ray does not show any pneumonia.  Chronic systolic heart failure (EF30-35% in 2015): Clinically compensated, continue Coreg and Lasix. Allergic to ACEI (see last outpatient cardiology note on 11/17/14). Has outpatient follow-up with cardiology.  History of CAD status post CABG: Currently without any chest pain or shortness of breath. Continue aspirin, statin and Coreg.  Hypertension: Currently controlled-continue Coreg and Lasix.  GERD: Continue PPI  Chronic disease stage III: Creatinine close to usual baseline. Follow.  Anemia: Hemoglobin stable-likely secondary to chronic kidney disease. Monitor periodically.  Disposition: Remain inpatient-await recommendations from physical therapy and orthopedics  Antimicrobial agents  See below  Anti-infectives    Start     Dose/Rate Route Frequency Ordered Stop   01/14/15 0234  azithromycin (ZITHROMAX) tablet 250 mg     250 mg Oral See admin instructions 01/14/15 0234        DVT Prophylaxis: Prophylactic Lovenox   Code Status: Full code   Family Communication None at bedside  Procedures: None  CONSULTS:  orthopedic surgery  Time spent 30 minutes-Greater than 50% of this time was spent in counseling, explanation of diagnosis, planning of  further management, and coordination of care.  MEDICATIONS: Scheduled Meds: . acetaminophen  1,000 mg Oral 3 times per day  . aspirin EC  81 mg Oral Daily  . atorvastatin  40 mg Oral Daily  . azithromycin  250 mg Oral See admin instructions  . carvedilol  12.5 mg Oral BID  . enoxaparin (LOVENOX) injection  40 mg Subcutaneous Q24H  . furosemide  20 mg Oral Daily  . guaiFENesin  600 mg Oral BID  . ipratropium-albuterol  3 mL Nebulization Q8H  . pantoprazole  40 mg Oral Daily  . polyethylene glycol  17 g Oral Daily  . potassium chloride SA  10 mEq Oral Daily   Continuous Infusions:  PRN Meds:.albuterol, morphine injection, traMADol    PHYSICAL EXAM: Vital signs in last 24 hours: Filed Vitals:   01/14/15 0130 01/14/15 0200 01/14/15 0244 01/14/15 0426  BP: 141/67 152/84 152/84 142/72  Pulse: 78 95 86 88  Temp:   98 F (36.7 C) 99.1 F (37.3 C)  TempSrc:   Oral Oral  Resp:  Height:    (1.575 m)   Weight:   52.6 kg (115 lb 15.4 oz)   SpO2: 89% 100% 96% 98%    Weight change:  Filed Weights   01/13/15 2255 01/14/15 0244  Weight: 52.617 kg (116 lb) 52.6 kg (115 lb 15.4 oz)   Body mass index is 21.2 kg/(m^2).   Gen Exam: Awake and alert with clear speech.  Neck: Supple, No JVD.   Chest: B/L Clear.   CVS: S1 S2 Regular, no murmurs.  Abdomen: soft, BS +, non tender, non distended.  Extremities: no edema, lower extremities warm to touch Neurologic: Non Focal.   Skin: No Rash.  Wounds: N/A.    Intake/Output from previous day:  Intake/Output Summary (Last 24 hours) at 01/14/15 1127 Last data filed at 01/14/15 0701  Gross per 24 hour  Intake     50 ml  Output      0 ml  Net     50 ml     LAB RESULTS: CBC  Recent Labs Lab 01/14/15 0026  WBC 7.9  HGB 9.5*  HCT 28.8*  PLT 201  MCV 90.0  MCH 29.7  MCHC 33.0  RDW 12.7  LYMPHSABS 1.1  MONOABS 0.5  EOSABS 0.2  BASOSABS 0.0    Chemistries   Recent Labs Lab 01/14/15 0026  NA 140  K 3.1*   CL 102  CO2 23  GLUCOSE 177*  BUN 20  CREATININE 1.23*  CALCIUM 7.2*    CBG: No results for input(s): GLUCAP in the last 168 hours.  GFR Estimated Creatinine Clearance: 27.4 mL/min (by C-G formula based on Cr of 1.23).  Coagulation profile  Recent Labs Lab 01/14/15 0026  INR 1.30    Cardiac Enzymes No results for input(s): CKMB, TROPONINI, MYOGLOBIN in the last 168 hours.  Invalid input(s): CK  Invalid input(s): POCBNP No results for input(s): DDIMER in the last 72 hours. No results for input(s): HGBA1C in the last 72 hours. No results for input(s): CHOL, HDL, LDLCALC, TRIG, CHOLHDL, LDLDIRECT in the last 72 hours. No results for input(s): TSH, T4TOTAL, T3FREE, THYROIDAB in the last 72 hours.  Invalid input(s): FREET3 No results for input(s): VITAMINB12, FOLATE, FERRITIN, TIBC, IRON, RETICCTPCT in the last 72 hours. No results for input(s): LIPASE, AMYLASE in the last 72 hours.  Urine Studies No results for input(s): UHGB, CRYS in the last 72 hours.  Invalid input(s): UACOL, UAPR, USPG, UPH, UTP, UGL, UKET, UBIL, UNIT, UROB, ULEU, UEPI, UWBC, URBC, UBAC, CAST, UCOM, BILUA  MICROBIOLOGY: No results found for this or any previous visit (from the past 240 hour(s)).  RADIOLOGY STUDIES/RESULTS: Dg Chest 1 View  01/14/2015  CLINICAL DATA:  Pain following fall EXAM: CHEST 1 VIEW COMPARISON:  Aug 02, 2014 FINDINGS: The interstitium remains rather prominent with questionable mild generalized interstitial edema. No airspace consolidation. There is cardiomegaly with pulmonary vascularity within normal limits. Patient is status post coronary artery bypass grafting. No adenopathy. Bones are osteoporotic. No pneumothorax. No acute fracture seen. IMPRESSION: Cardiomegaly. Suspect mild interstitial edema with a degree of chronic congestive heart failure. No airspace consolidation. Electronically Signed   By: Bretta Bang III M.D.   On: 01/14/2015 00:11   Ct Head Wo  Contrast  01/14/2015  CLINICAL DATA:  Larey Seat and hit head and neck on floor. Severe neck, base of skull pain. History of hypertension. EXAM: CT HEAD WITHOUT CONTRAST CT CERVICAL SPINE WITHOUT CONTRAST TECHNIQUE: Multidetector CT imaging of the head and cervical spine was performed following the standard protocol without intravenous contrast. Multiplanar CT image reconstructions of the cervical spine were also generated. COMPARISON:  CT CT head and angiogram of the neck November 19, 2011 FINDINGS: CT HEAD FINDINGS The ventricles and sulci are normal for age. No intraparenchymal hemorrhage, mass effect nor midline shift. Patchy supratentorial white matter hypodensities are within normal range for patient's age and though non-specific suggest sequelae of chronic small vessel ischemic disease. No acute large vascular territory infarcts. Old RIGHT caudate head lacunar infarct. No abnormal extra-axial fluid collections. Basal cisterns are patent. Severe calcific atherosclerosis of the carotid siphons. No skull fracture. Small LEFT parietal scalp hematoma without subcutaneous gas or  radiopaque foreign bodies. The included ocular globes and orbital contents are non-suspicious. Status post bilateral ocular lens implants. Atretic, chronic RIGHT maxillary sinusitis with small calcified mucosal retention cysts. The mastoid air cells are well aerated. Patient is edentulous. Severe temporomandibular osteoarthrosis. CT CERVICAL SPINE FINDINGS Cervical vertebral bodies intact. Grade 1 C4-5 anterolisthesis without spondylolysis, the C4-5 facets are fused on degenerative basis. Moderate to severe C5-6 disc height loss, uncovertebral hypertrophy and endplate spurring consistent with degenerative disc. C1-2 articulation maintained with moderate to severe arthropathy. Small amount of calcified pannus about the odontoid process. No destructive bony lesions. Partially imaged median sternotomy. Severe calcific atherosclerosis of the LEFT  carotid bulb, similar to prior CT. Heterogeneous dominant 2.6 cm thyroid nodule, stable from 2013. Moderate to severe C5-6 neural foraminal narrowing. IMPRESSION: CT HEAD: Small LEFT parietal scalp hematoma.  No skull fracture. No acute intracranial process. 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 CT CERVICAL SPINE: No acute cervical spine fracture. Grade 1 C4-5 anterolisthesis on degenerative basis. Electronically Signed   By: Awilda Metro M.D.   On: 01/14/2015 00:32   Ct Cervical Spine Wo Contrast  01/14/2015  CLINICAL DATA:  Larey Seat and hit head and neck on floor. Severe neck, base of skull pain. History of hypertension. EXAM: CT HEAD WITHOUT CONTRAST CT CERVICAL SPINE WITHOUT CONTRAST TECHNIQUE: Multidetector CT imaging of the head and cervical spine was performed following the standard protocol without intravenous contrast. Multiplanar CT image reconstructions of the cervical spine were also generated. COMPARISON:  CT CT head and angiogram of the neck November 19, 2011 FINDINGS: CT HEAD FINDINGS The ventricles and sulci are normal for age. No intraparenchymal hemorrhage, mass effect nor midline shift. Patchy supratentorial white matter hypodensities are within normal range for patient's age and though non-specific suggest sequelae of chronic small vessel ischemic disease. No acute large vascular territory infarcts. Old RIGHT caudate head lacunar infarct. No abnormal extra-axial fluid collections. Basal cisterns are patent. Severe calcific atherosclerosis of the carotid siphons. No skull fracture. Small LEFT parietal scalp hematoma without subcutaneous gas or radiopaque foreign bodies. The included ocular globes and orbital contents are non-suspicious. Status post bilateral ocular lens implants. Atretic, chronic RIGHT maxillary sinusitis with small calcified mucosal retention cysts. The mastoid air cells are well aerated. Patient is edentulous.  Severe temporomandibular osteoarthrosis. CT CERVICAL SPINE FINDINGS Cervical vertebral bodies intact. Grade 1 C4-5 anterolisthesis without spondylolysis, the C4-5 facets are fused on degenerative basis. Moderate to severe C5-6 disc height loss, uncovertebral hypertrophy and endplate spurring consistent with degenerative disc. C1-2 articulation maintained with moderate to severe arthropathy. Small amount of calcified pannus about the odontoid process. No destructive bony lesions. Partially imaged median sternotomy. Severe calcific atherosclerosis of the LEFT carotid bulb, similar to prior CT. Heterogeneous dominant 2.6 cm thyroid nodule, stable from 2013. Moderate to severe C5-6 neural foraminal narrowing. IMPRESSION: CT HEAD: Small LEFT parietal scalp hematoma.  No skull fracture. No acute intracranial process. 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 CT CERVICAL SPINE: No acute cervical spine fracture. Grade 1 C4-5 anterolisthesis on degenerative basis. Electronically Signed   By: Awilda Metro M.D.   On: 01/14/2015 00:32   Dg Hip Unilat With Pelvis 2-3 Views Right  01/14/2015  CLINICAL DATA:  Pain following fall EXAM: DG HIP (WITH OR WITHOUT PELVIS) 2-3V RIGHT COMPARISON:  None. FINDINGS: Frontal pelvis as well as frontal and lateral right hip images were obtained. There  is a comminuted fracture of the medial right superior pubic ramus as well as a comminuted fracture of the medial right ischium. There is a nondisplaced fracture of the lateral right superior pubic ramus as well. No other fractures. No dislocation. There is moderate symmetric narrowing of both hip joints. Bones are somewhat osteoporotic. IMPRESSION: Comminuted fractures of the medial right superior pubic ramus and ischium. Nondisplaced fracture lateral superior pubic ramus on the right. Moderate osteoarthritic change in both hip joints. No dislocations. Bones osteoporotic.  Electronically Signed   By: Bretta Bang III M.D.   On: 01/14/2015 00:09    Jeoffrey Massed, MD  [facility] Pager:[phone]  If 7PM-7AM, please contact night-coverage [email or web address] Password [email or web address] 01/14/2015, 11:27 AM   LOS: 0 days

## 2015-01-14 NOTE — Progress Notes (Signed)
PT Note Noted pt with pelvic fracture.  MD ** please clarify if patient has any weight bearing restrictions**  Will await clarification before completing PT evaluation. Thanks [place], [place]  [phone] 01/14/2015

## 2015-01-15 DIAGNOSIS — S32601A Unspecified fracture of right ischium, initial encounter for closed fracture: Secondary | ICD-10-CM | POA: Diagnosis not present

## 2015-01-15 DIAGNOSIS — I10 Essential (primary) hypertension: Secondary | ICD-10-CM | POA: Diagnosis not present

## 2015-01-15 DIAGNOSIS — I5022 Chronic systolic (congestive) heart failure: Secondary | ICD-10-CM | POA: Diagnosis not present

## 2015-01-15 DIAGNOSIS — N183 Chronic kidney disease, stage 3 (moderate): Secondary | ICD-10-CM | POA: Diagnosis not present

## 2015-01-15 DIAGNOSIS — S32511A Fracture of superior rim of right pubis, initial encounter for closed fracture: Secondary | ICD-10-CM | POA: Diagnosis not present

## 2015-01-15 LAB — PTH, INTACT AND CALCIUM
Calcium, Total (PTH): 6.9 mg/dL — CL (ref 8.7–10.3)
PTH: 71 pg/mL — AB (ref 15–65)

## 2015-01-15 LAB — COMPREHENSIVE METABOLIC PANEL
ALBUMIN: 3 g/dL — AB (ref 3.5–5.0)
ALK PHOS: 56 U/L (ref 38–126)
ALT: 29 U/L (ref 14–54)
ANION GAP: 11 (ref 5–15)
AST: 30 U/L (ref 15–41)
BILIRUBIN TOTAL: 0.6 mg/dL (ref 0.3–1.2)
BUN: 22 mg/dL — AB (ref 6–20)
CALCIUM: 7.3 mg/dL — AB (ref 8.9–10.3)
CO2: 24 mmol/L (ref 22–32)
Chloride: 106 mmol/L (ref 101–111)
Creatinine, Ser: 1.34 mg/dL — ABNORMAL HIGH (ref 0.44–1.00)
GFR calc Af Amer: 41 mL/min — ABNORMAL LOW (ref >60)
GFR calc non Af Amer: 36 mL/min — ABNORMAL LOW (ref >60)
GLUCOSE: 130 mg/dL — AB (ref 65–99)
Potassium: 4.5 mmol/L (ref 3.5–5.1)
Sodium: 141 mmol/L (ref 135–145)
Total Protein: 7.3 g/dL (ref 6.5–8.1)

## 2015-01-15 LAB — PREALBUMIN: Prealbumin: 14.5 mg/dL — ABNORMAL LOW (ref 18–38)

## 2015-01-15 LAB — MAGNESIUM: Magnesium: 1.2 mg/dL — ABNORMAL LOW (ref 1.7–2.4)

## 2015-01-15 MED ORDER — MAGNESIUM SULFATE 2 GM/50ML IV SOLN
2.0000 g | Freq: Once | INTRAVENOUS | Status: AC
  Administered 2015-01-15: 2 g via INTRAVENOUS
  Filled 2015-01-15: qty 50

## 2015-01-15 MED ORDER — SODIUM CHLORIDE 0.9 % IV SOLN
1.0000 g | Freq: Once | INTRAVENOUS | Status: AC
  Administered 2015-01-15: 1 g via INTRAVENOUS
  Filled 2015-01-15: qty 10

## 2015-01-15 MED ORDER — ONDANSETRON HCL 4 MG/2ML IJ SOLN: 4.0000 mg | Freq: Four times a day (QID) | INTRAMUSCULAR | Status: DC | PRN

## 2015-01-15 MED ORDER — CALCIUM CARBONATE 1250 (500 CA) MG PO TABS
1.0000 | ORAL_TABLET | Freq: Two times a day (BID) | ORAL | Status: DC
  Administered 2015-01-15 – 2015-01-17 (×4): 500 mg via ORAL
  Filled 2015-01-15 (×7): qty 1

## 2015-01-15 NOTE — Progress Notes (Signed)
PATIENT DETAILS Name: [name] Age: 81 y.o. Sex: female Date of Birth: 12-May-1929 Admit Date: 01/13/2015 Admitting Physician Alberteen Sam, MD WUJ:WJXB,JYNWGN, MD  Subjective: Complains of cough-no shortness of breath. Still complaining of pain in her pelvis only when she moves. Unable to get hold of family to discuss about discharge office.  Assessment/Plan: Principal Problem: Right Pelvic fracture: Secondary to a mechanical fall. Continue supportive care and conservative measures. PT eval completed-recommendations are for either SNF or home health PT with a 24/7 supervision.Tried to call patient's grandson-unable to get in touch with him-left a message. Appreciate orthopedics e  Active Problems: Acute bronchitis: Continues to have cough-but lungs clear-has completed a course of Zithromax. Chest x-ray without pneumonia. Continue with supportive care, incentive spirometry and bronchodilators.   Hypomagnesemia: Suspect secondary to diuretic treatment. Repeat and recheck.  Hypocalcemia: Corrected calcium around 8.1. Await PTH/vitamin D panel-given IV calcium gluconate today-and start oral calcium supplementation.  Chronic systolic heart failure (EF30-35% in 2015): Clinically compensated, continue Coreg and Lasix. Allergic to ACEI (see last outpatient cardiology note on 11/17/14). Has outpatient follow-up with cardiology.  History of CAD status post CABG: Currently without any chest pain or shortness of breath. Continue aspirin, statin and Coreg.  Hypertension: Currently controlled-continue Coreg and Lasix.  GERD: Continue PPI  Chronic disease stage III: Creatinine close to usual baseline. Follow.  Anemia: Hemoglobin stable-likely secondary to chronic kidney disease. Monitor periodically.  Disposition: Remain inpatient-will continue to try to get in touch with patient's family, await social work evaluation (ordered) to see if patient can be placed to  SNF  Antimicrobial agents  See below  Anti-infectives    Start     Dose/Rate Route Frequency Ordered Stop   01/14/15 0234  azithromycin (ZITHROMAX) tablet 250 mg     250 mg Oral See admin instructions 01/14/15 0234        DVT Prophylaxis: Prophylactic Lovenox   Code Status: Full code   Family Communication None at bedside  Procedures: None  CONSULTS:  orthopedic surgery  Time spent 30 minutes-Greater than 50% of this time was spent in counseling, explanation of diagnosis, planning of further management, and coordination of care.  MEDICATIONS: Scheduled Meds: . acetaminophen  1,000 mg Oral 3 times per day  . aspirin EC  81 mg Oral Daily  . atorvastatin  40 mg Oral Daily  . azithromycin  250 mg Oral See admin instructions  . calcium carbonate  1 tablet Oral BID WC  . calcium gluconate  1 g Intravenous Once  . carvedilol  12.5 mg Oral BID  . enoxaparin (LOVENOX) injection  30 mg Subcutaneous Q24H  . furosemide  20 mg Oral Daily  . guaiFENesin  600 mg Oral BID  . ipratropium-albuterol  3 mL Nebulization Q8H  . magnesium sulfate 1 - 4 g bolus IVPB  2 g Intravenous Once  . pantoprazole  40 mg Oral Daily  . polyethylene glycol  17 g Oral Daily  . potassium chloride SA  20 mEq Oral Daily   Continuous Infusions:  PRN Meds:.albuterol, morphine injection, traMADol    PHYSICAL EXAM: Vital signs in last 24 hours: Filed Vitals:   01/14/15 2029 01/14/15 2113 01/15/15 0503 01/15/15 0550  BP: 129/61  143/68   Pulse: 70  75   Temp: 98 F (36.7 C)  97.3 F (36.3 C)   TempSrc: Oral  Oral   Resp: 18  18  Height:      Weight:      SpO2: 97% 97% 98% 95%    Weight change:  Filed Weights   01/13/15 2255 01/14/15 0244  Weight: 52.617 kg (116 lb) 52.6 kg (115 lb 15.4 oz)   Body mass index is 21.2 kg/(m^2).   Gen Exam: Awake and alert with clear speech.  Neck: Supple, No JVD.   Chest: B/L Clear.   CVS: S1 S2 Regular, no murmurs.  Abdomen: soft, BS +, non tender,  non distended.  Extremities: no edema, lower extremities warm to touch Neurologic: Non Focal.   Skin: No Rash.  Wounds: N/A.    Intake/Output from previous day:  Intake/Output Summary (Last 24 hours) at 01/15/15 0927 Last data filed at 01/15/15 0748  Gross per 24 hour  Intake    720 ml  Output      0 ml  Net    720 ml     LAB RESULTS: CBC  Recent Labs Lab 01/14/15 0026  WBC 7.9  HGB 9.5*  HCT 28.8*  PLT 201  MCV 90.0  MCH 29.7  MCHC 33.0  RDW 12.7  LYMPHSABS 1.1  MONOABS 0.5  EOSABS 0.2  BASOSABS 0.0    Chemistries   Recent Labs Lab 01/14/15 0026 01/14/15 1307 01/15/15 0726  NA 140  --  141  K 3.1*  --  4.5  CL 102  --  106  CO2 23  --  24  GLUCOSE 177*  --  130*  BUN 20  --  22*  CREATININE 1.23*  --  1.34*  CALCIUM 7.2*  --  7.3*  MG  --  0.7* 1.2*    CBG: No results for input(s): GLUCAP in the last 168 hours.  GFR Estimated Creatinine Clearance: 25.2 mL/min (by C-G formula based on Cr of 1.34).  Coagulation profile  Recent Labs Lab 01/14/15 0026  INR 1.30    Cardiac Enzymes No results for input(s): CKMB, TROPONINI, MYOGLOBIN in the last 168 hours.  Invalid input(s): CK  Invalid input(s): POCBNP No results for input(s): DDIMER in the last 72 hours. No results for input(s): HGBA1C in the last 72 hours. No results for input(s): CHOL, HDL, LDLCALC, TRIG, CHOLHDL, LDLDIRECT in the last 72 hours.  Recent Labs  01/14/15 1307  TSH 3.558   No results for input(s): VITAMINB12, FOLATE, FERRITIN, TIBC, IRON, RETICCTPCT in the last 72 hours. No results for input(s): LIPASE, AMYLASE in the last 72 hours.  Urine Studies No results for input(s): UHGB, CRYS in the last 72 hours.  Invalid input(s): UACOL, UAPR, USPG, UPH, UTP, UGL, UKET, UBIL, UNIT, UROB, ULEU, UEPI, UWBC, URBC, UBAC, CAST, UCOM, BILUA  MICROBIOLOGY: No results found for this or any previous visit (from the past 240 hour(s)).  RADIOLOGY STUDIES/RESULTS: Dg Chest 1  View  01/14/2015  CLINICAL DATA:  Pain following fall EXAM: CHEST 1 VIEW COMPARISON:  Aug 02, 2014 FINDINGS: The interstitium remains rather prominent with questionable mild generalized interstitial edema. No airspace consolidation. There is cardiomegaly with pulmonary vascularity within normal limits. Patient is status post coronary artery bypass grafting. No adenopathy. Bones are osteoporotic. No pneumothorax. No acute fracture seen. IMPRESSION: Cardiomegaly. Suspect mild interstitial edema with a degree of chronic congestive heart failure. No airspace consolidation. Electronically Signed   By: Bretta Bang III M.D.   On: 01/14/2015 00:11   Ct Head Wo Contrast  01/14/2015  CLINICAL DATA:  Larey Seat and hit head and neck on floor. Severe neck, base of  skull pain. History of hypertension. EXAM: CT HEAD WITHOUT CONTRAST CT CERVICAL SPINE WITHOUT CONTRAST TECHNIQUE: Multidetector CT imaging of the head and cervical spine was performed following the standard protocol without intravenous contrast. Multiplanar CT image reconstructions of the cervical spine were also generated. COMPARISON:  CT CT head and angiogram of the neck November 19, 2011 FINDINGS: CT HEAD FINDINGS The ventricles and sulci are normal for age. No intraparenchymal hemorrhage, mass effect nor midline shift. Patchy supratentorial white matter hypodensities are within normal range for patient's age and though non-specific suggest sequelae of chronic small vessel ischemic disease. No acute large vascular territory infarcts. Old RIGHT caudate head lacunar infarct. No abnormal extra-axial fluid collections. Basal cisterns are patent. Severe calcific atherosclerosis of the carotid siphons. No skull fracture. Small LEFT parietal scalp hematoma without subcutaneous gas or radiopaque foreign bodies. The included ocular globes and orbital contents are non-suspicious. Status post bilateral ocular lens implants. Atretic, chronic RIGHT maxillary sinusitis  with small calcified mucosal retention cysts. The mastoid air cells are well aerated. Patient is edentulous. Severe temporomandibular osteoarthrosis. CT CERVICAL SPINE FINDINGS Cervical vertebral bodies intact. Grade 1 C4-5 anterolisthesis without spondylolysis, the C4-5 facets are fused on degenerative basis. Moderate to severe C5-6 disc height loss, uncovertebral hypertrophy and endplate spurring consistent with degenerative disc. C1-2 articulation maintained with moderate to severe arthropathy. Small amount of calcified pannus about the odontoid process. No destructive bony lesions. Partially imaged median sternotomy. Severe calcific atherosclerosis of the LEFT carotid bulb, similar to prior CT. Heterogeneous dominant 2.6 cm thyroid nodule, stable from 2013. Moderate to severe C5-6 neural foraminal narrowing. IMPRESSION: CT HEAD: Small LEFT parietal scalp hematoma.  No skull fracture. No acute intracranial process. 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 CT CERVICAL SPINE: No acute cervical spine fracture. Grade 1 C4-5 anterolisthesis on degenerative basis. Electronically Signed   By: Awilda Metro M.D.   On: 01/14/2015 00:32   Ct Cervical Spine Wo Contrast  01/14/2015  CLINICAL DATA:  Larey Seat and hit head and neck on floor. Severe neck, base of skull pain. History of hypertension. EXAM: CT HEAD WITHOUT CONTRAST CT CERVICAL SPINE WITHOUT CONTRAST TECHNIQUE: Multidetector CT imaging of the head and cervical spine was performed following the standard protocol without intravenous contrast. Multiplanar CT image reconstructions of the cervical spine were also generated. COMPARISON:  CT CT head and angiogram of the neck November 19, 2011 FINDINGS: CT HEAD FINDINGS The ventricles and sulci are normal for age. No intraparenchymal hemorrhage, mass effect nor midline shift. Patchy supratentorial white matter hypodensities are within normal range for patient's  age and though non-specific suggest sequelae of chronic small vessel ischemic disease. No acute large vascular territory infarcts. Old RIGHT caudate head lacunar infarct. No abnormal extra-axial fluid collections. Basal cisterns are patent. Severe calcific atherosclerosis of the carotid siphons. No skull fracture. Small LEFT parietal scalp hematoma without subcutaneous gas or radiopaque foreign bodies. The included ocular globes and orbital contents are non-suspicious. Status post bilateral ocular lens implants. Atretic, chronic RIGHT maxillary sinusitis with small calcified mucosal retention cysts. The mastoid air cells are well aerated. Patient is edentulous. Severe temporomandibular osteoarthrosis. CT CERVICAL SPINE FINDINGS Cervical vertebral bodies intact. Grade 1 C4-5 anterolisthesis without spondylolysis, the C4-5 facets are fused on degenerative basis. Moderate to severe C5-6 disc height loss, uncovertebral hypertrophy and endplate spurring consistent with degenerative disc. C1-2 articulation maintained with moderate to severe arthropathy. Small amount of calcified pannus about the odontoid process. No  destructive bony lesions. Partially imaged median sternotomy. Severe calcific atherosclerosis of the LEFT carotid bulb, similar to prior CT. Heterogeneous dominant 2.6 cm thyroid nodule, stable from 2013. Moderate to severe C5-6 neural foraminal narrowing. IMPRESSION: CT HEAD: Small LEFT parietal scalp hematoma.  No skull fracture. No acute intracranial process. 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 CT CERVICAL SPINE: No acute cervical spine fracture. Grade 1 C4-5 anterolisthesis on degenerative basis. Electronically Signed   By: Awilda Metro M.D.   On: 01/14/2015 00:32   Dg Hip Unilat With Pelvis 2-3 Views Right  01/14/2015  CLINICAL DATA:  Pain following fall EXAM: DG HIP (WITH OR WITHOUT PELVIS) 2-3V RIGHT COMPARISON:  None. FINDINGS:  Frontal pelvis as well as frontal and lateral right hip images were obtained. There is a comminuted fracture of the medial right superior pubic ramus as well as a comminuted fracture of the medial right ischium. There is a nondisplaced fracture of the lateral right superior pubic ramus as well. No other fractures. No dislocation. There is moderate symmetric narrowing of both hip joints. Bones are somewhat osteoporotic. IMPRESSION: Comminuted fractures of the medial right superior pubic ramus and ischium. Nondisplaced fracture lateral superior pubic ramus on the right. Moderate osteoarthritic change in both hip joints. No dislocations. Bones osteoporotic. Electronically Signed   By: Bretta Bang III M.D.   On: 01/14/2015 00:09    Jeoffrey Massed, MD  [facility] Pager:[phone]  If 7PM-7AM, please contact night-coverage [email or web address] Password [email or web address] 01/15/2015, 9:27 AM   LOS: 1 day

## 2015-01-15 NOTE — Progress Notes (Signed)
[facility] (OTS)  Subjective: Pain slightly improved, sitting in chair  Objective: Unchanged  Assessment/Plan: Metabolic bone w/u in progress.  Cont PT F/u in office in two wks post d/c  Myrene Galas, MD [facility] [phone] (p)

## 2015-01-15 NOTE — Progress Notes (Addendum)
Cm called the phone of grandson's girlfriend, Nehemiah Settle, @ [phone]. CM spoke  to [employer] who said that he can be contacted @  [phone]. CM asked grandson if he and other family could provide 24 hour support. Grandson said that he works intermittently with "odd and end" jobs and has a young child that he watches so said he can't commit to being available to that extent. CM called RN, Ginger to advise of information. CM Called patient room and spoke to patient who said that she wants to go home if possible but if she is not able to walk independently with assistive devices then then she will need a SNF since her family may not be there 24 hours a day. Patient said that she has a walker and BSC through her church. Patient does not have a wheelchair but states it is not needed at this time. Patient would like to see how she does with next PT eval and decide at that time. Cm will continue to follow for discharge planning needs.

## 2015-01-15 NOTE — Progress Notes (Signed)
Physical Therapy Treatment Patient Details Name: [name] MRN: [id number] DOB: December 10, 1929 Today's Date: 01/15/2015    History of Present Illness [name] is an 81 y.o. female who presents after falling in her kitchen around 8:30pm last night. Patient states she was alone in her kitchen when she's not sure how she lost her balance, but ended up falling on the tile floor hitting her head and hip in the process. She was able to crawl to the phone to call her grandson and friend, who arrived and called [phone]. Upon presentation, patient denies any pain except when she moves her right hip in certain directions. Denies chest pain, worsened shortness of breath, nausea, vomiting or lightheadedness. States that many years ago she was diagnosed with osteoporosis and undergoes a bone scan every 2 years for monitoring. Had one trial of IV medication years ago for her osteoporosis but was unable to tolerate treatment due to GI side effects. Patient currently denies taking any medication, vitamin D or calcium for her bone health. Denies that she falls regularly, but states that she does feel unsteady when walking long distances.  Sustained right pelvic ring fracture.  Nonoperative.    PT Comments    Pt making steady progress toward goals. Continues to have pain with movement. Acute PT to continue during pt's stay working toward mobility goals.  Follow Up Recommendations  Supervision/Assistance - 24 hour;Other (comment);SNF (SNF if pt does not have 24 hour/A; HHPT/OT if pt does)     Equipment Recommendations  Rolling walker with 5" wheels;3in1 (PT)    Precautions / Restrictions Precautions Precautions: Fall Restrictions RLE Weight Bearing: Weight bearing as tolerated    Mobility  Bed Mobility Overal bed mobility: Needs Assistance Bed Mobility: Supine to Sit     Supine to sit: Mod assist     General bed mobility comments: with bed elevated ~20 degrees and bed rail used. cues on  sequencing and technique. Pad under pt's hips used for trunk and pelvic movement to edge of bed. once at edge, pt needed min assist to sit up and then min assist to complete scooting to edge of bed (pad under pt used for this).                          Transfers Overall transfer level: Needs assistance Equipment used: Standard walker Transfers: Sit to/from Stand Sit to Stand: Min assist;From elevated surface;+2 safety/equipment         General transfer comment: cues on hand placement for saftey. pt limiting weight on right leg, however for most part was steady and requested "let me do it".  Ambulation/Gait Ambulation/Gait assistance: Min assist;+2 safety/equipment Ambulation Distance (Feet): 8 Feet Assistive device: Rolling walker (2 wheeled) Gait Pattern/deviations: Step-to pattern;Antalgic;Trunk flexed;Narrow base of support;Decreased stance time - right;Decreased step length - left;Decreased step length - right;Shuffle Gait velocity: decreased Gait velocity interpretation: <1.8 ft/sec, indicative of risk for recurrent falls General Gait Details: cues on sequence with gait/walker, cues to "bear down" on walker to advance left foot by stepping vs slidding it on floor. pt able to self advance right leg most steps, occasional min assist needed. increased time needed.       Cognition Arousal/Alertness: Awake/alert Behavior During Therapy: WFL for tasks assessed/performed Overall Cognitive Status: Within Functional Limits for tasks assessed            Pertinent Vitals/Pain Pain Assessment: No/denies pain Pain Score: 7  Pain Location: right hip/pelvic area  with movement Pain Descriptors / Indicators: Aching;Discomfort;Sharp Pain Intervention(s): Limited activity within patient's tolerance;Monitored during session;Premedicated before session;Repositioned     PT Goals (current goals can now be found in the care plan section) Acute Rehab PT Goals Patient Stated Goal: to get  better PT Goal Formulation: With patient Time For Goal Achievement: 01/28/15 Potential to Achieve Goals: Good Progress towards PT goals: Progressing toward goals    Frequency  Min 5X/week    PT Plan Current plan remains appropriate    End of Session Equipment Utilized During Treatment: Gait belt;Oxygen Activity Tolerance: Patient tolerated treatment well;Patient limited by fatigue;Patient limited by pain Patient left: in chair;with call bell/phone within reach;with nursing/sitter in room;with chair alarm set     Time: 1026-1050 PT Time Calculation (min) (ACUTE ONLY): 24 min  Charges:  $Gait Training: 8-22 mins $Therapeutic Activity: 8-22 mins            Sallyanne Kuster 01/15/2015, 11:20 AM  Sallyanne Kuster, PTA, CLT Acute Rehab Services [phone] 01/15/15, 11:22 AM

## 2015-01-16 DIAGNOSIS — I255 Ischemic cardiomyopathy: Secondary | ICD-10-CM | POA: Diagnosis not present

## 2015-01-16 DIAGNOSIS — I480 Paroxysmal atrial fibrillation: Secondary | ICD-10-CM

## 2015-01-16 DIAGNOSIS — I5022 Chronic systolic (congestive) heart failure: Secondary | ICD-10-CM | POA: Diagnosis not present

## 2015-01-16 DIAGNOSIS — N183 Chronic kidney disease, stage 3 (moderate): Secondary | ICD-10-CM | POA: Diagnosis not present

## 2015-01-16 DIAGNOSIS — I2510 Atherosclerotic heart disease of native coronary artery without angina pectoris: Secondary | ICD-10-CM

## 2015-01-16 DIAGNOSIS — S32511A Fracture of superior rim of right pubis, initial encounter for closed fracture: Secondary | ICD-10-CM | POA: Diagnosis not present

## 2015-01-16 LAB — HEMOGLOBIN A1C
Hgb A1c MFr Bld: 6.1 % — ABNORMAL HIGH (ref 4.8–5.6)
Mean Plasma Glucose: 128 mg/dL

## 2015-01-16 LAB — COMPREHENSIVE METABOLIC PANEL
ALBUMIN: 2.8 g/dL — AB (ref 3.5–5.0)
ALT: 23 U/L (ref 14–54)
AST: 19 U/L (ref 15–41)
Alkaline Phosphatase: 48 U/L (ref 38–126)
Anion gap: 10 (ref 5–15)
BUN: 17 mg/dL (ref 6–20)
CHLORIDE: 105 mmol/L (ref 101–111)
CO2: 24 mmol/L (ref 22–32)
Calcium: 8 mg/dL — ABNORMAL LOW (ref 8.9–10.3)
Creatinine, Ser: 1.18 mg/dL — ABNORMAL HIGH (ref 0.44–1.00)
GFR calc Af Amer: 48 mL/min — ABNORMAL LOW (ref >60)
GFR calc non Af Amer: 41 mL/min — ABNORMAL LOW (ref >60)
GLUCOSE: 120 mg/dL — AB (ref 65–99)
POTASSIUM: 4.3 mmol/L (ref 3.5–5.1)
SODIUM: 139 mmol/L (ref 135–145)
Total Bilirubin: 0.7 mg/dL (ref 0.3–1.2)
Total Protein: 6.4 g/dL — ABNORMAL LOW (ref 6.5–8.1)

## 2015-01-16 LAB — MAGNESIUM: MAGNESIUM: 1.9 mg/dL (ref 1.7–2.4)

## 2015-01-16 LAB — VITAMIN D 25 HYDROXY (VIT D DEFICIENCY, FRACTURES): Vit D, 25-Hydroxy: 14.7 ng/mL — ABNORMAL LOW (ref 30.0–100.0)

## 2015-01-16 MED ORDER — VITAMIN D (ERGOCALCIFEROL) 1.25 MG (50000 UNIT) PO CAPS
50000.0000 [IU] | ORAL_CAPSULE | ORAL | Status: DC
  Administered 2015-01-17: 50000 [IU] via ORAL
  Filled 2015-01-16: qty 1

## 2015-01-16 MED ORDER — VITAMIN D (ERGOCALCIFEROL) 1.25 MG (50000 UNIT) PO CAPS
50000.0000 [IU] | ORAL_CAPSULE | ORAL | Status: DC
  Administered 2015-01-16: 50000 [IU] via ORAL
  Filled 2015-01-16: qty 1

## 2015-01-16 MED ORDER — SODIUM CHLORIDE 0.9 % IV SOLN
2.0000 g | Freq: Once | INTRAVENOUS | Status: AC
  Administered 2015-01-16: 2 g via INTRAVENOUS
  Filled 2015-01-16: qty 20

## 2015-01-16 MED ORDER — VITAMIN D 1000 UNITS PO TABS
1000.0000 [IU] | ORAL_TABLET | Freq: Two times a day (BID) | ORAL | Status: DC
  Administered 2015-01-16 – 2015-01-17 (×2): 1000 [IU] via ORAL
  Filled 2015-01-16 (×2): qty 1

## 2015-01-16 MED ORDER — IPRATROPIUM-ALBUTEROL 0.5-2.5 (3) MG/3ML IN SOLN: 3.0000 mL | RESPIRATORY_TRACT | Status: DC | PRN

## 2015-01-16 NOTE — Progress Notes (Addendum)
PATIENT DETAILS Name: [name] Age: 81 y.o. Sex: female Date of Birth: [birth year] Admit Date: 01/13/2015 Admitting Physician Alberteen Sam, MD RUE:AVWU,[id number], MD  Subjective:  Patient sitting in chair, denies any headache, no chest or abdominal pain no shortness of breath, does have some right-sided pelvic and hip pain upon standing up and bearing weight, can hardly take a step..  Assessment/Plan:   Right Pelvic fracture: Secondary to a mechanical fall. Continue supportive care and conservative measures. PT eval completed-recommendations are for either SNF or home health PT with a 24/7 supervision.grandson unreachable, per patient she does not have good support and help at home and she is unable to ambulate by herself, request social worker to arrange for SNF if possible.  Acute bronchitis: Continues to have cough-but lungs clear-has completed a course of Zithromax. Chest x-ray without pneumonia. Continue with supportive care, incentive spirometry and bronchodilators.   Hypomagnesemia: Suspect secondary to diuretic treatment. Replaced and stable.  Vitamin D deficiency with hypocalcemia : Placed on Vit  D and calcium supplementation. PTH is elevated secondarily due to low vitamin D and calcium levels, request PCP to repeat PTH in 4-6 weeks. TSH is stable. Recheck ionized calcium later.  Chronic systolic heart failure (EF30-35% in 2015): Clinically compensated, continue Coreg and Lasix. Allergic to ACEI (see last outpatient cardiology note on 11/17/14). Has outpatient follow-up with cardiology.  History of CAD status post CABG: Currently without any chest pain or shortness of breath. Continue aspirin, statin and Coreg.  Hypertension: Currently controlled-continue Coreg and Lasix.  GERD: Continue PPI  Chronic disease stage III: Creatinine close to usual baseline. Follow.  Anemia: Hemoglobin stable-likely secondary to chronic kidney disease. Monitor  periodically.    Antimicrobial agents  See below  Anti-infectives    Start     Dose/Rate Route Frequency Ordered Stop   01/14/15 0234  azithromycin (ZITHROMAX) tablet 250 mg     250 mg Oral See admin instructions 01/14/15 0234        DVT Prophylaxis: Prophylactic Lovenox   Code Status: Full code   Family Communication None at bedside  Procedures: Marnee Spring unreachable, no voicemail on the phone  CONSULTS:  orthopedic surgery   Disposition: Patient states that she does not have much help at home and would like SNF placement. Social worker consulted.   Time spent 30 minutes-Greater than 50% of this time was spent in counseling, explanation of diagnosis, planning of further management, and coordination of care.  MEDICATIONS: Scheduled Meds: . acetaminophen  1,000 mg Oral 3 times per day  . aspirin EC  81 mg Oral Daily  . atorvastatin  40 mg Oral Daily  . azithromycin  250 mg Oral See admin instructions  . calcium carbonate  1 tablet Oral BID WC  . carvedilol  12.5 mg Oral BID  . enoxaparin (LOVENOX) injection  30 mg Subcutaneous Q24H  . furosemide  20 mg Oral Daily  . guaiFENesin  600 mg Oral BID  . ipratropium-albuterol  3 mL Nebulization Q8H  . pantoprazole  40 mg Oral Daily  . polyethylene glycol  17 g Oral Daily  . potassium chloride SA  20 mEq Oral Daily   Continuous Infusions:  PRN Meds:.albuterol, morphine injection, ondansetron (ZOFRAN) IV, traMADol    PHYSICAL EXAM: Vital signs in last 24 hours: Filed Vitals:   01/15/15 1952 01/15/15 2108 01/16/15 0535 01/16/15 0554  BP: 138/64   146/65  Pulse: 82   69  Temp: 98.4 F (36.9 C)   98.1 F (36.7 C)  TempSrc: Oral   Oral  Resp: 18   18  Height:      Weight:      SpO2: 94% 97% 99% 98%    Weight change:  Filed Weights   01/13/15 2255 01/14/15 0244  Weight: 52.617 kg (116 lb) 52.6 kg (115 lb 15.4 oz)   Body mass index is 21.2 kg/(m^2).   Gen Exam: Awake and alert with clear  speech.  Neck: Supple, No JVD.   Chest: B/L Clear.   CVS: S1 S2 Regular, no murmurs.  Abdomen: soft, BS +, non tender, non distended.  Extremities: no edema, lower extremities warm to touch Neurologic: Non Focal.   Skin: No Rash.  Wounds: N/A.    Intake/Output from previous day:  Intake/Output Summary (Last 24 hours) at 01/16/15 1105 Last data filed at 01/15/15 1733  Gross per 24 hour  Intake    480 ml  Output      0 ml  Net    480 ml     LAB RESULTS: CBC  Recent Labs Lab 01/14/15 0026  WBC 7.9  HGB 9.5*  HCT 28.8*  PLT 201  MCV 90.0  MCH 29.7  MCHC 33.0  RDW 12.7  LYMPHSABS 1.1  MONOABS 0.5  EOSABS 0.2  BASOSABS 0.0    Chemistries   Recent Labs Lab 01/14/15 0026 01/14/15 1307 01/15/15 0726 01/16/15 0400  NA 140  --  141 139  K 3.1*  --  4.5 4.3  CL 102  --  106 105  CO2 23  --  24 24  GLUCOSE 177*  --  130* 120*  BUN 20  --  22* 17  CREATININE 1.23*  --  1.34* 1.18*  CALCIUM 7.2* 6.9* 7.3* 8.0*  MG  --  0.7* 1.2* 1.9    CBG: No results for input(s): GLUCAP in the last 168 hours.  GFR Estimated Creatinine Clearance: 28.6 mL/min (by C-G formula based on Cr of 1.18).  Coagulation profile  Recent Labs Lab 01/14/15 0026  INR 1.30    Cardiac Enzymes No results for input(s): CKMB, TROPONINI, MYOGLOBIN in the last 168 hours.  Invalid input(s): CK  Invalid input(s): POCBNP No results for input(s): DDIMER in the last 72 hours.  Recent Labs  01/14/15 1307  HGBA1C 6.1*   No results for input(s): CHOL, HDL, LDLCALC, TRIG, CHOLHDL, LDLDIRECT in the last 72 hours.  Recent Labs  01/14/15 1307  TSH 3.558   No results for input(s): VITAMINB12, FOLATE, FERRITIN, TIBC, IRON, RETICCTPCT in the last 72 hours. No results for input(s): LIPASE, AMYLASE in the last 72 hours.  Urine Studies No results for input(s): UHGB, CRYS in the last 72 hours.  Invalid input(s): UACOL, UAPR, USPG, UPH, UTP, UGL, UKET, UBIL, UNIT, UROB, ULEU, UEPI, UWBC,  URBC, UBAC, CAST, UCOM, BILUA  MICROBIOLOGY: No results found for this or any previous visit (from the past 240 hour(s)).  RADIOLOGY STUDIES/RESULTS: Dg Chest 1 View  01/14/2015  CLINICAL DATA:  Pain following fall EXAM: CHEST 1 VIEW COMPARISON:  Aug 02, 2014 FINDINGS: The interstitium remains rather prominent with questionable mild generalized interstitial edema. No airspace consolidation. There is cardiomegaly with pulmonary vascularity within normal limits. Patient is status post coronary artery bypass grafting. No adenopathy. Bones are osteoporotic. No pneumothorax. No acute fracture seen. IMPRESSION: Cardiomegaly. Suspect mild interstitial edema with a degree of chronic congestive heart failure. No airspace  consolidation. Electronically Signed   By: Bretta Bang III M.D.   On: 01/14/2015 00:11   Ct Head Wo Contrast  01/14/2015  CLINICAL DATA:  Larey Seat and hit head and neck on floor. Severe neck, base of skull pain. History of hypertension. EXAM: CT HEAD WITHOUT CONTRAST CT CERVICAL SPINE WITHOUT CONTRAST TECHNIQUE: Multidetector CT imaging of the head and cervical spine was performed following the standard protocol without intravenous contrast. Multiplanar CT image reconstructions of the cervical spine were also generated. COMPARISON:  CT CT head and angiogram of the neck November 19, 2011 FINDINGS: CT HEAD FINDINGS The ventricles and sulci are normal for age. No intraparenchymal hemorrhage, mass effect nor midline shift. Patchy supratentorial white matter hypodensities are within normal range for patient's age and though non-specific suggest sequelae of chronic small vessel ischemic disease. No acute large vascular territory infarcts. Old RIGHT caudate head lacunar infarct. No abnormal extra-axial fluid collections. Basal cisterns are patent. Severe calcific atherosclerosis of the carotid siphons. No skull fracture. Small LEFT parietal scalp hematoma without subcutaneous gas or radiopaque foreign  bodies. The included ocular globes and orbital contents are non-suspicious. Status post bilateral ocular lens implants. Atretic, chronic RIGHT maxillary sinusitis with small calcified mucosal retention cysts. The mastoid air cells are well aerated. Patient is edentulous. Severe temporomandibular osteoarthrosis. CT CERVICAL SPINE FINDINGS Cervical vertebral bodies intact. Grade 1 C4-5 anterolisthesis without spondylolysis, the C4-5 facets are fused on degenerative basis. Moderate to severe C5-6 disc height loss, uncovertebral hypertrophy and endplate spurring consistent with degenerative disc. C1-2 articulation maintained with moderate to severe arthropathy. Small amount of calcified pannus about the odontoid process. No destructive bony lesions. Partially imaged median sternotomy. Severe calcific atherosclerosis of the LEFT carotid bulb, similar to prior CT. Heterogeneous dominant 2.6 cm thyroid nodule, stable from 2013. Moderate to severe C5-6 neural foraminal narrowing. IMPRESSION: CT HEAD: Small LEFT parietal scalp hematoma.  No skull fracture. No acute intracranial process. 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 CT CERVICAL SPINE: No acute cervical spine fracture. Grade 1 C4-5 anterolisthesis on degenerative basis. Electronically Signed   By: Awilda Metro M.D.   On: 01/14/2015 00:32   Ct Cervical Spine Wo Contrast  01/14/2015  CLINICAL DATA:  Larey Seat and hit head and neck on floor. Severe neck, base of skull pain. History of hypertension. EXAM: CT HEAD WITHOUT CONTRAST CT CERVICAL SPINE WITHOUT CONTRAST TECHNIQUE: Multidetector CT imaging of the head and cervical spine was performed following the standard protocol without intravenous contrast. Multiplanar CT image reconstructions of the cervical spine were also generated. COMPARISON:  CT CT head and angiogram of the neck November 19, 2011 FINDINGS: CT HEAD FINDINGS The ventricles and sulci are  normal for age. No intraparenchymal hemorrhage, mass effect nor midline shift. Patchy supratentorial white matter hypodensities are within normal range for patient's age and though non-specific suggest sequelae of chronic small vessel ischemic disease. No acute large vascular territory infarcts. Old RIGHT caudate head lacunar infarct. No abnormal extra-axial fluid collections. Basal cisterns are patent. Severe calcific atherosclerosis of the carotid siphons. No skull fracture. Small LEFT parietal scalp hematoma without subcutaneous gas or radiopaque foreign bodies. The included ocular globes and orbital contents are non-suspicious. Status post bilateral ocular lens implants. Atretic, chronic RIGHT maxillary sinusitis with small calcified mucosal retention cysts. The mastoid air cells are well aerated. Patient is edentulous. Severe temporomandibular osteoarthrosis. CT CERVICAL SPINE FINDINGS Cervical vertebral bodies intact. Grade 1 C4-5 anterolisthesis without spondylolysis, the C4-5  facets are fused on degenerative basis. Moderate to severe C5-6 disc height loss, uncovertebral hypertrophy and endplate spurring consistent with degenerative disc. C1-2 articulation maintained with moderate to severe arthropathy. Small amount of calcified pannus about the odontoid process. No destructive bony lesions. Partially imaged median sternotomy. Severe calcific atherosclerosis of the LEFT carotid bulb, similar to prior CT. Heterogeneous dominant 2.6 cm thyroid nodule, stable from 2013. Moderate to severe C5-6 neural foraminal narrowing. IMPRESSION: CT HEAD: Small LEFT parietal scalp hematoma.  No skull fracture. No acute intracranial process. 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 CT CERVICAL SPINE: No acute cervical spine fracture. Grade 1 C4-5 anterolisthesis on degenerative basis. Electronically Signed   By: Awilda Metro M.D.   On: 01/14/2015 00:32   Dg Hip  Unilat With Pelvis 2-3 Views Right  01/14/2015  CLINICAL DATA:  Pain following fall EXAM: DG HIP (WITH OR WITHOUT PELVIS) 2-3V RIGHT COMPARISON:  None. FINDINGS: Frontal pelvis as well as frontal and lateral right hip images were obtained. There is a comminuted fracture of the medial right superior pubic ramus as well as a comminuted fracture of the medial right ischium. There is a nondisplaced fracture of the lateral right superior pubic ramus as well. No other fractures. No dislocation. There is moderate symmetric narrowing of both hip joints. Bones are somewhat osteoporotic. IMPRESSION: Comminuted fractures of the medial right superior pubic ramus and ischium. Nondisplaced fracture lateral superior pubic ramus on the right. Moderate osteoarthritic change in both hip joints. No dislocations. Bones osteoporotic. Electronically Signed   By: Bretta Bang III M.D.   On: 01/14/2015 00:09    Leroy Sea, MD  [facility] Pager:[phone]  If 7PM-7AM, please contact night-coverage [email or web address] Password [email or web address] 01/16/2015, 11:05 AM   LOS: 2 days

## 2015-01-16 NOTE — Clinical Social Work Note (Signed)
CSW received referral for SNF.  Case discussed with case manager and plan is to discharge home with home health due to patient only being in observation status.  Patient expressed she can not afford to pay privately for SNF.  CSW to sign off please re-consult if social work needs arise.  [name]. Trine Fread, MSW, [employer] [phone]

## 2015-01-16 NOTE — Care Management Note (Addendum)
Case Management Note  Patient Details  Name: [name] MRN: [id number] Date of Birth: 30-Sep-1929  Subjective/Objective:     81 yr old female admitted with  Non operative pelvic fracture, s/p fall at home.   Action/Plan: Case manager spoke with patient and her dear friend [name] concerning discharge plans. Patient has no interest in going to a SNF for rehab. Choice was offered  for [facility]. [name] stated she has a 3in1 and will borrow a walker from her church . [name] (patient's friend) said she lives near her and will be checking on her as well. Patient's grandson [name] lives with her.  12:10p Case manager spoke with Dr. Thedore Mins concerning discharge and was asked to contact patient's grandson to confirm his availability to assist patient. Case manager called [name] @[phone], He stated that he and his girlfriend can assist at night and at times during the day but they both work odd jobs. CM again discussed with patient what possibly could be arranged with friends for assistance. Patient is not able to afford private pay for SNF. Case manager will continue to monitor.   Expected Discharge Date:   01/17/15      Expected Discharge Plan:  Home w Home Health Services  In-House Referral:  Clinical Social Work  Discharge planning Services  CM Consult  Post Acute Care Choice:  Home Health Choice offered to:  Patient  DME Arranged:  N/A DME Agency:     HH Arranged:  PT HH Agency:  [facility]  Status of Service:  In process  Medicare Important Message Given:    Date Medicare IM Given:    Medicare IM give by:    Date Additional Medicare IM Given:    Additional Medicare Important Message give by:     If discussed at Long Length of Stay Meetings, dates discussed:    Additional Comments:  [name] Guthrie, RN 01/16/2015, 11:53 AM

## 2015-01-16 NOTE — Evaluation (Signed)
Occupational Therapy Evaluation Patient Details Name: [name] MRN: [id number] DOB: 06-May-1929 Today's Date: 01/16/2015    History of Present Illness [name] is an 81 y.o. female who presents after falling in her kitchen around 8:30pm last night. Patient states she was alone in her kitchen when she's not sure how she lost her balance, but ended up falling on the tile floor hitting her head and hip in the process. She was able to crawl to the phone to call her grandson and friend, who arrived and called 911. Upon presentation, patient denies any pain except when she moves her right hip in certain directions. Denies chest pain, worsened shortness of breath, nausea, vomiting or lightheadedness. States that many years ago she was diagnosed with osteoporosis and undergoes a bone scan every 2 years for monitoring. Had one trial of IV medication years ago for her osteoporosis but was unable to tolerate treatment due to GI side effects. Patient currently denies taking any medication, vitamin D or calcium for her bone health. Denies that she falls regularly, but states that she does feel unsteady when walking long distances.  Sustained right pelvic ring fracture.  Nonoperative.   Clinical Impression   Pt reports she was independent with ADLs PTA. Pt moves slowly and is in a lot of pain this afternoon. Pt is currently mod A for sit <> stand with increased time required. Recommending short term SNF for further rehab prior to return home to maximize safety and independence with ADLs and functional mobility. Pt would benefit from continued skilled OT in order to increase independence and safety with toilet and tub transfers and grooming activities in standing.     Follow Up Recommendations  SNF;Supervision/Assistance - 24 hour (if pt has 24/7 assist at home, needs HHOT)    Equipment Recommendations  3 in 1 bedside comode;Tub/shower bench    Recommendations for Other Services        Precautions / Restrictions Precautions Precautions: Fall Restrictions Weight Bearing Restrictions: Yes RLE Weight Bearing: Weight bearing as tolerated      Mobility Bed Mobility               General bed mobility comments: Pt in chair, returned to chair at end of session  Transfers Overall transfer level: Needs assistance Equipment used: Rolling walker (2 wheeled) Transfers: Sit to/from Stand Sit to Stand: Mod assist         General transfer comment: Mod A to boost up from chair. Increased time required. Good hand placement. Decreased weight shift on R LE in standing.    Balance Overall balance assessment: Needs assistance Sitting-balance support: Single extremity supported Sitting balance-Leahy Scale: Poor     Standing balance support: Bilateral upper extremity supported Standing balance-Leahy Scale: Poor Standing balance comment: RW for support                            ADL Overall ADL's : Needs assistance/impaired Eating/Feeding: Set up;Sitting   Grooming: Set up;Sitting       Lower Body Bathing: Moderate assistance;Sit to/from stand       Lower Body Dressing: Minimal assistance;Sit to/from stand Lower Body Dressing Details (indicate cue type and reason): Pt able to doff/don B socks. Min A to assist with pulling up pants in standing Toilet Transfer: Moderate assistance;Stand-pivot;BSC;RW           Functional mobility during ADLs: Moderate assistance;Rolling walker General ADL Comments: Pts close friend present for  OT session. Educated pt on use of 3 in 1 over toilet or as BSC, tub bench; pt verbalized understanding.      Vision     Perception     Praxis      Pertinent Vitals/Pain Pain Assessment: 0-10 Pain Score: 10-Worst pain ever Pain Location: R pelvis Pain Descriptors / Indicators: Aching;Sore Pain Intervention(s): Limited activity within patient's tolerance;Monitored during session;Repositioned     Hand Dominance  Right   Extremity/Trunk Assessment Upper Extremity Assessment Upper Extremity Assessment: Generalized weakness   Lower Extremity Assessment Lower Extremity Assessment: Defer to PT evaluation   Cervical / Trunk Assessment Cervical / Trunk Assessment: Kyphotic   Communication Communication Communication: No difficulties   Cognition Arousal/Alertness: Awake/alert Behavior During Therapy: WFL for tasks assessed/performed Overall Cognitive Status: Within Functional Limits for tasks assessed                     General Comments       Exercises       Shoulder Instructions      Home Living Family/patient expects to be discharged to:: Private residence Living Arrangements: Alone;Other relatives Available Help at Discharge: Family (grandson is able to provide close to 24 hours) Type of Home: House Home Access: Stairs to enter Secretary/administrator of Steps: 3   Home Layout: One level     Bathroom Shower/Tub: Tub/shower unit;Curtain Shower/tub characteristics: Engineer, building services: Standard Bathroom Accessibility: Yes How Accessible: Accessible via walker Home Equipment: None (Pt reports she has access to equipment through church)          Prior Functioning/Environment Level of Independence: Independent             OT Diagnosis: Generalized weakness;Acute pain   OT Problem List: Decreased strength;Decreased activity tolerance;Impaired balance (sitting and/or standing);Decreased safety awareness;Decreased knowledge of use of DME or AE;Decreased knowledge of precautions;Pain   OT Treatment/Interventions: Self-care/ADL training;DME and/or AE instruction;Patient/family education    OT Goals(Current goals can be found in the care plan section) Acute Rehab OT Goals Patient Stated Goal: to get better OT Goal Formulation: With patient Time For Goal Achievement: 01/30/15 Potential to Achieve Goals: Good ADL Goals Pt Will Perform Grooming: with min guard  assist;standing Pt Will Transfer to Toilet: ambulating;with min assist;bedside commode (over toilet) Pt Will Perform Toileting - Clothing Manipulation and hygiene: with min guard assist;sit to/from stand Pt Will Perform Tub/Shower Transfer: Tub transfer;with min assist;ambulating;tub bench;rolling walker  OT Frequency: Min 2X/week   Barriers to D/C: Decreased caregiver support          Co-evaluation              End of Session Equipment Utilized During Treatment: Gait belt;Rolling walker;Oxygen  Activity Tolerance: Patient limited by pain Patient left: in chair;with call bell/phone within reach;with chair alarm set;with family/visitor present   Time: 8676-1950 OT Time Calculation (min): 27 min Charges:  OT General Charges $OT Visit: 1 Procedure OT Evaluation $Initial OT Evaluation Tier I: 1 Procedure OT Treatments $Self Care/Home Management : 8-22 mins G-Codes: OT G-codes **NOT FOR INPATIENT CLASS** Functional Assessment Tool Used: Clinical judgement Functional Limitation: Self care Self Care Current Status (D3267): At least 40 percent but less than 60 percent impaired, limited or restricted Self Care Goal Status (T2458): At least 20 percent but less than 40 percent impaired, limited or restricted   Gaye Alken M.S., OTR/L Pager: [phone]  01/16/2015, 2:05 PM

## 2015-01-16 NOTE — Progress Notes (Signed)
Physical Therapy Treatment Patient Details Name: [name] MRN: [id number] DOB: May 29, 1929 Today's Date: 01/16/2015    History of Present Illness [name] is an 81 y.o. female who presents after falling in her kitchen around 8:30pm last night. Patient states she was alone in her kitchen when she's not sure how she lost her balance, but ended up falling on the tile floor hitting her head and hip in the process. She was able to crawl to the phone to call her grandson and friend, who arrived and called 911. Upon presentation, patient denies any pain except when she moves her right hip in certain directions. Denies chest pain, worsened shortness of breath, nausea, vomiting or lightheadedness. States that many years ago she was diagnosed with osteoporosis and undergoes a bone scan every 2 years for monitoring. Had one trial of IV medication years ago for her osteoporosis but was unable to tolerate treatment due to GI side effects. Patient currently denies taking any medication, vitamin D or calcium for her bone health. Denies that she falls regularly, but states that she does feel unsteady when walking long distances.  Sustained right pelvic ring fracture.  Nonoperative.    PT Comments    The pt. was able to gait train for a few more feet in therapy today.  She was very slow with movements and talked frequently throughout the session.  She also insisted on doing things "her way".  She is unsteady with ambulation and is limited by pain and not wanting to shift weight to the right leg.  The pt. reported that her grandson will not be at her home 24/7 which may be a safety issue.      Follow Up Recommendations  SNF    Equipment Recommendations  Rolling walker with 5" wheels;3in1 (PT)       Precautions / Restrictions Precautions Precautions: Fall Restrictions Weight Bearing Restrictions: Yes RLE Weight Bearing: Weight bearing as tolerated    Mobility  Bed Mobility                   Transfers Overall transfer level: Needs assistance Equipment used: Rolling walker (2 wheeled) Transfers: Sit to/from Stand Sit to Stand: Min assist;+2 safety/equipment         General transfer comment: cues to place hands on armrest of chair and to push with hands when standing; Pt. kept right leg off of floor and out in front when standing; mostly steady, min. assist for safety and balance  Ambulation/Gait Ambulation/Gait assistance: Min assist;+2 safety/equipment Ambulation Distance (Feet): 10 Feet Assistive device: Rolling walker (2 wheeled) Gait Pattern/deviations: Step-to pattern;Decreased stance time - right;Decreased stride length;Decreased weight shift to right;Shuffle;Antalgic;Trunk flexed Gait velocity: decreased (took ~20 min to walk 10 feet) Gait velocity interpretation: <1.8 ft/sec, indicative of risk for recurrent falls (pt. walks very slow and takes frequent rests/stops to talk) General Gait Details: cues on sequence with gait/walker; cues to use arms to shift weight and move right leg forward; cues to pick up feet and step and not just slide foot forward; cues for posture; LOB x2; min. assist for safety and control     Balance Overall balance assessment: Needs assistance Sitting-balance support: Bilateral upper extremity supported;Feet supported Sitting balance-Leahy Scale: Poor     Standing balance support: Bilateral upper extremity supported Standing balance-Leahy Scale: Poor Standing balance comment: relies on RW for UE support.  Cognition Arousal/Alertness: Awake/alert Behavior During Therapy: WFL for tasks assessed/performed Overall Cognitive Status: Within Functional Limits for tasks assessed                         General Comments General comments (skin integrity, edema, etc.): The pt. is a talker and is very hard to keep on task.  Ambulation was extremely slow.  She is willing to participate in therapy but  has to do it "her way".      Pertinent Vitals/Pain Pain Assessment: No/denies pain           PT Goals (current goals can now be found in the care plan section) Acute Rehab PT Goals Patient Stated Goal: to get better Progress towards PT goals: Progressing toward goals    Frequency  Min 5X/week    PT Plan Current plan remains appropriate       End of Session Equipment Utilized During Treatment: Gait belt Activity Tolerance: Patient tolerated treatment well;Patient limited by fatigue;Patient limited by pain Patient left: in chair;with call bell/phone within reach;with chair alarm set;with family/visitor present     Time: 1125-1208 PT Time Calculation (min) (ACUTE ONLY): 43 min  Charges:  3 Gait                   G CodesFonda Kinder  [phone] - Office  01/16/2015, 1:41 PM

## 2015-01-17 ENCOUNTER — Encounter (HOSPITAL_COMMUNITY): Payer: Self-pay | Admitting: Orthopedic Surgery

## 2015-01-17 DIAGNOSIS — S32511A Fracture of superior rim of right pubis, initial encounter for closed fracture: Secondary | ICD-10-CM | POA: Diagnosis present

## 2015-01-17 DIAGNOSIS — D631 Anemia in chronic kidney disease: Secondary | ICD-10-CM | POA: Diagnosis present

## 2015-01-17 DIAGNOSIS — W19XXXA Unspecified fall, initial encounter: Secondary | ICD-10-CM | POA: Diagnosis present

## 2015-01-17 DIAGNOSIS — Y92000 Kitchen of unspecified non-institutional (private) residence as  the place of occurrence of the external cause: Secondary | ICD-10-CM | POA: Diagnosis not present

## 2015-01-17 DIAGNOSIS — I2510 Atherosclerotic heart disease of native coronary artery without angina pectoris: Secondary | ICD-10-CM | POA: Diagnosis present

## 2015-01-17 DIAGNOSIS — M810 Age-related osteoporosis without current pathological fracture: Secondary | ICD-10-CM

## 2015-01-17 DIAGNOSIS — E559 Vitamin D deficiency, unspecified: Secondary | ICD-10-CM | POA: Diagnosis present

## 2015-01-17 DIAGNOSIS — M160 Bilateral primary osteoarthritis of hip: Secondary | ICD-10-CM | POA: Diagnosis present

## 2015-01-17 DIAGNOSIS — I5022 Chronic systolic (congestive) heart failure: Secondary | ICD-10-CM | POA: Diagnosis present

## 2015-01-17 DIAGNOSIS — S0003XA Contusion of scalp, initial encounter: Secondary | ICD-10-CM | POA: Diagnosis present

## 2015-01-17 DIAGNOSIS — I129 Hypertensive chronic kidney disease with stage 1 through stage 4 chronic kidney disease, or unspecified chronic kidney disease: Secondary | ICD-10-CM | POA: Diagnosis present

## 2015-01-17 DIAGNOSIS — N183 Chronic kidney disease, stage 3 (moderate): Secondary | ICD-10-CM | POA: Diagnosis present

## 2015-01-17 DIAGNOSIS — I480 Paroxysmal atrial fibrillation: Secondary | ICD-10-CM | POA: Diagnosis present

## 2015-01-17 DIAGNOSIS — I255 Ischemic cardiomyopathy: Secondary | ICD-10-CM | POA: Diagnosis present

## 2015-01-17 DIAGNOSIS — N289 Disorder of kidney and ureter, unspecified: Secondary | ICD-10-CM

## 2015-01-17 DIAGNOSIS — E876 Hypokalemia: Secondary | ICD-10-CM | POA: Diagnosis present

## 2015-01-17 DIAGNOSIS — H918X9 Other specified hearing loss, unspecified ear: Secondary | ICD-10-CM | POA: Diagnosis present

## 2015-01-17 DIAGNOSIS — J209 Acute bronchitis, unspecified: Secondary | ICD-10-CM | POA: Diagnosis present

## 2015-01-17 DIAGNOSIS — K219 Gastro-esophageal reflux disease without esophagitis: Secondary | ICD-10-CM | POA: Diagnosis present

## 2015-01-17 DIAGNOSIS — I252 Old myocardial infarction: Secondary | ICD-10-CM | POA: Diagnosis not present

## 2015-01-17 HISTORY — DX: Vitamin D deficiency, unspecified: E55.9

## 2015-01-17 HISTORY — DX: Age-related osteoporosis without current pathological fracture: M81.0

## 2015-01-17 LAB — CALCIUM, IONIZED: Calcium, Ionized, Serum: 4.6 mg/dL (ref 4.5–5.6)

## 2015-01-17 MED ORDER — ACETAMINOPHEN 325 MG PO TABS: 650.0000 mg | ORAL_TABLET | Freq: Four times a day (QID) | ORAL | Status: DC | PRN

## 2015-01-17 MED ORDER — CALCIUM CARBONATE 1250 (500 CA) MG PO TABS: 1.0000 | ORAL_TABLET | Freq: Two times a day (BID) | ORAL | Status: DC

## 2015-01-17 MED ORDER — VITAMIN D (ERGOCALCIFEROL) 1.25 MG (50000 UNIT) PO CAPS: 50000.0000 [IU] | ORAL_CAPSULE | ORAL | Status: DC

## 2015-01-17 NOTE — Care Management (Signed)
Patient states that her friend's daughter will come to transport her home at 2:30pm today. Patient will take wheelchair and rolling walker with her at that time. Case manager has not heard back from her grandson Thayer Ohm. Will continue to monitor. Vance Peper, RN Case Manager [phone]

## 2015-01-17 NOTE — Progress Notes (Cosign Needed)
[facility] Progress Note  Subjective  Doing ok  Pain with ambulation in pelvis  Apparently pelvic ring fracture not a qualifying diagnosis for SNF and pt does not really have 24 hour supervision   Pt will try to get assistance through church Also pts grandson and his girlfriend live in her house and they may be of some assistance   ROS As above   Objective   BP 153/85 mmHg  Pulse 70  Temp(Src) 98.7 F (37.1 C) (Oral)  Resp 18  Ht '5\' 2"'  (1.575 m)  Wt 52.6 kg (115 lb 15.4 oz)  BMI 21.20 kg/m2  SpO2 95%  Intake/Output      10/31 0701 - 11/01 0700 11/01 0701 - 11/02 0700   P.O. 600    Total Intake(mL/kg) 600 (11.4)    Net +600          Urine Occurrence 3 x    Stool Occurrence 1 x      Labs Results for NADENE, WITHERSPOON (MRN [id number]) as of 01/17/2015 13:31  Ref. Range 01/14/2015 13:07  Vit D, 25-Hydroxy Latest Ref Range: 30.0-100.0 ng/mL 14.7 (L)   Results for TRINIDAD, INGLE (MRN [id number]) as of 01/17/2015 13:31  Ref. Range 01/14/2015 13:07  Hemoglobin A1C Latest Ref Range: 4.8-5.6 % 6.1 (H)   Results for MAHRUKH, SEGUIN (MRN [id number]) as of 01/17/2015 13:31  Ref. Range 01/14/2015 13:07  PTH Latest Ref Range: 15-65 pg/mL 71 (H)  Results for LORIJEAN, HUSSER (MRN [id number]) as of 01/17/2015 13:31  Ref. Range 01/15/2015 07:26  PREALBUMIN Latest Ref Range: 18-38 mg/dL 14.5 (L)   Results for ANNALYSSE, SHOEMAKER (MRN [id number]) as of 01/17/2015 13:31  Ref. Range 01/16/2015 04:00  Sodium Latest Ref Range: 135-145 mmol/L 139  Potassium Latest Ref Range: 3.5-5.1 mmol/L 4.3  Chloride Latest Ref Range: 101-111 mmol/L 105  CO2 Latest Ref Range: 22-32 mmol/L 24  BUN Latest Ref Range: 6-20 mg/dL 17  Creatinine Latest Ref Range: 0.44-1.00 mg/dL 1.18 (H)  Calcium Latest Ref Range: 8.9-10.3 mg/dL 8.0 (L)  EGFR (Non-African Amer.) Latest Ref Range: >60 mL/min 41 (L)  EGFR (African American) Latest Ref Range: >60 mL/min 48 (L)  Glucose Latest Ref  Range: 65-99 mg/dL 120 (H)  Anion gap Latest Ref Range: 5-15  10  Magnesium Latest Ref Range: 1.7-2.4 mg/dL 1.9  Alkaline Phosphatase Latest Ref Range: 38-126 U/L 48  Albumin Latest Ref Range: 3.5-5.0 g/dL 2.8 (L)  AST Latest Ref Range: 15-41 U/L 19  ALT Latest Ref Range: 14-54 U/L 23  Total Protein Latest Ref Range: 6.5-8.1 g/dL 6.4 (L)  Total Bilirubin Latest Ref Range: 0.3-1.2 mg/dL 0.7    Exam  Gen: resting comfortably in chair, NAD  Pelvis: exam stable             B LEx unremarkable  Motor and sensory functions intact     B ext are warm    Assessment and Plan   POD/HD#: 75   81 y/o women s/p ground level fall   1. Stable pelvic ring fracture/ insufficiency fracture of pelvic ring               Nonoperative             Weight bearing as tolerated using walker             HHPT  Activity as tolerated   2. Pain management:             Continue current regimen  3.ABL anemia/Hemodynamics             Stable with Hgb at 9.5  4. Medical issues               Continue home meds              5. Metabolic Bone Disease:             Metabolic bone panel notable for vitamin d deficiency as well as likely poor nutritional status   Supplement vitamin D  Data suggests that obtaining 25 [place] vit d level >45 improves muscle functions and decreases risks of falls     Concerned that pt is a significant fall risk at current time and could sustain a more severe fracture in near future such as a hip fracture   Nutritional supplements if pt willing to take, such as ensure, mighty shakes, etc  6. Activity:             WBAT with walker for stabilization             HHPT/OT  7. FEN/Foley/Lines:             diet as tolerated               8. Dispo:             dc home with HHPT  Follow up with ortho in 2 weeks  Pt will need DEXA as outpt   Recheck vitamin d in 12 weeks     Jari Pigg, PA-C [facility] [phone] (O) 01/17/2015 11:15 AM

## 2015-01-17 NOTE — Care Management Note (Addendum)
Case Management Note  Patient Details  Name: [name] MRN: [id number] Date of Birth: [birth year]  Subjective/Objective:       Action/Plan:  Case manager has continued to discuss home health needs with patiient. She expresses concern about being able to get home and into her house. Case manager explained that we can arrange ambulance transport if needed. Patient will need a wheelchair and walker. She is agreeable with CM ordering both prior to discharge so she will have them when she arrives home. Patient states she cannot afford ambulance and will keep trying to reach her friend [name]. CM tried to reach Napier Field, patient's grandson but got no answer and unable to leave a message.     Expected Discharge Date:  01/17/15               Expected Discharge Plan:  Home w Home Health Services  In-House Referral:  Clinical Social Work  Discharge planning Services  CM Consult  Post Acute Care Choice:  Home Health Choice offered to:  Patient  DME Arranged:  Walker rolling, Community education officer wheelchair with seat cushion DME Agency:  [facility]  HH Arranged:  PT, RN, OT, Social Work [employer] Agency:  [facility]  Status of Service:  Completed, signed off  Medicare Important Message Given:    Date Medicare IM Given:    Medicare IM give by:    Date Additional Medicare IM Given:    Additional Medicare Important Message give by:     If discussed at Long Length of Stay Meetings, dates discussed:    Additional Comments:  Durenda Guthrie, RN 01/17/2015, 11:30 AM

## 2015-01-17 NOTE — Discharge Summary (Signed)
[name], is a 81 y.o. female  DOB [birth year]  MRN [id number].  Admission date:  01/13/2015  Admitting Physician  Alberteen Sam, MD  Discharge Date:  01/17/2015   Primary MD  Kirstie Peri, MD  Recommendations for primary care physician for things to follow:   Monitor pelvic fracture clinically, unfortunately patient did not qualify for SNF placement.  Check CBC, BMP next visit, monitor PTH, calcium and vitamin D levels closely.   Admission Diagnosis  Fall [W19.XXXA] Cervical strain, initial encounter [S16.1XXA] Closed head injury, initial encounter [S09.90XA] Closed fracture of pelvis, unspecified part of pelvis, initial encounter (HCC) [S32.9XXA]   Discharge Diagnosis  Fall [W19.XXXA] Cervical strain, initial encounter [S16.1XXA] Closed head injury, initial encounter [S09.90XA] Closed fracture of pelvis, unspecified part of pelvis, initial encounter (HCC) [S32.9XXA]    Principal Problem:   Pelvic fracture (HCC) Active Problems:   HTN (hypertension)   Ischemic cardiomyopathy   Paroxysmal atrial fibrillation (HCC)   Arteriosclerotic cardiovascular disease (ASCVD)   Anemia   Renal insufficiency   Chronic systolic heart failure (HCC)   CKD (chronic kidney disease), stage III      Past Medical History  Diagnosis Date  . Unspecified essential hypertension   . Edema   . Coronary atherosclerosis of native coronary artery     echocardiogram June 2012 ejection fraction 40% with apical and anteroseptal akinesis with scarring.  . Hemothorax on left   . Myocardial infarction of anterior wall greater than eight weeks ago     large anterior wall myocardial infarction status post emergent PCI followed by coronary bypass grafting.,  Ejection fraction 40%  . Carotid artery disease (HCC)    followed by vascular surgery  Carotid duplex in 02/2010-60-79% right proximal internal carotid artery stenosis; plaque without focal stenosis on the left; no change compared with 08/30/2009.  . Transient atrial fibrillation or flutter     post myocardial infarction and she remains in normal sinus rhythm  . Coronary artery disease     Acute anteroseptal MI treated with urgent PCI and followed by CABG surgery for left main and severe three-vessel coronary disease in 07/2010; prior percutaneous intervention in 05/2007 with DES to the proximal LAD; RCA stent in 09/2009; both stents occluded as well as M1 prior to surgery  . CHF (congestive heart failure) (HCC)   . Cataract     bilateral   . HOH (hard of hearing)   . GERD (gastroesophageal reflux disease)   . Shortness of breath     With ambulation  . Pneumonia   . Creatinine elevation   . Claustrophobia   . Arthritis     back    Past Surgical History  Procedure Laterality Date  . Tonsillectomy    . Colonoscopy  2012  . Sternal wound exploration, evacuation of left hemothorax,  and sternal wou nd closure  08/13/10    [facility]  . Cabgx 3  08/09/10    [name]  . Cardiac catheterization  2001, 2008, 2009, 2012  . Pr vein bypass  graft,aorto-fem-pop  07/29/10  . Coronary artery bypass graft  08/08/2010    Triple bypass  . Appendectomy    . Hand surgery      right hand  . Endarterectomy  12/13/2011    Procedure: ENDARTERECTOMY CAROTID;  Surgeon: Larina Earthly, MD;  Location: [facility] OR;  Service: Vascular;  Laterality: Right;  Right Carotid endartectomy with dacron patch angioplasty  . Carotid endarterectomy    . Eye surgery Bilateral 2014    cataract lens implant       HPI  from the history and physical done on the day of admission:    [name] is a 81 y.o. female with a past medical history significant for CAD with isch CM last EF30-35% in 2015, CKD stage III, and HTN who presents with fall.  The patient has been suffering from  respiratory infection for about a week, and describes feeling run down and poor oral intake this week. Tonight she was alone in the kitchen when she stood up from a chair had her foot caught and fell. She had her head and her pelvis, but was able to crawl to the phone and call 911. She did not lose consciousness, she does not feel lightheaded, dizzy, or presyncopal before the fall. She now complained of headache and groin pain.  In the ED, the patient had mild hypokalemia and was hemodynamically stable. A CT of the head and neck showed no cervical spine fracture or intracranial bleeding. X-ray of the pelvis showed a pubic ramus fracture. Chest x-ray showed prominent interstitial markings but no focal opacities. [facility] was asked to admit for pelvic ring fracture, physical therapy, and pain control.     Hospital Course:      Right Pelvic fracture: Secondary to a mechanical fall. Continue supportive care and conservative measures. PT eval completed-recommendations are for either SNF or home health PT with a 24/7 supervision.grandson unreachable, per patient she does not have good support , social work case Insurance account manager and PT were consulted. Unfortunately due to her insurance issues patient did not qualify for SNF and she did not want to pay privately for SNF placement. We will at this point discharge her with home PT, RN, health aide and social worker. She has grandson, his girlfriend and a grandchild living with her.   Acute bronchitis: Continues to have cough-but lungs clear-has completed a course of Zithromax. Chest x-ray without pneumonia. Continue with supportive care, incentive spirometry and bronchodilators.   Hypomagnesemia: Suspect secondary to diuretic treatment. Replaced and stable.  Vitamin D deficiency with hypocalcemia : Placed on Vit D and calcium supplementation. PTH is elevated secondarily due to low vitamin D and calcium levels, request PCP to repeat PTH in 4-6 weeks. TSH is stable.  Request PCP to monitor vitamin D levels, calcium levels closely.  Chronic systolic heart failure (EF30-35% in 2015): Clinically compensated, continue Coreg and Lasix. Allergic to ACEI (see last outpatient cardiology note on 11/17/14). Has outpatient follow-up with cardiology.  History of CAD status post CABG: Currently without any chest pain or shortness of breath. Continue aspirin, statin and Coreg.  Hypertension: Currently controlled-continue Coreg and Lasix.  GERD: Continue PPI  Chronic disease stage III: Creatinine close to usual baseline. Follow.  Anemia: Hemoglobin stable-likely secondary to chronic kidney disease. Monitor periodically.    Discharge Condition: Fair  Follow UP  Follow-up Information    Follow up with [facility], MD. Schedule an appointment as soon as possible for a visit in 1 week.   Specialty:  Internal Medicine   Contact information:   [address]  [place] [address] [phone]        Consults obtained - Ortho  Diet and Activity recommendation: See Discharge Instructions below  Discharge Instructions       Discharge Instructions    Discharge instructions    Complete by:  As directed   Follow with Primary MD Kirstie Peri, MD in 7 days   Get CBC, CMP, Calcium, 2 view Chest X ray checked  by Primary MD next visit.    Activity: As tolerated with Full fall precautions use walker/cane & assistance as needed   Disposition Home     Diet: Heart Healthy    For Heart failure patients - Check your Weight same time everyday, if you gain over 2 pounds, or you develop in leg swelling, experience more shortness of breath or chest pain, call your Primary MD immediately. Follow Cardiac Low Salt Diet and 1.5 lit/day fluid restriction.   On your next visit with your primary care physician please Get Medicines reviewed and adjusted.   Please request your Prim.MD to go over all Hospital Tests and Procedure/Radiological results at the follow up, please get  all Hospital records sent to your Prim MD by signing hospital release before you go home.   If you experience worsening of your admission symptoms, develop shortness of breath, life threatening emergency, suicidal or homicidal thoughts you must seek medical attention immediately by calling 911 or calling your MD immediately  if symptoms less severe.  You Must read complete instructions/literature along with all the possible adverse reactions/side effects for all the Medicines you take and that have been prescribed to you. Take any new Medicines after you have completely understood and accpet all the possible adverse reactions/side effects.   Do not drive, operating heavy machinery, perform activities at heights, swimming or participation in water activities or provide baby sitting services if your were admitted for syncope or siezures until you have seen by Primary MD or a Neurologist and advised to do so again.  Do not drive when taking Pain medications.    Do not take more than prescribed Pain, Sleep and Anxiety Medications  Special Instructions: If you have smoked or chewed Tobacco  in the last 2 yrs please stop smoking, stop any regular Alcohol  and or any Recreational drug use.  Wear Seat belts while driving.   Please note  You were cared for by a hospitalist during your hospital stay. If you have any questions about your discharge medications or the care you received while you were in the hospital after you are discharged, you can call the unit and asked to speak with the hospitalist on call if the hospitalist that took care of you is not available. Once you are discharged, your primary care physician will handle any further medical issues. Please note that NO REFILLS for any discharge medications will be authorized once you are discharged, as it is imperative that you return to your primary care physician (or establish a relationship with a primary care physician if you do not have one)  for your aftercare needs so that they can reassess your need for medications and monitor your lab values.     Increase activity slowly    Complete by:  As directed              Discharge Medications       Medication List    TAKE these medications  acetaminophen 325 MG tablet  Commonly known as:  TYLENOL  Take 2 tablets (650 mg total) by mouth every 6 (six) hours as needed for moderate pain.     aspirin EC 81 MG tablet  Take 81 mg by mouth daily.     atorvastatin 40 MG tablet  Commonly known as:  LIPITOR  Take 40 mg by mouth daily.     azithromycin 250 MG tablet  Commonly known as:  ZITHROMAX  Take 250-500 mg by mouth See admin instructions. Take 2 tablets on day 1 and 1 tablet for the next 4 days.     benzonatate 100 MG capsule  Commonly known as:  TESSALON  Take 100 mg by mouth 3 (three) times daily as needed for cough.     calcium carbonate 1250 (500 CA) MG tablet  Commonly known as:  OS-CAL - dosed in mg of elemental calcium  Take 1 tablet (500 mg of elemental calcium total) by mouth 2 (two) times daily with a meal.     carvedilol 12.5 MG tablet  Commonly known as:  COREG  Take 12.5 mg by mouth 2 (two) times daily.     furosemide 40 MG tablet  Commonly known as:  LASIX  Take 20-40 mg by mouth daily.     nitroGLYCERIN 0.4 MG SL tablet  Commonly known as:  NITROSTAT  Place 0.4 mg under the tongue every 5 (five) minutes as needed. For chest pain     pantoprazole 40 MG tablet  Commonly known as:  PROTONIX  Take 40 mg by mouth daily.     potassium chloride SA 20 MEQ tablet  Commonly known as:  K-DUR,KLOR-CON  Take 0.5 tablets (10 mEq total) by mouth daily. Dose decreased 09/16/2014.     Vitamin D (Ergocalciferol) 50000 UNITS Caps capsule  Commonly known as:  DRISDOL  Take 1 capsule (50,000 Units total) by mouth every Tuesday.        Major procedures and Radiology Reports - PLEASE review detailed and final reports for all details, in brief -        Dg Chest 1 View  01/14/2015  CLINICAL DATA:  Pain following fall EXAM: CHEST 1 VIEW COMPARISON:  Aug 02, 2014 FINDINGS: The interstitium remains rather prominent with questionable mild generalized interstitial edema. No airspace consolidation. There is cardiomegaly with pulmonary vascularity within normal limits. Patient is status post coronary artery bypass grafting. No adenopathy. Bones are osteoporotic. No pneumothorax. No acute fracture seen. IMPRESSION: Cardiomegaly. Suspect mild interstitial edema with a degree of chronic congestive heart failure. No airspace consolidation. Electronically Signed   By: Bretta Bang III M.D.   On: 01/14/2015 00:11   Ct Head Wo Contrast  01/14/2015  CLINICAL DATA:  Larey Seat and hit head and neck on floor. Severe neck, base of skull pain. History of hypertension. EXAM: CT HEAD WITHOUT CONTRAST CT CERVICAL SPINE WITHOUT CONTRAST TECHNIQUE: Multidetector CT imaging of the head and cervical spine was performed following the standard protocol without intravenous contrast. Multiplanar CT image reconstructions of the cervical spine were also generated. COMPARISON:  CT CT head and angiogram of the neck November 19, 2011 FINDINGS: CT HEAD FINDINGS The ventricles and sulci are normal for age. No intraparenchymal hemorrhage, mass effect nor midline shift. Patchy supratentorial white matter hypodensities are within normal range for patient's age and though non-specific suggest sequelae of chronic small vessel ischemic disease. No acute large vascular territory infarcts. Old RIGHT caudate head lacunar infarct. No abnormal extra-axial fluid collections. Basal cisterns  are patent. Severe calcific atherosclerosis of the carotid siphons. No skull fracture. Small LEFT parietal scalp hematoma without subcutaneous gas or radiopaque foreign bodies. The included ocular globes and orbital contents are non-suspicious. Status post bilateral ocular lens implants. Atretic, chronic RIGHT  maxillary sinusitis with small calcified mucosal retention cysts. The mastoid air cells are well aerated. Patient is edentulous. Severe temporomandibular osteoarthrosis. CT CERVICAL SPINE FINDINGS Cervical vertebral bodies intact. Grade 1 C4-5 anterolisthesis without spondylolysis, the C4-5 facets are fused on degenerative basis. Moderate to severe C5-6 disc height loss, uncovertebral hypertrophy and endplate spurring consistent with degenerative disc. C1-2 articulation maintained with moderate to severe arthropathy. Small amount of calcified pannus about the odontoid process. No destructive bony lesions. Partially imaged median sternotomy. Severe calcific atherosclerosis of the LEFT carotid bulb, similar to prior CT. Heterogeneous dominant 2.6 cm thyroid nodule, stable from 2013. Moderate to severe C5-6 neural foraminal narrowing. IMPRESSION: CT HEAD: Small LEFT parietal scalp hematoma.  No skull fracture. No acute intracranial process. 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 CT CERVICAL SPINE: No acute cervical spine fracture. Grade 1 C4-5 anterolisthesis on degenerative basis. Electronically Signed   By: Awilda Metro M.D.   On: 01/14/2015 00:32   Ct Cervical Spine Wo Contrast  01/14/2015  CLINICAL DATA:  Larey Seat and hit head and neck on floor. Severe neck, base of skull pain. History of hypertension. EXAM: CT HEAD WITHOUT CONTRAST CT CERVICAL SPINE WITHOUT CONTRAST TECHNIQUE: Multidetector CT imaging of the head and cervical spine was performed following the standard protocol without intravenous contrast. Multiplanar CT image reconstructions of the cervical spine were also generated. COMPARISON:  CT CT head and angiogram of the neck November 19, 2011 FINDINGS: CT HEAD FINDINGS The ventricles and sulci are normal for age. No intraparenchymal hemorrhage, mass effect nor midline shift. Patchy supratentorial white matter hypodensities are within normal  range for patient's age and though non-specific suggest sequelae of chronic small vessel ischemic disease. No acute large vascular territory infarcts. Old RIGHT caudate head lacunar infarct. No abnormal extra-axial fluid collections. Basal cisterns are patent. Severe calcific atherosclerosis of the carotid siphons. No skull fracture. Small LEFT parietal scalp hematoma without subcutaneous gas or radiopaque foreign bodies. The included ocular globes and orbital contents are non-suspicious. Status post bilateral ocular lens implants. Atretic, chronic RIGHT maxillary sinusitis with small calcified mucosal retention cysts. The mastoid air cells are well aerated. Patient is edentulous. Severe temporomandibular osteoarthrosis. CT CERVICAL SPINE FINDINGS Cervical vertebral bodies intact. Grade 1 C4-5 anterolisthesis without spondylolysis, the C4-5 facets are fused on degenerative basis. Moderate to severe C5-6 disc height loss, uncovertebral hypertrophy and endplate spurring consistent with degenerative disc. C1-2 articulation maintained with moderate to severe arthropathy. Small amount of calcified pannus about the odontoid process. No destructive bony lesions. Partially imaged median sternotomy. Severe calcific atherosclerosis of the LEFT carotid bulb, similar to prior CT. Heterogeneous dominant 2.6 cm thyroid nodule, stable from 2013. Moderate to severe C5-6 neural foraminal narrowing. IMPRESSION: CT HEAD: Small LEFT parietal scalp hematoma.  No skull fracture. No acute intracranial process. 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 CT CERVICAL SPINE: No acute cervical spine fracture. Grade 1 C4-5 anterolisthesis on degenerative basis. Electronically Signed   By: Awilda Metro M.D.   On: 01/14/2015 00:32   Dg Hip Unilat With Pelvis 2-3 Views Right  01/14/2015  CLINICAL DATA:  Pain following fall EXAM: DG HIP (WITH OR WITHOUT  PELVIS) 2-3V RIGHT COMPARISON:   None. FINDINGS: Frontal pelvis as well as frontal and lateral right hip images were obtained. There is a comminuted fracture of the medial right superior pubic ramus as well as a comminuted fracture of the medial right ischium. There is a nondisplaced fracture of the lateral right superior pubic ramus as well. No other fractures. No dislocation. There is moderate symmetric narrowing of both hip joints. Bones are somewhat osteoporotic. IMPRESSION: Comminuted fractures of the medial right superior pubic ramus and ischium. Nondisplaced fracture lateral superior pubic ramus on the right. Moderate osteoarthritic change in both hip joints. No dislocations. Bones osteoporotic. Electronically Signed   By: Bretta Bang III M.D.   On: 01/14/2015 00:09    Micro Results      No results found for this or any previous visit (from the past 240 hour(s)).     Today   Subjective    [facility] today has no headache,no chest abdominal pain,no new weakness tingling or numbness, feels much better.   Objective   Blood pressure 153/85, pulse 70, temperature 98.7 F (37.1 C), temperature source Oral, resp. rate 18, height 5\' 2"  (1.575 m), weight 52.6 kg (115 lb 15.4 oz), SpO2 95 %.   Intake/Output Summary (Last 24 hours) at 01/17/15 1043 Last data filed at 01/16/15 1700  Gross per 24 hour  Intake    360 ml  Output      0 ml  Net    360 ml    Exam Awake Alert, Oriented x 3, No new F.N deficits, Normal affect [place].AT,PERRAL Supple Neck,No JVD, No cervical lymphadenopathy appriciated.  Symmetrical Chest wall movement, Good air movement bilaterally, CTAB RRR,No Gallops,Rubs or new Murmurs, No Parasternal Heave +ve B.Sounds, Abd Soft, Non tender, No organomegaly appriciated, No rebound -guarding or rigidity. No Cyanosis, Clubbing or edema, No new Rash or bruise   Data Review   CBC w Diff: Lab Results  Component Value Date   WBC 7.9 01/14/2015   HGB 9.5* 01/14/2015   HCT 28.8* 01/14/2015    PLT 201 01/14/2015   LYMPHOPCT 14 01/14/2015   MONOPCT 7 01/14/2015   EOSPCT 2 01/14/2015   BASOPCT 0 01/14/2015    CMP: Lab Results  Component Value Date   NA 139 01/16/2015   K 4.3 01/16/2015   CL 105 01/16/2015   CO2 24 01/16/2015   BUN 17 01/16/2015   CREATININE 1.18* 01/16/2015   CREATININE 1.44* 10/25/2011   PROT 6.4* 01/16/2015   ALBUMIN 2.8* 01/16/2015   BILITOT 0.7 01/16/2015   ALKPHOS 48 01/16/2015   AST 19 01/16/2015   ALT 23 01/16/2015  .   Total Time in preparing paper work, data evaluation and todays exam - 35 minutes  Leroy Sea M.D on 01/17/2015 at 10:43 AM  [facility]   Office  [phone]

## 2015-01-17 NOTE — Progress Notes (Signed)
Physical Therapy Treatment Patient Details Name: [name] MRN: [id number] DOB: 1929/08/05 Today's Date: 01/17/2015    History of Present Illness [name] is an 81 y.o. female who presents after falling in her kitchen around 8:30pm last night. Patient states she was alone in her kitchen when she's not sure how she lost her balance, but ended up falling on the tile floor hitting her head and hip in the process. She was able to crawl to the phone to call her grandson and friend, who arrived and called 911. Upon presentation, patient denies any pain except when she moves her right hip in certain directions. Denies chest pain, worsened shortness of breath, nausea, vomiting or lightheadedness. States that many years ago she was diagnosed with osteoporosis and undergoes a bone scan every 2 years for monitoring. Had one trial of IV medication years ago for her osteoporosis but was unable to tolerate treatment due to GI side effects. Patient currently denies taking any medication, vitamin D or calcium for her bone health. Denies that she falls regularly, but states that she does feel unsteady when walking long distances.  Sustained right pelvic ring fracture.  Nonoperative.    PT Comments    The pt. was able to complete stair training with a walker but was not able to lift/move walker and needed assistance when ascending and descending stairs.  Her grandson now has the flu (per pt. report) so this may affect D/C plans.  She was able to complete general LE exercises and was given HEP regarding exercises.  She continues to shift weight to the left and does not want to bear weight on the right side.  She is very compliant with treatment but is hard to keep on task.     Follow Up Recommendations  SNF     Equipment Recommendations  Rolling walker with 5" wheels;3in1 (PT);Wheelchair (measurements PT)       Precautions / Restrictions Precautions Precautions: Fall Precaution Comments: HEP  given and reviewed Restrictions Weight Bearing Restrictions: Yes RLE Weight Bearing: Weight bearing as tolerated    Mobility  Bed Mobility               General bed mobility comments: Pt. in chair  Transfers Overall transfer level: Needs assistance Equipment used: Rolling walker (2 wheeled) Transfers: Sit to/from Stand Sit to Stand: Mod assist;+2 physical assistance;+2 safety/equipment         General transfer comment: Cues for hand placement during transfer.  Pt. needs increased time and mod. A for safety, control, and power up.  Pt. tends to lean to left side and keep left leg off of floor when standing   Stairs Stairs: Yes Stairs assistance: Mod assist Stair Management: No rails;Backwards;Forwards;With walker (ascended backwards, descended forwards) Number of Stairs: 1 General stair comments: Pt. very unsure of ability to ascend/descend stairs and was not able to lift/stabilize walker without assistance.  Handouts given pertaining info regarding ascending/descending stairs with a walker or WC.  Mod. A for safety and control and verbal cues for technique.      Balance Overall balance assessment: Needs assistance Sitting-balance support: Bilateral upper extremity supported;Feet supported Sitting balance-Leahy Scale: Fair     Standing balance support: Bilateral upper extremity supported;During functional activity Standing balance-Leahy Scale: Poor Standing balance comment: uses RW for support                    Cognition Arousal/Alertness: Awake/alert Behavior During Therapy: WFL for tasks assessed/performed Overall Cognitive  Status: Within Functional Limits for tasks assessed                      Exercises Total Joint Exercises Towel Squeeze: Strengthening;Both;10 reps;Seated (5 sec hold) General Exercises - Lower Extremity Ankle Circles/Pumps: AROM;Both;Seated Long Arc Quad: Right;10 reps;Seated;Strengthening Hip Flexion/Marching:  Both;Seated;10 reps;AAROM;AROM Toe Raises: AROM;Seated;Both;20 reps Heel Raises: AROM;Both;Seated;20 reps        Pertinent Vitals/Pain Pain Assessment: No/denies pain           PT Goals (current goals can now be found in the care plan section) Acute Rehab PT Goals Patient Stated Goal: to get better Progress towards PT goals: Progressing toward goals    Frequency  Min 5X/week    PT Plan Current plan remains appropriate       End of Session Equipment Utilized During Treatment: Gait belt Activity Tolerance: Patient tolerated treatment well;Patient limited by fatigue;Patient limited by pain Patient left: in chair;with call bell/phone within reach;with chair alarm set     Time: [phone] PT Time Calculation (min) (ACUTE ONLY): 36 min  Charges:  1 Gait, 1 TE                   G Codes:      Marrianne Sica 01/28/2015, 10:54 AM

## 2015-01-17 NOTE — Discharge Planning (Signed)
IV removed. Discharge instructions and prescription given to patient. Equipment sent with patient. No questions or concerns at this time. Discharged home with home health. Taken out via staff. Picked up by family friend, Chip Boer.

## 2015-01-17 NOTE — Discharge Instructions (Signed)
Follow with Primary MD [name] Peri, MD in 7 days   Get CBC, CMP, Calcium, 2 view Chest X ray checked  by Primary MD next visit.   DEXA scan for bone density    Activity: As tolerated with Full fall precautions use walker/cane & assistance as needed   Disposition Home     Diet: Heart Healthy    For Heart failure patients - Check your Weight same time everyday, if you gain over 2 pounds, or you develop in leg swelling, experience more shortness of breath or chest pain, call your Primary MD immediately. Follow Cardiac Low Salt Diet and 1.5 lit/day fluid restriction.   On your next visit with your primary care physician please Get Medicines reviewed and adjusted.   Please request your Prim.MD to go over all Hospital Tests and Procedure/Radiological results at the follow up, please get all Hospital records sent to your Prim MD by signing hospital release before you go home.   If you experience worsening of your admission symptoms, develop shortness of breath, life threatening emergency, suicidal or homicidal thoughts you must seek medical attention immediately by calling 911 or calling your MD immediately  if symptoms less severe.  You Must read complete instructions/literature along with all the possible adverse reactions/side effects for all the Medicines you take and that have been prescribed to you. Take any new Medicines after you have completely understood and accpet all the possible adverse reactions/side effects.   Do not drive, operating heavy machinery, perform activities at heights, swimming or participation in water activities or provide baby sitting services if your were admitted for syncope or siezures until you have seen by Primary MD or a Neurologist and advised to do so again.  Do not drive when taking Pain medications.    Do not take more than prescribed Pain, Sleep and Anxiety Medications  Special Instructions: If you have smoked or chewed Tobacco  in the last 2 yrs  please stop smoking, stop any regular Alcohol  and or any Recreational drug use.  Wear Seat belts while driving.   Please note  You were cared for by a hospitalist during your hospital stay. If you have any questions about your discharge medications or the care you received while you were in the hospital after you are discharged, you can call the unit and asked to speak with the hospitalist on call if the hospitalist that took care of you is not available. Once you are discharged, your primary care physician will handle any further medical issues. Please note that NO REFILLS for any discharge medications will be authorized once you are discharged, as it is imperative that you return to your primary care physician (or establish a relationship with a primary care physician if you do not have one) for your aftercare needs so that they can reassess your need for medications and monitor your lab values.

## 2015-01-19 LAB — VITAMIN D 1,25 DIHYDROXY
VITAMIN D 1, 25 (OH) TOTAL: 73 pg/mL
Vitamin D2 1, 25 (OH)2: <10 pg/mL
Vitamin D3 1, 25 (OH)2: 73 pg/mL

## 2015-01-30 ENCOUNTER — Encounter (HOSPITAL_COMMUNITY): Payer: Medicare Other

## 2015-01-30 ENCOUNTER — Ambulatory Visit: Payer: Medicare Other | Admitting: Family

## 2015-03-08 ENCOUNTER — Encounter: Payer: Self-pay | Admitting: Family

## 2015-03-14 ENCOUNTER — Ambulatory Visit (HOSPITAL_COMMUNITY)
Admission: RE | Admit: 2015-03-14 | Discharge: 2015-03-14 | Disposition: A | Discharge status: Home / Self Care | Payer: Medicare Other | Source: Ambulatory Visit | Attending: Family | Admitting: Family

## 2015-03-14 ENCOUNTER — Encounter (HOSPITAL_COMMUNITY): Payer: Medicare Other

## 2015-03-14 ENCOUNTER — Encounter: Payer: Self-pay | Admitting: Family

## 2015-03-14 ENCOUNTER — Ambulatory Visit: Payer: Medicare Other | Admitting: Family

## 2015-03-14 ENCOUNTER — Ambulatory Visit (INDEPENDENT_AMBULATORY_CARE_PROVIDER_SITE_OTHER): Payer: Medicare Other | Admitting: Family

## 2015-03-14 VITALS — BP 139/71 | HR 65 | Temp 97.4°F | Resp 16 | Ht 61.0 in | Wt 110.0 lb

## 2015-03-14 DIAGNOSIS — I6523 Occlusion and stenosis of bilateral carotid arteries: Secondary | ICD-10-CM | POA: Diagnosis not present

## 2015-03-14 DIAGNOSIS — I255 Ischemic cardiomyopathy: Secondary | ICD-10-CM | POA: Diagnosis not present

## 2015-03-14 DIAGNOSIS — Z48812 Encounter for surgical aftercare following surgery on the circulatory system: Secondary | ICD-10-CM | POA: Diagnosis present

## 2015-03-14 DIAGNOSIS — I10 Essential (primary) hypertension: Secondary | ICD-10-CM | POA: Diagnosis not present

## 2015-03-14 DIAGNOSIS — I779 Disorder of arteries and arterioles, unspecified: Secondary | ICD-10-CM | POA: Diagnosis not present

## 2015-03-14 DIAGNOSIS — Z9889 Other specified postprocedural states: Secondary | ICD-10-CM

## 2015-03-14 NOTE — Patient Instructions (Signed)
Stroke Prevention Some medical conditions and behaviors are associated with an increased chance of having a stroke. You may prevent a stroke by making healthy choices and managing medical conditions. HOW CAN I REDUCE MY RISK OF HAVING A STROKE?   Stay physically active. Get at least 30 minutes of activity on most or all days.  Do not smoke. It may also be helpful to avoid exposure to secondhand smoke.  Limit alcohol use. Moderate alcohol use is considered to be:  No more than 2 drinks per day for men.  No more than 1 drink per day for nonpregnant women.  Eat healthy foods. This involves:  Eating 5 or more servings of fruits and vegetables a day.  Making dietary changes that address high blood pressure (hypertension), high cholesterol, diabetes, or obesity.  Manage your cholesterol levels.  Making food choices that are high in fiber and low in saturated fat, trans fat, and cholesterol may control cholesterol levels.  Take any prescribed medicines to control cholesterol as directed by your health care provider.  Manage your diabetes.  Controlling your carbohydrate and sugar intake is recommended to manage diabetes.  Take any prescribed medicines to control diabetes as directed by your health care provider.  Control your hypertension.  Making food choices that are low in salt (sodium), saturated fat, trans fat, and cholesterol is recommended to manage hypertension.  Ask your health care provider if you need treatment to lower your blood pressure. Take any prescribed medicines to control hypertension as directed by your health care provider.  If you are 18-39 years of age, have your blood pressure checked every 3-5 years. If you are 40 years of age or older, have your blood pressure checked every year.  Maintain a healthy weight.  Reducing calorie intake and making food choices that are low in sodium, saturated fat, trans fat, and cholesterol are recommended to manage  weight.  Stop drug abuse.  Avoid taking birth control pills.  Talk to your health care provider about the risks of taking birth control pills if you are over 35 years old, smoke, get migraines, or have ever had a blood clot.  Get evaluated for sleep disorders (sleep apnea).  Talk to your health care provider about getting a sleep evaluation if you snore a lot or have excessive sleepiness.  Take medicines only as directed by your health care provider.  For some people, aspirin or blood thinners (anticoagulants) are helpful in reducing the risk of forming abnormal blood clots that can lead to stroke. If you have the irregular heart rhythm of atrial fibrillation, you should be on a blood thinner unless there is a good reason you cannot take them.  Understand all your medicine instructions.  Make sure that other conditions (such as anemia or atherosclerosis) are addressed. SEEK IMMEDIATE MEDICAL CARE IF:   You have sudden weakness or numbness of the face, arm, or leg, especially on one side of the body.  Your face or eyelid droops to one side.  You have sudden confusion.  You have trouble speaking (aphasia) or understanding.  You have sudden trouble seeing in one or both eyes.  You have sudden trouble walking.  You have dizziness.  You have a loss of balance or coordination.  You have a sudden, severe headache with no known cause.  You have new chest pain or an irregular heartbeat. Any of these symptoms may represent a serious problem that is an emergency. Do not wait to see if the symptoms will   go away. Get medical help at once. Call your local emergency services (911 in [place] Do not drive yourself to the hospital.   This information is not intended to replace advice given to you by your health care provider. Make sure you discuss any questions you have with your health care provider.   Document Released: 04/11/2004 Document Revised: 03/25/2014 Document Reviewed:  09/04/2012 Elsevier Interactive Patient Education 2016 [employer] Inc[employer]  

## 2015-03-14 NOTE — Progress Notes (Signed)
Chief Complaint: Extracranial Carotid Artery Stenosis   History of Present Illness  [name] is a 81 y.o. female patient of Dr. Arbie Cookey who is status post right carotid endarterectomy September 2013. She returns today for follow up. Patient states that she never had a stroke or TIA symptoms, specifically the patient denies a history of amaurosis fugax or monocular blindness, denies a history unilateral of facial drooping, denies a history of hemiplegia, and denies a history of receptive or expressive aphasia.  She had an MI in 2012 at [facility], [place], [place] while she was in the hospital for a colonoscopy; she was then transferred to [facility] where she had a 3 vessel CABG. Patient denies claudication symptoms, denies non-healing wounds.  Her son died 2013/03/09 of DM and CRF complications. Her daughter died 2013/11/07 of metastatic cancer. She fell In October 2016, fractured her pelvis, was hospitalized, no surgery. She is receiving home physical therapy.   Patient denies New Medical or Surgical History.  Pt Diabetic: No Pt smoker: former smoker, quit in the 1980's  Pt meds include: Statin : Yes Betablocker: Yes ASA: Yes Other anticoagulants/antiplatelets: no    Past Medical History  Diagnosis Date  . Unspecified essential hypertension   . Edema   . Coronary atherosclerosis of native coronary artery     echocardiogram June 2012 ejection fraction 40% with apical and anteroseptal akinesis with scarring.  . Hemothorax on left   . Myocardial infarction of anterior wall greater than eight weeks ago     large anterior wall myocardial infarction status post emergent PCI followed by coronary bypass grafting.,  Ejection fraction 40%  . Carotid artery disease (HCC)     followed by vascular surgery  Carotid duplex in 02/2010-60-79% right proximal internal carotid artery stenosis; plaque without focal stenosis on the left; no change compared with 08/30/2009.  .  Transient atrial fibrillation or flutter     post myocardial infarction and she remains in normal sinus rhythm  . Coronary artery disease     Acute anteroseptal MI treated with urgent PCI and followed by CABG surgery for left main and severe three-vessel coronary disease in 07/2010; prior percutaneous intervention in 05/2007 with DES to the proximal LAD; RCA stent in 09/2009; both stents occluded as well as M1 prior to surgery  . CHF (congestive heart failure) (HCC)   . Cataract     bilateral   . HOH (hard of hearing)   . GERD (gastroesophageal reflux disease)   . Shortness of breath     With ambulation  . Pneumonia   . Creatinine elevation   . Claustrophobia   . Arthritis     back  . Vitamin D deficiency 01/17/2015  . Osteoporosis 01/17/2015  . Fall at home Oct. 28, 2016    Fx Right Hip    Social History Social History  Substance Use Topics  . Smoking status: Never Smoker   . Smokeless tobacco: Never Used  . Alcohol Use: No    Family History Family History  Problem Relation Age of Onset  . Cancer Other   . Coronary artery disease Other   . Heart failure Father     chf  . Heart disease Father   . Hyperlipidemia Mother   . Hypertension Mother   . Aneurysm Mother     AAA  . Heart disease Mother   . Hyperlipidemia Sister   . Hypertension Sister   . Heart disease Sister     Heart  Disease before age 20  . Heart attack Sister   . Hyperlipidemia Son   . Hypertension Son   . Heart disease Son   . Peripheral vascular disease Son     AMPUTATION  . Diabetes Son     Amputation Left foot    Surgical History Past Surgical History  Procedure Laterality Date  . Tonsillectomy    . Colonoscopy  2012  . Sternal wound exploration, evacuation of left hemothorax,  and sternal wou nd closure  08/13/10    [facility]  . Cabgx 3  08/09/10    [name]  . Cardiac catheterization  2001, 2008, 2009, 2012  . Pr vein bypass graft,aorto-fem-pop  07/29/10  . Coronary artery bypass graft   08/08/2010    Triple bypass  . Appendectomy    . Hand surgery      right hand  . Endarterectomy  12/13/2011    Procedure: ENDARTERECTOMY CAROTID;  Surgeon: Larina Earthly, MD;  Location: [facility] OR;  Service: Vascular;  Laterality: Right;  Right Carotid endartectomy with dacron patch angioplasty  . Eye surgery Bilateral 2014    cataract lens implant  . Carotid endarterectomy Right 12-13-11    CEA  . Fracture surgery Right 01-13-15    Hip Fx - pt fell in home    Allergies  Allergen Reactions  . Lisinopril Cough  . Sulfonamide Derivatives Rash and Other (See Comments)    Severe Headache    Current Outpatient Prescriptions  Medication Sig Dispense Refill  . aspirin EC 81 MG tablet Take 81 mg by mouth daily.    Marland Kitchen atorvastatin (LIPITOR) 40 MG tablet Take 40 mg by mouth daily.     . calcium carbonate (OS-CAL - DOSED IN MG OF ELEMENTAL CALCIUM) 1250 (500 CA) MG tablet Take 1 tablet (500 mg of elemental calcium total) by mouth 2 (two) times daily with a meal. 30 tablet 0  . carvedilol (COREG) 12.5 MG tablet Take 12.5 mg by mouth 2 (two) times daily.    . furosemide (LASIX) 40 MG tablet Take 20-40 mg by mouth daily.     . nitroGLYCERIN (NITROSTAT) 0.4 MG SL tablet Place 0.4 mg under the tongue every 5 (five) minutes as needed. For chest pain    . pantoprazole (PROTONIX) 40 MG tablet Take 40 mg by mouth daily.      . potassium chloride SA (K-DUR,KLOR-CON) 20 MEQ tablet Take 0.5 tablets (10 mEq total) by mouth daily. Dose decreased 09/16/2014.    . Vitamin D, Ergocalciferol, (DRISDOL) 50000 UNITS CAPS capsule Take 1 capsule (50,000 Units total) by mouth every Tuesday. 10 capsule 0  . acetaminophen (TYLENOL) 325 MG tablet Take 2 tablets (650 mg total) by mouth every 6 (six) hours as needed for moderate pain. (Patient not taking: Reported on 03/14/2015) 20 tablet 0  . azithromycin (ZITHROMAX) 250 MG tablet Take 250-500 mg by mouth See admin instructions. Reported on 03/14/2015    . benzonatate (TESSALON)  100 MG capsule Take 100 mg by mouth 3 (three) times daily as needed for cough. Reported on 03/14/2015     No current facility-administered medications for this visit.    Review of Systems : See HPI for pertinent positives and negatives.  Physical Examination  Filed Vitals:   03/14/15 1138 03/14/15 1141  BP: 139/71 139/71  Pulse: 64 65  Temp:  97.4 F (36.3 C)  TempSrc:  Oral  Resp:  16  Height:   (1.549 m)  Weight:  110 lb (49.896 kg)  SpO2:  99%   Body mass index is 20.8 kg/(m^2).  General: WDWN female in NAD GAIT: slow, deliberate, using cane Eyes: PERRLA Pulmonary: CTAB, no rales,  rhonchi, or wheezing.  Cardiac: regular rhythm, no detected murmur.  VASCULAR EXAM Carotid Bruits Left Right   Negative Negative   Aorta is not palpable. Radial pulses are 2+ palpable and equal.      LE Pulses LEFT RIGHT   POPLITEAL not palpable  not palpable   POSTERIOR TIBIAL  palpable   palpable    DORSALIS PEDIS  ANTERIOR TIBIAL palpable   palpable     Gastrointestinal: soft, nontender, BS WNL, no r/g,no palpable masses.  Musculoskeletal: No muscle atrophy/wasting. M/S 3/5 throughout, Extremities without ischemic changes. Moderate kyphosis.  Neurologic: A&O X 3; Appropriate Affect,  Speech is normal CN 2-12 intact except hard of hearing, Pain and light touch intact in extremities, Motor exam as listed above.           Non-Invasive Vascular Imaging CAROTID DUPLEX 03/14/2015   CEREBROVASCULAR DUPLEX EVALUATION    INDICATION: Carotid artery disease     PREVIOUS INTERVENTION(S): Right carotid endarterectomy 12/13/2011    DUPLEX EXAM:     RIGHT  LEFT  Peak Systolic Velocities (cm/s) End Diastolic Velocities (cm/s) Plaque LOCATION Peak Systolic Velocities (cm/s) End  Diastolic Velocities (cm/s) Plaque  89 11  CCA PROXIMAL 108 17   72 10  CCA MID 77 16   76 11  CCA DISTAL 64 12   87 0 HT ECA 119 0   76 18  ICA PROXIMAL 61 13 HT  97 23  ICA MID 80 21   100 29  ICA DISTAL 92 25     NA ICA / CCA Ratio (PSV) 1.19  Antegrade  Vertebral Flow Antegrade    Brachial Systolic Pressure (mmHg)   Multiphasic (Subclavian artery) Brachial Artery Waveforms Multiphasic (Subclavian artery)    Plaque Morphology:  HM = Homogeneous, HT = Heterogeneous, CP = Calcific Plaque, SP = Smooth Plaque, IP = Irregular Plaque     ADDITIONAL FINDINGS:     IMPRESSION: Patent right carotid endarterectomy site with no evidence of hyperplasia or restenosis.  Left internal carotid artery velocities suggest a <40% stenosis.      Compared to the previous exam:  No significant change in comparison to the last exam on 01/25/2014.      Assessment: [name] is a 81 y.o. female who is status post right carotid endarterectomy September 2013. She has no hx of stroke or TIA.  Today's carotid duplex suggests a patent right carotid endarterectomy site with no evidence of hyperplasia or restenosis and minimal left ICA stenosis.  No significant change in comparison to the last exam on 01/25/2014.    Plan: Follow-up in 1 year with Carotid Duplex scan.   I discussed in depth with the patient the nature of atherosclerosis, and emphasized the importance of maximal medical management including strict control of blood pressure, blood glucose, and lipid levels, obtaining regular exercise, and continued cessation of smoking.  The patient is aware that without maximal medical management the underlying atherosclerotic disease process will progress, limiting the benefit of any interventions. The patient was given information about stroke prevention and what symptoms should prompt the patient to seek immediate medical care. Thank you for allowing [place] to participate in this patient's  care.  Charisse March, RN, MSN, FNP-C [facility] Office: [phone]  Clinic Physician: Hart Rochester  03/14/2015 11:54 AM

## 2015-04-07 ENCOUNTER — Encounter: Payer: Self-pay | Admitting: *Deleted

## 2015-04-07 ENCOUNTER — Ambulatory Visit (INDEPENDENT_AMBULATORY_CARE_PROVIDER_SITE_OTHER): Payer: Medicare Other | Admitting: Cardiovascular Disease

## 2015-04-07 ENCOUNTER — Encounter: Payer: Self-pay | Admitting: Cardiovascular Disease

## 2015-04-07 VITALS — BP 138/70 | HR 79 | Ht 62.0 in | Wt 115.0 lb

## 2015-04-07 DIAGNOSIS — E785 Hyperlipidemia, unspecified: Secondary | ICD-10-CM | POA: Diagnosis not present

## 2015-04-07 DIAGNOSIS — I10 Essential (primary) hypertension: Secondary | ICD-10-CM

## 2015-04-07 DIAGNOSIS — I25812 Atherosclerosis of bypass graft of coronary artery of transplanted heart without angina pectoris: Secondary | ICD-10-CM

## 2015-04-07 DIAGNOSIS — I6523 Occlusion and stenosis of bilateral carotid arteries: Secondary | ICD-10-CM

## 2015-04-07 DIAGNOSIS — I519 Heart disease, unspecified: Secondary | ICD-10-CM

## 2015-04-07 DIAGNOSIS — I5022 Chronic systolic (congestive) heart failure: Secondary | ICD-10-CM | POA: Diagnosis not present

## 2015-04-07 NOTE — Patient Instructions (Signed)
Your physician recommends that you continue on your current medications as directed. Please refer to the Current Medication list given to you today. Your physician recommends that you schedule a follow-up appointment in: 6 months. You will receive a reminder letter in the mail in about 4 months reminding you to call and schedule your appointment. If you don't receive this letter, please contact our office. 

## 2015-04-07 NOTE — Progress Notes (Signed)
Patient ID: [name], female   DOB: [birth year], 82 y.o.   MRN: [id number]      SUBJECTIVE: The patient returns for routine follow-up. She has a history of coronary artery disease and CABG (May 2012) with chronic systolic heart failure, as well as peripheral vascular disease and is status post right carotid endarterectomy, along with hypertension and stage III chronic kidney disease.  Carotid Dopplers on 03/14/15 showed patent right carotid endarterectomy and less than 40% left internal carotid artery stenosis.  The most recent echocardiogram I find is dated 04/12/13 which showed an EF of 30-35% with mild LV dilatation, diastolic dysfunction (grade I?), moderate mitral regurgitation, and severely elevated pulmonary pressures (65-75 mmHg), interpreted by Dr. Johny Shears.   She was hospitalized for a pelvic fracture in October 2016. Denies chest pain, leg swelling, and shortness of breath.    Review of Systems: As per "subjective", otherwise negative.  Allergies  Allergen Reactions  . Lisinopril Cough  . Sulfonamide Derivatives Rash and Other (See Comments)    Severe Headache    Current Outpatient Prescriptions  Medication Sig Dispense Refill  . acetaminophen (TYLENOL) 325 MG tablet Take 2 tablets (650 mg total) by mouth every 6 (six) hours as needed for moderate pain. 20 tablet 0  . aspirin EC 81 MG tablet Take 81 mg by mouth daily.    Marland Kitchen atorvastatin (LIPITOR) 40 MG tablet Take 40 mg by mouth daily.     . calcium carbonate (OS-CAL - DOSED IN MG OF ELEMENTAL CALCIUM) 1250 (500 CA) MG tablet Take 1 tablet (500 mg of elemental calcium total) by mouth 2 (two) times daily with a meal. 30 tablet 0  . carvedilol (COREG) 12.5 MG tablet Take 12.5 mg by mouth 2 (two) times daily.    . furosemide (LASIX) 40 MG tablet Take 20-40 mg by mouth daily.     . nitroGLYCERIN (NITROSTAT) 0.4 MG SL tablet Place 0.4 mg under the tongue every 5 (five) minutes as needed. For chest pain    .  pantoprazole (PROTONIX) 40 MG tablet Take 40 mg by mouth daily.      . potassium chloride SA (K-DUR,KLOR-CON) 20 MEQ tablet Take 0.5 tablets (10 mEq total) by mouth daily. Dose decreased 09/16/2014.    . Vitamin D, Ergocalciferol, (DRISDOL) 50000 UNITS CAPS capsule Take 1 capsule (50,000 Units total) by mouth every Tuesday. 10 capsule 0   No current facility-administered medications for this visit.    Past Medical History  Diagnosis Date  . Unspecified essential hypertension   . Edema   . Coronary atherosclerosis of native coronary artery     echocardiogram June 2012 ejection fraction 40% with apical and anteroseptal akinesis with scarring.  . Hemothorax on left   . Myocardial infarction of anterior wall greater than eight weeks ago     large anterior wall myocardial infarction status post emergent PCI followed by coronary bypass grafting.,  Ejection fraction 40%  . Carotid artery disease (HCC)     followed by vascular surgery  Carotid duplex in 02/2010-60-79% right proximal internal carotid artery stenosis; plaque without focal stenosis on the left; no change compared with 08/30/2009.  . Transient atrial fibrillation or flutter     post myocardial infarction and she remains in normal sinus rhythm  . Coronary artery disease     Acute anteroseptal MI treated with urgent PCI and followed by CABG surgery for left main and severe three-vessel coronary disease in 07/2010; prior percutaneous intervention in 05/2007 with DES to  the proximal LAD; RCA stent in 09/2009; both stents occluded as well as M1 prior to surgery  . CHF (congestive heart failure) (HCC)   . Cataract     bilateral   . HOH (hard of hearing)   . GERD (gastroesophageal reflux disease)   . Shortness of breath     With ambulation  . Pneumonia   . Creatinine elevation   . Claustrophobia   . Arthritis     back  . Vitamin D deficiency 01/17/2015  . Osteoporosis 01/17/2015  . Fall at home Oct. 28, 2016    Fx Right Hip    Past  Surgical History  Procedure Laterality Date  . Tonsillectomy    . Colonoscopy  2012  . Sternal wound exploration, evacuation of left hemothorax,  and sternal wou nd closure  08/13/10    Hendrickson  . Cabgx 3  08/09/10    [name]  . Cardiac catheterization  2001, 2008, 2009, 2012  . Pr vein bypass graft,aorto-fem-pop  07/29/10  . Coronary artery bypass graft  08/08/2010    Triple bypass  . Appendectomy    . Hand surgery      right hand  . Endarterectomy  12/13/2011    Procedure: ENDARTERECTOMY CAROTID;  Surgeon: Larina Earthly, MD;  Location: [facility] OR;  Service: Vascular;  Laterality: Right;  Right Carotid endartectomy with dacron patch angioplasty  . Eye surgery Bilateral 2014    cataract lens implant  . Carotid endarterectomy Right 12-13-11    CEA  . Fracture surgery Right 01-13-15    Hip Fx - pt fell in home    Social History   Social History  . Marital Status: Widowed    Spouse Name: N/A  . Number of Children: N/A  . Years of Education: N/A   Occupational History  . Not on file.   Social History Main Topics  . Smoking status: Never Smoker   . Smokeless tobacco: Never Used  . Alcohol Use: No  . Drug Use: No  . Sexual Activity: Not on file   Other Topics Concern  . Not on file   Social History Narrative   Regular exercise- yes      Filed Vitals:   04/07/15 1419  BP: 138/70  Pulse: 79  Height: 5\' 2"  (1.575 m)  Weight: 115 lb (52.164 kg)  SpO2: 99%    PHYSICAL EXAM General: NAD HEENT: Normal. Neck: No JVD, no thyromegaly. Lungs: Clear b/l. CV: Regular rate and rhythm, normal S1/S2, no S3/S4, soft 1/6 systolic murmur along left sternal border. No pretibial nor periankle edema.  Abdomen: Soft, nontender, no distention.  Neurologic: Alert and oriented.  Psych: Normal affect. Skin: Normal. Musculoskeletal: Kyphotic.  ECG: Most recent ECG reviewed.      ASSESSMENT AND PLAN: 1. CAD s/p CABG: Symptomatically stable. Continue ASA, Lipitor, and Coreg. EF  30-35%. Developed cough with lisinopril and reported same with ARB in past.  2. Ischemic cardiomyopathy with chronic systolic heart failure: EF 30-35%. Euvolemic and stable. Continue Lasix 40 mg daily with 20 meq KCl due to mild hyperkalemia in June.   On Coreg. Developed cough with lisinopril and reported same with ARB in past. Did not get AICD. May consider having her follow up with Dr. Ladona Ridgel, but echo will need to be repeated.  3. PVD and s/p right carotid endarterectomy: Last Duplex in 02/2015 showed patent right CEA with <40% LICA stenosis. Follows with vascular surgery.  4. Essential HTN: Well controlled on Coreg. No changes.  Dispo: f/u 6 months.   Kate Sable, M.D., F.A.C.C.

## 2015-04-20 DIAGNOSIS — E2839 Other primary ovarian failure: Secondary | ICD-10-CM | POA: Diagnosis not present

## 2015-10-05 ENCOUNTER — Ambulatory Visit (INDEPENDENT_AMBULATORY_CARE_PROVIDER_SITE_OTHER): Payer: Medicare Other | Admitting: Cardiovascular Disease

## 2015-10-05 ENCOUNTER — Encounter: Payer: Self-pay | Admitting: Cardiovascular Disease

## 2015-10-05 VITALS — BP 120/66 | HR 60 | Ht 62.0 in | Wt 117.0 lb

## 2015-10-05 DIAGNOSIS — I6523 Occlusion and stenosis of bilateral carotid arteries: Secondary | ICD-10-CM

## 2015-10-05 DIAGNOSIS — E785 Hyperlipidemia, unspecified: Secondary | ICD-10-CM | POA: Diagnosis not present

## 2015-10-05 DIAGNOSIS — I5022 Chronic systolic (congestive) heart failure: Secondary | ICD-10-CM

## 2015-10-05 DIAGNOSIS — I5189 Other ill-defined heart diseases: Secondary | ICD-10-CM

## 2015-10-05 DIAGNOSIS — I25812 Atherosclerosis of bypass graft of coronary artery of transplanted heart without angina pectoris: Secondary | ICD-10-CM | POA: Diagnosis not present

## 2015-10-05 DIAGNOSIS — I10 Essential (primary) hypertension: Secondary | ICD-10-CM

## 2015-10-05 DIAGNOSIS — I519 Heart disease, unspecified: Secondary | ICD-10-CM

## 2015-10-05 MED ORDER — POTASSIUM CHLORIDE CRYS ER 20 MEQ PO TBCR: 20.0000 meq | EXTENDED_RELEASE_TABLET | Freq: Every day | ORAL | Status: DC

## 2015-10-05 NOTE — Progress Notes (Signed)
Patient ID: [name], female   DOB: 1929-09-07, 82 y.o.   MRN: [id number]      SUBJECTIVE: The patient returns for routine follow-up. She has a history of coronary artery disease and CABG (May 2012) with chronic systolic heart failure, as well as peripheral vascular disease and is status post right carotid endarterectomy, along with hypertension and stage III chronic kidney disease.  Carotid Dopplers on 03/14/15 showed patent right carotid endarterectomy and less than 40% left internal carotid artery stenosis.  The most recent echocardiogram I find is dated 04/12/13 which showed an EF of 30-35% with mild LV dilatation, diastolic dysfunction (grade I?), moderate mitral regurgitation, and severely elevated pulmonary pressures (65-75 mmHg), interpreted by Dr. Johny Shears.   Denies chest pain, leg swelling, and shortness of breath. 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  ECG performed in the office today which I personally interpreted demonstrated sinus rhythm with a right bundle branch block.   Review of Systems: As per "subjective", otherwise negative.  Allergies  Allergen Reactions  . Lisinopril Cough  . Sulfonamide Derivatives Rash and Other (See Comments)    Severe Headache    Current Outpatient Prescriptions  Medication Sig Dispense Refill  . acetaminophen (TYLENOL) 325 MG tablet Take 2 tablets (650 mg total) by mouth every 6 (six) hours as needed for moderate pain. 20 tablet 0  . aspirin EC 81 MG tablet Take 81 mg by mouth daily.    Marland Kitchen atorvastatin (LIPITOR) 40 MG tablet Take 40 mg by mouth daily.     . calcium carbonate (OSCAL) 1500 (600 Ca) MG TABS tablet Take 1 tablet by mouth 2 (two) times daily with a meal.    . carvedilol (COREG) 12.5 MG tablet Take 12.5 mg by mouth 2 (two) times daily.    . furosemide (LASIX) 40 MG tablet Take 20-40 mg by mouth daily.     . nitroGLYCERIN  (NITROSTAT) 0.4 MG SL tablet Place 0.4 mg under the tongue every 5 (five) minutes as needed. For chest pain    . pantoprazole (PROTONIX) 40 MG tablet Take 40 mg by mouth daily.      . potassium chloride SA (K-DUR,KLOR-CON) 20 MEQ tablet Take 20 mEq by mouth daily.     No current facility-administered medications for this visit.    Past Medical History  Diagnosis Date  . Unspecified essential hypertension   . Edema   . Coronary atherosclerosis of native coronary artery     echocardiogram June 2012 ejection fraction 40% with apical and anteroseptal akinesis with scarring.  . Hemothorax on left   . Myocardial infarction of anterior wall greater than eight weeks ago     large anterior wall myocardial infarction status post emergent PCI followed by coronary bypass grafting.,  Ejection fraction 40%  . Carotid artery disease (HCC)     followed by vascular surgery  Carotid duplex in 02/2010-60-79% right proximal internal carotid artery stenosis; plaque without focal stenosis on the left; no change compared with 08/30/2009.  . Transient atrial fibrillation or flutter     post myocardial infarction and she remains in normal sinus rhythm  . Coronary artery disease     Acute anteroseptal MI treated with urgent PCI and followed by CABG surgery for left main and severe three-vessel coronary disease in 07/2010; prior percutaneous intervention in 05/2007 with DES to the proximal LAD; RCA stent in 09/2009; both stents occluded as well as  M1 prior to surgery  . CHF (congestive heart failure) (HCC)   . Cataract     bilateral   . HOH (hard of hearing)   . GERD (gastroesophageal reflux disease)   . Shortness of breath     With ambulation  . Pneumonia   . Creatinine elevation   . Claustrophobia   . Arthritis     back  . Vitamin D deficiency 01/17/2015  . Osteoporosis 01/17/2015  . Fall at home Oct. 28, 2016    Fx Right Hip    Past Surgical History  Procedure Laterality Date  . Tonsillectomy    .  Colonoscopy  2012  . Sternal wound exploration, evacuation of left hemothorax,  and sternal wou nd closure  08/13/10    Hendrickson  . Cabgx 3  08/09/10    [name]  . Cardiac catheterization  2001, 2008, 2009, 2012  . Pr vein bypass graft,aorto-fem-pop  07/29/10  . Coronary artery bypass graft  08/08/2010    Triple bypass  . Appendectomy    . Hand surgery      right hand  . Endarterectomy  12/13/2011    Procedure: ENDARTERECTOMY CAROTID;  Surgeon: Larina Earthly, MD;  Location: [facility] OR;  Service: Vascular;  Laterality: Right;  Right Carotid endartectomy with dacron patch angioplasty  . Eye surgery Bilateral 2014    cataract lens implant  . Carotid endarterectomy Right 12-13-11    CEA  . Fracture surgery Right 01-13-15    Hip Fx - pt fell in home    Social History   Social History  . Marital Status: Widowed    Spouse Name: N/A  . Number of Children: N/A  . Years of Education: N/A   Occupational History  . Not on file.   Social History Main Topics  . Smoking status: Never Smoker   . Smokeless tobacco: Never Used  . Alcohol Use: No  . Drug Use: No  . Sexual Activity: Not on file   Other Topics Concern  . Not on file   Social History Narrative   Regular exercise- yes      Filed Vitals:   10/05/15 1023  BP: 120/66  Pulse: 60  Height: 5\' 2"  (1.575 m)  Weight: 117 lb (53.071 kg)  SpO2: 97%    PHYSICAL EXAM General: NAD HEENT: Normal. Neck: No JVD, no thyromegaly. Lungs: Clear b/l. CV: Regular rate and rhythm, normal S1/S2, no S3/S4, soft 1/6 systolic murmur along left sternal border. No pretibial nor periankle edema.  Abdomen: Soft, nontender, no distention.  Neurologic: Alert and oriented.  Psych: Normal affect. Skin: Normal. Musculoskeletal: Kyphotic.    ECG: Most recent ECG reviewed.      ASSESSMENT AND PLAN: 1. CAD s/p CABG: Symptomatically stable. Continue ASA, Lipitor, and Coreg. EF 30-35%. Developed cough with lisinopril and reported same with ARB  in past.  2. Ischemic cardiomyopathy with chronic systolic heart failure: EF 30-35%. Euvolemic and stable. Continue Lasix 40 mg daily with 20 meq KCl.  On Coreg. Developed cough with lisinopril and reported same with ARB in past. Did not get AICD. May consider having her follow up with Dr. Ladona Ridgel, but echo will need to be repeated.  3. PVD and s/p right carotid endarterectomy: Last Duplex in 02/2015 showed patent right CEA with <40% LICA stenosis. Follows with vascular surgery.  4. Essential HTN: Well controlled on Coreg. No changes.   Dispo: f/u 6 months.   Prentice Docker, M.D., F.A.C.C.

## 2015-10-05 NOTE — Patient Instructions (Addendum)
Medication Instructions:   Potassium refilled, sent to [facility] today.  Continue all other medications.    Labwork: NONE  Testing/Procedures: NONE  Follow-Up: Your physician wants you to follow up in: 6 months.  You will receive a reminder letter in the mail one-two months in advance.  If you don't receive a letter, please call our office to schedule the follow up appointment   Any Other Special Instructions Will Be Listed Below (If Applicable).  If you need a refill on your cardiac medications before your next appointment, please call your pharmacy.

## 2016-01-18 DIAGNOSIS — Z23 Encounter for immunization: Secondary | ICD-10-CM | POA: Diagnosis not present

## 2016-03-08 ENCOUNTER — Encounter: Payer: Self-pay | Admitting: Family

## 2016-03-13 ENCOUNTER — Ambulatory Visit: Payer: Medicare Other | Admitting: Family

## 2016-03-13 ENCOUNTER — Encounter (HOSPITAL_COMMUNITY): Payer: Medicare Other

## 2016-03-21 ENCOUNTER — Other Ambulatory Visit: Payer: Self-pay | Admitting: *Deleted

## 2016-03-21 DIAGNOSIS — I6523 Occlusion and stenosis of bilateral carotid arteries: Secondary | ICD-10-CM

## 2016-03-21 DIAGNOSIS — Z48812 Encounter for surgical aftercare following surgery on the circulatory system: Secondary | ICD-10-CM

## 2016-03-26 ENCOUNTER — Ambulatory Visit: Payer: Medicare Other | Admitting: Family

## 2016-03-26 ENCOUNTER — Ambulatory Visit (HOSPITAL_COMMUNITY): Payer: Medicare Other

## 2016-04-05 ENCOUNTER — Encounter: Payer: Self-pay | Admitting: *Deleted

## 2016-04-08 ENCOUNTER — Ambulatory Visit (INDEPENDENT_AMBULATORY_CARE_PROVIDER_SITE_OTHER): Payer: Medicare Other | Admitting: Cardiovascular Disease

## 2016-04-08 ENCOUNTER — Encounter: Payer: Self-pay | Admitting: Cardiovascular Disease

## 2016-04-08 VITALS — BP 167/97 | HR 69 | Ht 62.0 in | Wt 114.2 lb

## 2016-04-08 DIAGNOSIS — I10 Essential (primary) hypertension: Secondary | ICD-10-CM

## 2016-04-08 DIAGNOSIS — I5022 Chronic systolic (congestive) heart failure: Secondary | ICD-10-CM | POA: Diagnosis not present

## 2016-04-08 DIAGNOSIS — I6523 Occlusion and stenosis of bilateral carotid arteries: Secondary | ICD-10-CM | POA: Diagnosis not present

## 2016-04-08 DIAGNOSIS — E785 Hyperlipidemia, unspecified: Secondary | ICD-10-CM | POA: Diagnosis not present

## 2016-04-08 DIAGNOSIS — I25812 Atherosclerosis of bypass graft of coronary artery of transplanted heart without angina pectoris: Secondary | ICD-10-CM

## 2016-04-08 DIAGNOSIS — I519 Heart disease, unspecified: Secondary | ICD-10-CM

## 2016-04-08 NOTE — Progress Notes (Signed)
SUBJECTIVE: The patient returns for routine follow-up. She has a history of coronary artery disease and CABG (May 2012) with chronic systolic heart failure, as well as peripheral vascular disease and is status post right carotid endarterectomy, along with hypertension and stage III chronic kidney disease.  Carotid Dopplers on 03/14/15 showed patent right carotid endarterectomy and less than 40% left internal carotid artery stenosis.  The most recent echocardiogram I find is dated 04/12/13 which showed an EF of 30-35% with mild LV dilatation, diastolic dysfunction (grade I?), moderate mitral regurgitation, and severely elevated pulmonary pressures (65-75 mmHg), interpreted by Dr. Johny Shears.   Denies chest pain, leg swelling, and shortness of breath. Has had to use one nitroglycerin tablets since her last visit with me.  She was agitated this morning and thinks that's why her blood pressure is elevated.   Review of Systems: As per "subjective", otherwise negative.  Allergies  Allergen Reactions  . Lisinopril Cough  . Sulfonamide Derivatives Rash and Other (See Comments)    Severe Headache    Current Outpatient Prescriptions  Medication Sig Dispense Refill  . acetaminophen (TYLENOL) 325 MG tablet Take 2 tablets (650 mg total) by mouth every 6 (six) hours as needed for moderate pain. 20 tablet 0  . aspirin EC 81 MG tablet Take 81 mg by mouth daily.    Marland Kitchen atorvastatin (LIPITOR) 40 MG tablet Take 40 mg by mouth daily.     . calcium carbonate (OSCAL) 1500 (600 Ca) MG TABS tablet Take 1 tablet by mouth 2 (two) times daily with a meal.    . carvedilol (COREG) 12.5 MG tablet Take 12.5 mg by mouth 2 (two) times daily.    . furosemide (LASIX) 40 MG tablet Take 20-40 mg by mouth daily.     . nitroGLYCERIN (NITROSTAT) 0.4 MG SL tablet Place 0.4 mg under the tongue every 5 (five) minutes as needed. For chest pain    . pantoprazole (PROTONIX) 40 MG tablet Take 40 mg by mouth daily.        . potassium chloride SA (K-DUR,KLOR-CON) 20 MEQ tablet Take 1 tablet (20 mEq total) by mouth daily. 30 tablet 6   No current facility-administered medications for this visit.     Past Medical History:  Diagnosis Date  . Arthritis    back  . Carotid artery disease (HCC)    followed by vascular surgery  Carotid duplex in 02/2010-60-79% right proximal internal carotid artery stenosis; plaque without focal stenosis on the left; no change compared with 08/30/2009.  . Cataract    bilateral   . CHF (congestive heart failure) (HCC)   . Claustrophobia   . Coronary artery disease    Acute anteroseptal MI treated with urgent PCI and followed by CABG surgery for left main and severe three-vessel coronary disease in 07/2010; prior percutaneous intervention in 05/2007 with DES to the proximal LAD; RCA stent in 09/2009; both stents occluded as well as M1 prior to surgery  . Coronary atherosclerosis of native coronary artery    echocardiogram June 2012 ejection fraction 40% with apical and anteroseptal akinesis with scarring.  . Creatinine elevation   . Edema   . Fall at home Oct. 28, 2016   Fx Right Hip  . GERD (gastroesophageal reflux disease)   . Hemothorax on left   . HOH (hard of hearing)   . Myocardial infarction of anterior wall greater than eight weeks ago    large anterior wall myocardial infarction status post emergent PCI  followed by coronary bypass grafting.,  Ejection fraction 40%  . Osteoporosis 01/17/2015  . Pneumonia   . Shortness of breath    With ambulation  . Transient atrial fibrillation or flutter    post myocardial infarction and she remains in normal sinus rhythm  . Unspecified essential hypertension   . Vitamin D deficiency 01/17/2015    Past Surgical History:  Procedure Laterality Date  . APPENDECTOMY    . CABGx 3  08/09/10   [name]  . CARDIAC CATHETERIZATION  2001, 2008, 2009, 2012  . CAROTID ENDARTERECTOMY Right 12-13-11   CEA  . COLONOSCOPY  2012  . CORONARY ARTERY  BYPASS GRAFT  08/08/2010   Triple bypass  . ENDARTERECTOMY  12/13/2011   Procedure: ENDARTERECTOMY CAROTID;  Surgeon: Larina Earthly, MD;  Location: [facility] OR;  Service: Vascular;  Laterality: Right;  Right Carotid endartectomy with dacron patch angioplasty  . EYE SURGERY Bilateral 2014   cataract lens implant  . FRACTURE SURGERY Right 01-13-15   Hip Fx - pt fell in home  . HAND SURGERY     right hand  . PR VEIN BYPASS GRAFT,AORTO-FEM-POP  07/29/10  . Sternal wound exploration, evacuation of left hemothorax,  and sternal wou nd closure  08/13/10   Hendrickson  . TONSILLECTOMY      Social History   Social History  . Marital status: Widowed    Spouse name: N/A  . Number of children: N/A  . Years of education: N/A   Occupational History  . Not on file.   Social History Main Topics  . Smoking status: Never Smoker  . Smokeless tobacco: Never Used  . Alcohol use No  . Drug use: No  . Sexual activity: Not on file   Other Topics Concern  . Not on file   Social History Narrative   Regular exercise- yes      Vitals:   04/08/16 1000  BP: (!) 167/97  Pulse: 69  SpO2: 95%  Weight: 114 lb 3.2 oz (51.8 kg)  Height: 5\' 2"  (1.575 m)    PHYSICAL EXAM General: NAD HEENT: Normal. Neck: No JVD, no thyromegaly. Lungs: Clear b/l. CV: Regular rate and rhythm, normal S1/S2, no S3/S4, soft 1/6 systolic murmur along left sternal border. No pretibial nor periankle edema.  Abdomen: Soft, nontender, no distention.  Neurologic: Alert and oriented.  Psych: Normal affect. Skin: Normal. Musculoskeletal: Kyphotic.    ECG: Most recent ECG reviewed.      ASSESSMENT AND PLAN: 1. CAD s/p CABG: Symptomatically stable. Continue ASA, Lipitor, and Coreg. EF 30-35%. Developed cough with lisinopril and reported same with ARB in past.  2. Ischemic cardiomyopathy with chronic systolic heart failure: EF 30-35%. Euvolemic and stable. Continue Lasix 40 mg daily with 20 meq KCl.  On Coreg.  Developed cough with lisinopril and reported same with ARB in past. Did not get AICD. May consider having her follow up with Dr. Ladona Ridgel, but echo will need to be repeated. I will order an echocardiogram.  3. PVD and s/p right carotid endarterectomy: Last Duplex in 02/2015 showed patent right CEA with <40% LICA stenosis. Follows with vascular surgery.  4. Essential HTN: Elevated today on Coreg, normal at last visit. She was agitated this morning and thinks that's why her blood pressure is elevated.Will monitor.   Dispo: f/u 6 months.   Prentice Docker, M.D., F.A.C.C.

## 2016-04-08 NOTE — Patient Instructions (Signed)

## 2016-05-02 ENCOUNTER — Ambulatory Visit (INDEPENDENT_AMBULATORY_CARE_PROVIDER_SITE_OTHER): Payer: Medicare Other

## 2016-05-02 ENCOUNTER — Other Ambulatory Visit: Payer: Self-pay

## 2016-05-02 ENCOUNTER — Telehealth: Payer: Self-pay | Admitting: *Deleted

## 2016-05-02 DIAGNOSIS — I5022 Chronic systolic (congestive) heart failure: Secondary | ICD-10-CM | POA: Diagnosis not present

## 2016-05-02 LAB — ECHOCARDIOGRAM COMPLETE
E decel time: 240 msec
EERAT: 16.76
FS: 26 % — AB (ref 28–44)
IVS/LV PW RATIO, ED: 0.77
LA diam end sys: 38 mm
LA vol index: 48.1 mL/m2
LADIAMINDEX: 2.52 cm/m2
LASIZE: 38 mm
LAVOL: 72.4 mL
LAVOLA4C: 79.8 mL
LDCA: 2.27 cm2
LV E/e' medial: 16.76
LV PW d: 12.7 mm — AB (ref 0.6–1.1)
LV TDI E'LATERAL: 7.04
LV sys vol index: 37 mL/m2
LVDIAVOL: 92 mL (ref 46–106)
LVDIAVOLIN: 61 mL/m2
LVEEAVG: 16.76
LVELAT: 7.04 cm/s
LVOT SV: 38 mL
LVOT VTI: 16.6 cm
LVOT diameter: 17 mm
LVOT peak vel: 69.5 cm/s
LVSYSVOL: 56 mL — AB (ref 14–42)
MV Dec: 240
MV pk E vel: 118 m/s
MVPG: 6 mmHg
MVPKAVEL: 67.4 m/s
PISA EROA: 0.03 cm2
RV LATERAL S' VELOCITY: 5.64 cm/s
RV TAPSE: 14.2 mm
RV sys press: 82 mmHg
Reg peak vel: 444 cm/s
Simpson's disk: 40
Stroke v: 37 ml
TDI e' medial: 4.56
TR max vel: 444 cm/s
VTI: 190 cm

## 2016-05-02 NOTE — Telephone Encounter (Signed)
Notes Recorded by Lesle Chris, LPN on 06/06/2246 at 4:57 PM EST Left message to return call.  ------  Notes Recorded by Laqueta Linden, MD on 05/02/2016 at 12:35 PM EST Severely reduced pumping function.

## 2016-05-07 ENCOUNTER — Encounter: Payer: Self-pay | Admitting: *Deleted

## 2016-05-07 NOTE — Telephone Encounter (Signed)
Mailed letter today as there is message in demographics that she does not have phone number.

## 2016-05-14 ENCOUNTER — Encounter: Payer: Self-pay | Admitting: Family

## 2016-05-21 ENCOUNTER — Encounter: Payer: Self-pay | Admitting: Family

## 2016-05-21 ENCOUNTER — Ambulatory Visit (HOSPITAL_COMMUNITY)
Admission: RE | Admit: 2016-05-21 | Discharge: 2016-05-21 | Disposition: A | Discharge status: Home / Self Care | Payer: Medicare Other | Source: Ambulatory Visit | Attending: Surgery | Admitting: Surgery

## 2016-05-21 ENCOUNTER — Ambulatory Visit (INDEPENDENT_AMBULATORY_CARE_PROVIDER_SITE_OTHER): Payer: Medicare Other | Admitting: Family

## 2016-05-21 VITALS — BP 152/73 | HR 73 | Temp 98.2°F | Resp 18 | Ht 62.0 in | Wt 115.0 lb

## 2016-05-21 DIAGNOSIS — I6522 Occlusion and stenosis of left carotid artery: Secondary | ICD-10-CM | POA: Diagnosis not present

## 2016-05-21 DIAGNOSIS — Z48812 Encounter for surgical aftercare following surgery on the circulatory system: Secondary | ICD-10-CM | POA: Diagnosis not present

## 2016-05-21 DIAGNOSIS — I6523 Occlusion and stenosis of bilateral carotid arteries: Secondary | ICD-10-CM | POA: Diagnosis not present

## 2016-05-21 DIAGNOSIS — Z87891 Personal history of nicotine dependence: Secondary | ICD-10-CM | POA: Diagnosis not present

## 2016-05-21 DIAGNOSIS — Z9889 Other specified postprocedural states: Secondary | ICD-10-CM | POA: Insufficient documentation

## 2016-05-21 LAB — VAS US CAROTID
LCCAPDIAS: 8 cm/s
LEFT ECA DIAS: -3 cm/s
LEFT VERTEBRAL DIAS: -9 cm/s
LICADSYS: -88 cm/s
LICAPSYS: -80 cm/s
Left CCA dist dias: 8 cm/s
Left CCA dist sys: 60 cm/s
Left CCA prox sys: 97 cm/s
Left ICA dist dias: -16 cm/s
Left ICA prox dias: -16 cm/s
RCCAPSYS: 72 cm/s
RIGHT CCA MID DIAS: 9 cm/s
RIGHT ECA DIAS: -1 cm/s
RIGHT VERTEBRAL DIAS: -11 cm/s
Right CCA prox dias: 8 cm/s
Right cca dist sys: -80 cm/s

## 2016-05-21 NOTE — Patient Instructions (Signed)
Stroke Prevention Some medical conditions and behaviors are associated with an increased chance of having a stroke. You may prevent a stroke by making healthy choices and managing medical conditions. How can I reduce my risk of having a stroke?  Stay physically active. Get at least 30 minutes of activity on most or all days.  Do not smoke. It may also be helpful to avoid exposure to secondhand smoke.  Limit alcohol use. Moderate alcohol use is considered to be:  No more than 2 drinks per day for men.  No more than 1 drink per day for nonpregnant women.  Eat healthy foods. This involves:  Eating 5 or more servings of fruits and vegetables a day.  Making dietary changes that address high blood pressure (hypertension), high cholesterol, diabetes, or obesity.  Manage your cholesterol levels.  Making food choices that are high in fiber and low in saturated fat, trans fat, and cholesterol may control cholesterol levels.  Take any prescribed medicines to control cholesterol as directed by your health care provider.  Manage your diabetes.  Controlling your carbohydrate and sugar intake is recommended to manage diabetes.  Take any prescribed medicines to control diabetes as directed by your health care provider.  Control your hypertension.  Making food choices that are low in salt (sodium), saturated fat, trans fat, and cholesterol is recommended to manage hypertension.  Ask your health care provider if you need treatment to lower your blood pressure. Take any prescribed medicines to control hypertension as directed by your health care provider.  If you are 18-39 years of age, have your blood pressure checked every 3-5 years. If you are 40 years of age or older, have your blood pressure checked every year.  Maintain a healthy weight.  Reducing calorie intake and making food choices that are low in sodium, saturated fat, trans fat, and cholesterol are recommended to manage  weight.  Stop drug abuse.  Avoid taking birth control pills.  Talk to your health care provider about the risks of taking birth control pills if you are over 35 years old, smoke, get migraines, or have ever had a blood clot.  Get evaluated for sleep disorders (sleep apnea).  Talk to your health care provider about getting a sleep evaluation if you snore a lot or have excessive sleepiness.  Take medicines only as directed by your health care provider.  For some people, aspirin or blood thinners (anticoagulants) are helpful in reducing the risk of forming abnormal blood clots that can lead to stroke. If you have the irregular heart rhythm of atrial fibrillation, you should be on a blood thinner unless there is a good reason you cannot take them.  Understand all your medicine instructions.  Make sure that other conditions (such as anemia or atherosclerosis) are addressed. Get help right away if:  You have sudden weakness or numbness of the face, arm, or leg, especially on one side of the body.  Your face or eyelid droops to one side.  You have sudden confusion.  You have trouble speaking (aphasia) or understanding.  You have sudden trouble seeing in one or both eyes.  You have sudden trouble walking.  You have dizziness.  You have a loss of balance or coordination.  You have a sudden, severe headache with no known cause.  You have new chest pain or an irregular heartbeat. Any of these symptoms may represent a serious problem that is an emergency. Do not wait to see if the symptoms will go away.   Get medical help at once. Call your local emergency services (911 in [place] Do not drive yourself to the hospital. This information is not intended to replace advice given to you by your health care provider. Make sure you discuss any questions you have with your health care provider. Document Released: 04/11/2004 Document Revised: 08/10/2015 Document Reviewed: 09/04/2012 Elsevier  Interactive Patient Education  2017 Elsevier Inc.      Preventing Cerebrovascular Disease Arteries are blood vessels that carry blood that contains oxygen from the heart to all parts of the body. Cerebrovascular disease affects arteries that supply the brain. Any condition that blocks or disrupts blood flow to the brain can cause cerebrovascular disease. Brain cells that lose blood supply start to die within minutes (stroke). Stroke is the main danger of cerebrovascular disease. Atherosclerosis and high blood pressure are common causes of cerebrovascular disease. Atherosclerosis is narrowing and hardening of an artery that results when fat, cholesterol, calcium, or other substances (plaque) build up inside an artery. Plaque reduces blood flow through the artery. High blood pressure increases the risk of bleeding inside the brain. Making diet and lifestyle changes to prevent atherosclerosis and high blood pressure lowers your risk of cerebrovascular disease. What nutrition changes can be made?  Eat more fruits, vegetables, and whole grains.  Reduce how much saturated fat you eat. To do this, eat less red meat and fewer full-fat dairy products.  Eat healthy proteins instead of red meat. Healthy proteins include:  Fish. Eat fish that contains heart-healthy omega-3 fatty acids, twice a week. Examples include salmon, albacore tuna, mackerel, and herring.  Chicken.  Nuts.  Low-fat or nonfat yogurt.  Avoid processed meats, like bacon and lunchmeat.  Avoid foods that contain:  A lot of sugar, such as sweets and drinks with added sugar.  A lot of salt (sodium). Avoid adding extra salt to your food, as told by your health care provider.  Trans fats, such as margarine and baked goods. Trans fats may be listed as "partially hydrogenated oils" on food labels.  Check food labels to see how much sodium, sugar, and trans fats are in foods.  Use vegetable oils that contain low amounts of  saturated fat, such as olive oil or canola oil. What lifestyle changes can be made?  Drink alcohol in moderation. This means no more than 1 drink a day for nonpregnant women and 2 drinks a day for men. One drink equals 12 oz of beer, 5 oz of wine, or 1 oz of hard liquor.  If you are overweight, ask your health care provider to recommend a weight-loss plan for you. Losing 5-10 lb (2.2-4.5 kg) can reduce your risk of diabetes, atherosclerosis, and high blood pressure.  Exercise for 30?60 minutes on most days, or as much as told by your health care provider.  Do moderate-intensity exercise, such as brisk walking, bicycling, and water aerobics. Ask your health care provider which activities are safe for you.  Do not use any products that contain nicotine or tobacco, such as cigarettes and e-cigarettes. If you need help quitting, ask your health care provider. Why are these changes important? Making these changes lowers your risk of many diseases that can cause cerebrovascular disease and stroke. Stroke is a leading cause of death and disability. Making these changes also improves your overall health and quality of life. What can I do to lower my risk? The following factors make you more likely to develop cerebrovascular disease:  Being overweight.  Smoking.  Being physically   inactive.  Eating a high-fat diet.  Having certain health conditions, such as:  Diabetes.  High blood pressure.  Heart disease.  Atherosclerosis.  High cholesterol.  Sickle cell disease. Talk with your health care provider about your risk for cerebrovascular disease. Work with your health care provider to control diseases that you have that may contribute to cerebrovascular disease. Your health care provider may prescribe medicines to help prevent major causes of cerebrovascular disease. Where to find more information: Learn more about preventing cerebrovascular disease from:  National Heart, Lung, and  Blood Institute: www.nhlbi.nih.gov/health/health-topics/topics/stroke  Centers for Disease Control and Prevention: cdc.gov/stroke/about.htm Summary  Cerebrovascular disease can lead to a stroke.  Atherosclerosis and high blood pressure are major causes of cerebrovascular disease.  Making diet and lifestyle changes can reduce your risk of cerebrovascular disease.  Work with your health care provider to get your risk factors under control to reduce your risk of cerebrovascular disease. This information is not intended to replace advice given to you by your health care provider. Make sure you discuss any questions you have with your health care provider. Document Released: 03/19/2015 Document Revised: 09/22/2015 Document Reviewed: 03/19/2015 Elsevier Interactive Patient Education  2017 Elsevier Inc.  

## 2016-05-21 NOTE — Progress Notes (Signed)
Chief Complaint: Follow up Extracranial Carotid Artery Stenosis   History of Present Illness  [name] is a 83 y.o. female patient of Dr. Arbie Cookey who is status post right carotid endarterectomy September 2013. She returns today for follow up. Patient states that she never had a stroke or TIA symptoms, specifically the patient denies a history of amaurosis fugax or monocular blindness, denies a history unilateral of facial drooping, denies a history of hemiplegia, and denies a history of receptive or expressive aphasia.  She had an MI in 2012 at [facility], [place], [place] while she was in the hospital for a colonoscopy; she was then transferred to [facility] where she had a  3 vessel CABG. Patient denies claudication symptoms, denies non-healing wounds.  Her son died 03-07-13 of DM and CRF complications. Her daughter died 11/05/13 of metastatic cancer. She fell In October 2016, fractured her pelvis, was hospitalized, no surgery. She received home physical therapy.   Pt Diabetic: No Pt smoker: former smoker, quit in the 1980's  Pt meds include: Statin : Yes Betablocker: Yes ASA: Yes Other anticoagulants/antiplatelets: no    Past Medical History:  Diagnosis Date  . Arthritis    back  . Carotid artery disease (HCC)    followed by vascular surgery  Carotid duplex in 02/2010-60-79% right proximal internal carotid artery stenosis; plaque without focal stenosis on the left; no change compared with 08/30/2009.  . Cataract    bilateral   . CHF (congestive heart failure) (HCC)   . Claustrophobia   . Coronary artery disease    Acute anteroseptal MI treated with urgent PCI and followed by CABG surgery for left main and severe three-vessel coronary disease in 07/2010; prior percutaneous intervention in 05/2007 with DES to the proximal LAD; RCA stent in 09/2009; both stents occluded as well as M1 prior to surgery  . Coronary atherosclerosis of native coronary  artery    echocardiogram June 2012 ejection fraction 40% with apical and anteroseptal akinesis with scarring.  . Creatinine elevation   . Edema   . Fall at home Oct. 28, 2016   Fx Right Hip  . GERD (gastroesophageal reflux disease)   . Hemothorax on left   . HOH (hard of hearing)   . Myocardial infarction of anterior wall greater than eight weeks ago    large anterior wall myocardial infarction status post emergent PCI followed by coronary bypass grafting.,  Ejection fraction 40%  . Osteoporosis 01/17/2015  . Pneumonia   . Shortness of breath    With ambulation  . Transient atrial fibrillation or flutter    post myocardial infarction and she remains in normal sinus rhythm  . Unspecified essential hypertension   . Vitamin D deficiency 01/17/2015    Social History Social History  Substance Use Topics  . Smoking status: Never Smoker  . Smokeless tobacco: Never Used  . Alcohol use No    Family History Family History  Problem Relation Age of Onset  . Heart failure Father     chf  . Heart disease Father   . Hyperlipidemia Mother   . Hypertension Mother   . Aneurysm Mother     AAA  . Heart disease Mother   . Hyperlipidemia Son   . Hypertension Son   . Heart disease Son   . Peripheral vascular disease Son     AMPUTATION  . Diabetes Son     Amputation Left foot  . Cancer Other   . Coronary artery disease  Other   . Hyperlipidemia Sister   . Hypertension Sister   . Heart disease Sister     Heart Disease before age 13  . Heart attack Sister     Surgical History Past Surgical History:  Procedure Laterality Date  . APPENDECTOMY    . CABGx 3  08/09/10   [name]  . CARDIAC CATHETERIZATION  2001, 2008, 2009, 2012  . CAROTID ENDARTERECTOMY Right 12-13-11   CEA  . COLONOSCOPY  2012  . CORONARY ARTERY BYPASS GRAFT  08/08/2010   Triple bypass  . ENDARTERECTOMY  12/13/2011   Procedure: ENDARTERECTOMY CAROTID;  Surgeon: Larina Earthly, MD;  Location: [facility] OR;  Service: Vascular;   Laterality: Right;  Right Carotid endartectomy with dacron patch angioplasty  . EYE SURGERY Bilateral 2014   cataract lens implant  . FRACTURE SURGERY Right 01-13-15   Hip Fx - pt fell in home  . HAND SURGERY     right hand  . PR VEIN BYPASS GRAFT,AORTO-FEM-POP  07/29/10  . Sternal wound exploration, evacuation of left hemothorax,  and sternal wou nd closure  08/13/10   Hendrickson  . TONSILLECTOMY      Allergies  Allergen Reactions  . Lisinopril Cough  . Sulfonamide Derivatives Rash and Other (See Comments)    Severe Headache    Current Outpatient Prescriptions  Medication Sig Dispense Refill  . acetaminophen (TYLENOL) 325 MG tablet Take 2 tablets (650 mg total) by mouth every 6 (six) hours as needed for moderate pain. 20 tablet 0  . aspirin EC 81 MG tablet Take 81 mg by mouth daily.    [name] Kitchen atorvastatin (LIPITOR) 40 MG tablet Take 40 mg by mouth daily.     . calcium carbonate (OSCAL) 1500 (600 Ca) MG TABS tablet Take 1 tablet by mouth 2 (two) times daily with a meal.    . carvedilol (COREG) 12.5 MG tablet Take 12.5 mg by mouth 2 (two) times daily.    . furosemide (LASIX) 40 MG tablet Take 20-40 mg by mouth daily.     . nitroGLYCERIN (NITROSTAT) 0.4 MG SL tablet Place 0.4 mg under the tongue every 5 (five) minutes as needed. For chest pain    . pantoprazole (PROTONIX) 40 MG tablet Take 40 mg by mouth daily.      . potassium chloride SA (K-DUR,KLOR-CON) 20 MEQ tablet Take 1 tablet (20 mEq total) by mouth daily. 30 tablet 6   No current facility-administered medications for this visit.     Review of Systems : See HPI for pertinent positives and negatives.  Physical Examination  Vitals:   05/21/16 1447 05/21/16 1451  BP: 140/71 (!) 152/73  Pulse: 65 73  Resp: 18   Temp: 98.2 F (36.8 C)   TempSrc: Oral   SpO2: 93%   Weight: 115 lb (52.2 kg)   Height: 5\' 2"  (1.575 m)    Body mass index is 21.03 kg/m.  General: WDWN female in NAD GAIT: slow, deliberate, using cane Eyes:  PERRLA Pulmonary: Respirations are non labored, CTAB, no rales,  rhonchi, or wheezing.  Cardiac: regular rhythm, no detected murmur.  VASCULAR EXAM Carotid Bruits Left Right   Negative Negative   Aorta is not palpable. Radial pulses are 2+ palpable and equal.      LE Pulses LEFT RIGHT   POPLITEAL not palpable  not palpable   POSTERIOR TIBIAL  palpable   palpable    DORSALIS PEDIS  ANTERIOR TIBIAL palpable   palpable     Gastrointestinal: soft, nontender,  BS WNL, no r/g,no palpable masses.  Musculoskeletal: No muscle atrophy/wasting. M/S 3/5 throughout, Extremities without ischemic changes. Moderate kyphosis.  Neurologic: A&O X 3; Appropriate Affect,  Speech is normal CN 2-12 intact except hard of hearing, Pain and light touch intact in extremities, Motor exam as listed above    Assessment: AURORA RODY is a 83 y.o. female who is status post right carotid endarterectomy September 2013. She has no hx of stroke or TIA.     DATA Today's carotid duplex suggests a patent right carotid endarterectomy site with no evidence of hyperplasia or restenosis, and <40% left ICA stenosis.  Bilateral vertebral artery flow is antegrade.  Bilateral subclavian artery waveforms are normal.  No significant change in comparison to the last exam on 03-13-15.    Plan: Follow-up in 1 year with Carotid Duplex scan.    I discussed in depth with the patient the nature of atherosclerosis, and emphasized the importance of maximal medical management including strict control of blood pressure, blood glucose, and lipid levels, obtaining regular exercise, and continued cessation of smoking.  The patient is aware that without maximal medical management the underlying atherosclerotic disease process  will progress, limiting the benefit of any interventions. The patient was given information about stroke prevention and what symptoms should prompt the patient to seek immediate medical care. Thank you for allowing [place] to participate in this patient's care.   Charisse March, RN, MSN, FNP-C [facility] Office: [phone]  Clinic Physician: Early  05/21/16 2:56 PM

## 2016-05-28 ENCOUNTER — Other Ambulatory Visit: Payer: Self-pay

## 2016-05-28 NOTE — Addendum Note (Signed)
Addended by: Burton Apley A on: 05/28/2016 12:23 PM   Modules accepted: Orders

## 2016-07-18 DIAGNOSIS — I2510 Atherosclerotic heart disease of native coronary artery without angina pectoris: Secondary | ICD-10-CM | POA: Diagnosis not present

## 2016-07-18 DIAGNOSIS — Z789 Other specified health status: Secondary | ICD-10-CM | POA: Diagnosis not present

## 2016-07-18 DIAGNOSIS — I509 Heart failure, unspecified: Secondary | ICD-10-CM | POA: Diagnosis not present

## 2016-07-18 DIAGNOSIS — K210 Gastro-esophageal reflux disease with esophagitis: Secondary | ICD-10-CM | POA: Diagnosis not present

## 2016-07-18 DIAGNOSIS — E7800 Pure hypercholesterolemia, unspecified: Secondary | ICD-10-CM | POA: Diagnosis not present

## 2016-07-18 DIAGNOSIS — I10 Essential (primary) hypertension: Secondary | ICD-10-CM | POA: Diagnosis not present

## 2016-07-18 DIAGNOSIS — I255 Ischemic cardiomyopathy: Secondary | ICD-10-CM | POA: Diagnosis not present

## 2016-07-18 DIAGNOSIS — N183 Chronic kidney disease, stage 3 (moderate): Secondary | ICD-10-CM | POA: Diagnosis not present

## 2016-07-18 DIAGNOSIS — I219 Acute myocardial infarction, unspecified: Secondary | ICD-10-CM | POA: Diagnosis not present

## 2016-07-18 DIAGNOSIS — Z299 Encounter for prophylactic measures, unspecified: Secondary | ICD-10-CM | POA: Diagnosis not present

## 2016-07-18 DIAGNOSIS — I6529 Occlusion and stenosis of unspecified carotid artery: Secondary | ICD-10-CM | POA: Diagnosis not present

## 2016-07-18 DIAGNOSIS — Z6822 Body mass index (BMI) 22.0-22.9, adult: Secondary | ICD-10-CM | POA: Diagnosis not present

## 2016-08-29 DIAGNOSIS — E559 Vitamin D deficiency, unspecified: Secondary | ICD-10-CM | POA: Diagnosis not present

## 2016-08-29 DIAGNOSIS — Z1389 Encounter for screening for other disorder: Secondary | ICD-10-CM | POA: Diagnosis not present

## 2016-08-29 DIAGNOSIS — Z6822 Body mass index (BMI) 22.0-22.9, adult: Secondary | ICD-10-CM | POA: Diagnosis not present

## 2016-08-29 DIAGNOSIS — Z7189 Other specified counseling: Secondary | ICD-10-CM | POA: Diagnosis not present

## 2016-08-29 DIAGNOSIS — R5383 Other fatigue: Secondary | ICD-10-CM | POA: Diagnosis not present

## 2016-08-29 DIAGNOSIS — Z299 Encounter for prophylactic measures, unspecified: Secondary | ICD-10-CM | POA: Diagnosis not present

## 2016-08-29 DIAGNOSIS — E7800 Pure hypercholesterolemia, unspecified: Secondary | ICD-10-CM | POA: Diagnosis not present

## 2016-08-29 DIAGNOSIS — Z79899 Other long term (current) drug therapy: Secondary | ICD-10-CM | POA: Diagnosis not present

## 2016-08-29 DIAGNOSIS — Z0000 Encounter for general adult medical examination without abnormal findings: Secondary | ICD-10-CM | POA: Diagnosis not present

## 2016-08-29 DIAGNOSIS — K529 Noninfective gastroenteritis and colitis, unspecified: Secondary | ICD-10-CM | POA: Diagnosis not present

## 2016-11-06 ENCOUNTER — Ambulatory Visit: Payer: Medicare Other | Admitting: Cardiovascular Disease

## 2016-11-12 ENCOUNTER — Ambulatory Visit (INDEPENDENT_AMBULATORY_CARE_PROVIDER_SITE_OTHER): Payer: Medicare Other | Admitting: Cardiovascular Disease

## 2016-11-12 ENCOUNTER — Encounter: Payer: Self-pay | Admitting: Cardiovascular Disease

## 2016-11-12 VITALS — BP 135/71 | HR 63 | Ht 60.0 in | Wt 110.0 lb

## 2016-11-12 DIAGNOSIS — I10 Essential (primary) hypertension: Secondary | ICD-10-CM | POA: Diagnosis not present

## 2016-11-12 DIAGNOSIS — I25812 Atherosclerosis of bypass graft of coronary artery of transplanted heart without angina pectoris: Secondary | ICD-10-CM

## 2016-11-12 DIAGNOSIS — I5022 Chronic systolic (congestive) heart failure: Secondary | ICD-10-CM | POA: Diagnosis not present

## 2016-11-12 DIAGNOSIS — I6523 Occlusion and stenosis of bilateral carotid arteries: Secondary | ICD-10-CM

## 2016-11-12 DIAGNOSIS — E785 Hyperlipidemia, unspecified: Secondary | ICD-10-CM | POA: Diagnosis not present

## 2016-11-12 DIAGNOSIS — I519 Heart disease, unspecified: Secondary | ICD-10-CM | POA: Diagnosis not present

## 2016-11-12 NOTE — Progress Notes (Signed)
SUBJECTIVE: The patient returns for routine follow-up. She has a history of coronary artery disease and CABG (May 2012) with chronic systolic heart failure, as well as peripheral vascular disease and is status post right carotid endarterectomy, along with hypertension and stage III chronic kidney disease.  Echocardiogram 05/02/16: Severely reduced left ventricular systolic function, LVEF 30%, mild LVH, diffuse hypokinesis, grade 2 diastolic dysfunction with elevated filling pressures, multiple wall motion abnormalities, moderate mitral and moderate to severe tricuspid regurgitation, severe left atrial dilatation, moderately reduced right ventricular systolic function, severely elevated pulmonary pressures, 82 mmHg.  She denies chest pain, palpitations, leg swelling, and dizziness. She seldom has shortness of breath. She has not had used nitroglycerin or Lasix since her last visit with me.  She walks with a cane only when going outside.  We had discussion about AICD and she refuses.   Review of Systems: As per "subjective", otherwise negative.  Allergies  Allergen Reactions  . Lisinopril Cough  . Sulfonamide Derivatives Rash and Other (See Comments)    Severe Headache    Current Outpatient Prescriptions  Medication Sig Dispense Refill  . acetaminophen (TYLENOL) 325 MG tablet Take 2 tablets (650 mg total) by mouth every 6 (six) hours as needed for moderate pain. 20 tablet 0  . aspirin EC 81 MG tablet Take 81 mg by mouth daily.    Marland Kitchen atorvastatin (LIPITOR) 40 MG tablet Take 40 mg by mouth daily.     . calcium carbonate (OSCAL) 1500 (600 Ca) MG TABS tablet Take 1 tablet by mouth 2 (two) times daily with a meal.    . carvedilol (COREG) 12.5 MG tablet Take 12.5 mg by mouth 2 (two) times daily.    . furosemide (LASIX) 40 MG tablet Take 20-40 mg by mouth daily. PATIENT TAKING AS NEEDED    . nitroGLYCERIN (NITROSTAT) 0.4 MG SL tablet Place 0.4 mg under the tongue every 5 (five) minutes as  needed. For chest pain    . pantoprazole (PROTONIX) 40 MG tablet Take 40 mg by mouth daily.      . potassium chloride SA (K-DUR,KLOR-CON) 20 MEQ tablet Take 1 tablet (20 mEq total) by mouth daily. (Patient taking differently: Take 20 mEq by mouth daily. PATIENT TAKING AS NEEDED) 30 tablet 6   No current facility-administered medications for this visit.     Past Medical History:  Diagnosis Date  . Arthritis    back  . Carotid artery disease (HCC)    followed by vascular surgery  Carotid duplex in 02/2010-60-79% right proximal internal carotid artery stenosis; plaque without focal stenosis on the left; no change compared with 08/30/2009.  . Cataract    bilateral   . CHF (congestive heart failure) (HCC)   . Claustrophobia   . Coronary artery disease    Acute anteroseptal MI treated with urgent PCI and followed by CABG surgery for left main and severe three-vessel coronary disease in 07/2010; prior percutaneous intervention in 05/2007 with DES to the proximal LAD; RCA stent in 09/2009; both stents occluded as well as M1 prior to surgery  . Coronary atherosclerosis of native coronary artery    echocardiogram June 2012 ejection fraction 40% with apical and anteroseptal akinesis with scarring.  . Creatinine elevation   . Edema   . Fall at home Oct. 28, 2016   Fx Right Hip  . GERD (gastroesophageal reflux disease)   . Hemothorax on left   . HOH (hard of hearing)   . Myocardial infarction of anterior  wall greater than eight weeks ago    large anterior wall myocardial infarction status post emergent PCI followed by coronary bypass grafting.,  Ejection fraction 40%  . Osteoporosis 01/17/2015  . Pneumonia   . Shortness of breath    With ambulation  . Transient atrial fibrillation or flutter    post myocardial infarction and she remains in normal sinus rhythm  . Unspecified essential hypertension   . Vitamin D deficiency 01/17/2015    Past Surgical History:  Procedure Laterality Date  .  APPENDECTOMY    . CABGx 3  08/09/10   [name]  . CARDIAC CATHETERIZATION  2001, 2008, 2009, 2012  . CAROTID ENDARTERECTOMY Right 12-13-11   CEA  . COLONOSCOPY  2012  . CORONARY ARTERY BYPASS GRAFT  08/08/2010   Triple bypass  . ENDARTERECTOMY  12/13/2011   Procedure: ENDARTERECTOMY CAROTID;  Surgeon: [name] Earthly, MD;  Location: [facility] OR;  Service: Vascular;  Laterality: Right;  Right Carotid endartectomy with dacron patch angioplasty  . EYE SURGERY Bilateral 2014   cataract lens implant  . FRACTURE SURGERY Right 01-13-15   Hip Fx - pt fell in home  . HAND SURGERY     right hand  . PR VEIN BYPASS GRAFT,AORTO-FEM-POP  07/29/10  . Sternal wound exploration, evacuation of left hemothorax,  and sternal wou nd closure  08/13/10   [name]  . TONSILLECTOMY      Social History   Social History  . Marital status: Widowed    Spouse name: N/A  . Number of children: N/A  . Years of education: N/A   Occupational History  . Not on file.   Social History Main Topics  . Smoking status: Never Smoker  . Smokeless tobacco: Never Used  . Alcohol use No  . Drug use: No  . Sexual activity: Not on file   Other Topics Concern  . Not on file   Social History Narrative   Regular exercise- yes      Vitals:   11/12/16 1109  BP: 135/71  Pulse: 63  SpO2: 96%  Weight: 110 lb (49.9 kg)  Height: 5' (1.524 m)    Wt Readings from Last 3 Encounters:  11/12/16 110 lb (49.9 kg)  05/21/16 115 lb (52.2 kg)  04/08/16 114 lb 3.2 oz (51.8 kg)     PHYSICAL EXAM General: NAD HEENT: Normal. Neck: No JVD, no thyromegaly. Lungs: Clear to auscultation bilaterally with normal respiratory effort. CV: Nondisplaced PMI.  Regular rate and rhythm, normal S1/S2, no S3/S4, soft 1/6 systolic murmur along left sternal border. No pretibial or periankle edema.    Abdomen: Soft, nontender, no distention.  Neurologic: Alert and oriented.  Psych: Normal affect. Skin: Normal. Musculoskeletal: Kyphotic.    ECG:  Most recent ECG reviewed.   Labs: Lab Results  Component Value Date/Time   K 4.3 01/16/2015 04:00 AM   BUN 17 01/16/2015 04:00 AM   CREATININE 1.18 (H) 01/16/2015 04:00 AM   CREATININE 1.44 (H) 10/25/2011 12:35 PM   ALT 23 01/16/2015 04:00 AM   TSH 3.558 01/14/2015 01:07 PM   HGB 9.5 (L) 01/14/2015 12:26 AM     Lipids: No results found for: LDLCALC, LDLDIRECT, CHOL, TRIG, HDL     ASSESSMENT AND PLAN:  1. CAD s/p CABG: Symptomatically stable. Continue ASA, Lipitor, and Coreg. EF 30-35%. Developed cough with lisinopril and reported same with ARB in past.  2. Ischemic cardiomyopathy with chronic systolic heart failure: EF 30%. Euvolemic and stable. Continue Lasix 40 mg daily  with 20 meq KCl.  On Coreg. Developed cough with lisinopril and reported same with ARB in past. Refuses AICD.   3. PVD and s/p right carotid endarterectomy: Continue ASA and statin. Follows with vascular surgery.  4. Essential HTN: Controlled. No changes.    Disposition: Follow up 6 months.   Prentice Docker, M.D., F.A.C.C.

## 2016-11-12 NOTE — Patient Instructions (Signed)

## 2017-03-04 DIAGNOSIS — Z23 Encounter for immunization: Secondary | ICD-10-CM | POA: Diagnosis not present

## 2017-05-23 ENCOUNTER — Ambulatory Visit (INDEPENDENT_AMBULATORY_CARE_PROVIDER_SITE_OTHER): Payer: Medicare Other | Admitting: Cardiovascular Disease

## 2017-05-23 ENCOUNTER — Encounter: Payer: Self-pay | Admitting: Cardiovascular Disease

## 2017-05-23 VITALS — BP 145/77 | HR 65 | Ht 60.0 in | Wt 109.4 lb

## 2017-05-23 DIAGNOSIS — I5022 Chronic systolic (congestive) heart failure: Secondary | ICD-10-CM

## 2017-05-23 DIAGNOSIS — I519 Heart disease, unspecified: Secondary | ICD-10-CM

## 2017-05-23 DIAGNOSIS — I10 Essential (primary) hypertension: Secondary | ICD-10-CM

## 2017-05-23 DIAGNOSIS — I25708 Atherosclerosis of coronary artery bypass graft(s), unspecified, with other forms of angina pectoris: Secondary | ICD-10-CM

## 2017-05-23 DIAGNOSIS — E785 Hyperlipidemia, unspecified: Secondary | ICD-10-CM

## 2017-05-23 DIAGNOSIS — I6523 Occlusion and stenosis of bilateral carotid arteries: Secondary | ICD-10-CM

## 2017-05-23 NOTE — Progress Notes (Signed)
SUBJECTIVE: The patient returns for routine follow-up. She has a history of coronary artery disease and CABG (May 2012) with chronic systolic heart failure, as well as peripheral vascular disease and is status post right carotid endarterectomy, along with hypertension and stage III chronic kidney disease.  Echocardiogram 05/02/16: Severely reduced left ventricular systolic function, LVEF 30%, mild LVH, diffuse hypokinesis, grade 2 diastolic dysfunction with elevated filling pressures, multiple wall motion abnormalities, moderate mitral and moderate to severe tricuspid regurgitation, severe left atrial dilatation, moderately reduced right ventricular systolic function, severely elevated pulmonary pressures, 82 mmHg.  We previously discussed AICD and she refused.  She denies chest pain and said her shortness of breath is manageable.  She only has problems if she gets in a hurry.  She denies palpitations.  She had some feet swelling the other day and took Lasix for a couple of days.  She tries to eat low-sodium foods.     Review of Systems: As per "subjective", otherwise negative.  Allergies  Allergen Reactions  . Lisinopril Cough  . Sulfonamide Derivatives Rash and Other (See Comments)    Severe Headache    Current Outpatient Medications  Medication Sig Dispense Refill  . acetaminophen (TYLENOL) 325 MG tablet Take 2 tablets (650 mg total) by mouth every 6 (six) hours as needed for moderate pain. 20 tablet 0  . aspirin EC 81 MG tablet Take 81 mg by mouth daily.    Marland Kitchen atorvastatin (LIPITOR) 40 MG tablet Take 40 mg by mouth daily.     . calcium carbonate (OSCAL) 1500 (600 Ca) MG TABS tablet Take 1 tablet by mouth 2 (two) times daily with a meal.    . carvedilol (COREG) 12.5 MG tablet Take 12.5 mg by mouth 2 (two) times daily.    . furosemide (LASIX) 40 MG tablet Take 20-40 mg by mouth daily. PATIENT TAKING AS NEEDED    . nitroGLYCERIN (NITROSTAT) 0.4 MG SL tablet Place 0.4 mg under  the tongue every 5 (five) minutes as needed. For chest pain    . pantoprazole (PROTONIX) 40 MG tablet Take 40 mg by mouth daily.      . potassium chloride SA (K-DUR,KLOR-CON) 20 MEQ tablet Take 1 tablet (20 mEq total) by mouth daily. (Patient taking differently: Take 10 mEq by mouth daily. PATIENT TAKING AS NEEDED) 30 tablet 6   No current facility-administered medications for this visit.     Past Medical History:  Diagnosis Date  . Arthritis    back  . Carotid artery disease (HCC)    followed by vascular surgery  Carotid duplex in 02/2010-60-79% right proximal internal carotid artery stenosis; plaque without focal stenosis on the left; no change compared with 08/30/2009.  . Cataract    bilateral   . CHF (congestive heart failure) (HCC)   . Claustrophobia   . Coronary artery disease    Acute anteroseptal MI treated with urgent PCI and followed by CABG surgery for left main and severe three-vessel coronary disease in 07/2010; prior percutaneous intervention in 05/2007 with DES to the proximal LAD; RCA stent in 09/2009; both stents occluded as well as M1 prior to surgery  . Coronary atherosclerosis of native coronary artery    echocardiogram June 2012 ejection fraction 40% with apical and anteroseptal akinesis with scarring.  . Creatinine elevation   . Edema   . Fall at home Oct. 28, 2016   Fx Right Hip  . GERD (gastroesophageal reflux disease)   . Hemothorax on left   .  HOH (hard of hearing)   . Myocardial infarction of anterior wall greater than eight weeks ago    large anterior wall myocardial infarction status post emergent PCI followed by coronary bypass grafting.,  Ejection fraction 40%  . Osteoporosis 01/17/2015  . Pneumonia   . Shortness of breath    With ambulation  . Transient atrial fibrillation or flutter    post myocardial infarction and she remains in normal sinus rhythm  . Unspecified essential hypertension   . Vitamin D deficiency 01/17/2015    Past Surgical History:    Procedure Laterality Date  . APPENDECTOMY    . CABGx 3  08/09/10   [name]  . CARDIAC CATHETERIZATION  2001, 2008, 2009, 2012  . CAROTID ENDARTERECTOMY Right 12-13-11   CEA  . COLONOSCOPY  2012  . CORONARY ARTERY BYPASS GRAFT  08/08/2010   Triple bypass  . ENDARTERECTOMY  12/13/2011   Procedure: ENDARTERECTOMY CAROTID;  Surgeon: Larina Earthly, MD;  Location: [facility] OR;  Service: Vascular;  Laterality: Right;  Right Carotid endartectomy with dacron patch angioplasty  . EYE SURGERY Bilateral 2014   cataract lens implant  . FRACTURE SURGERY Right 01-13-15   Hip Fx - pt fell in home  . HAND SURGERY     right hand  . PR VEIN BYPASS GRAFT,AORTO-FEM-POP  07/29/10  . Sternal wound exploration, evacuation of left hemothorax,  and sternal wou nd closure  08/13/10   Hendrickson  . TONSILLECTOMY      Social History   Socioeconomic History  . Marital status: Widowed    Spouse name: Not on file  . Number of children: Not on file  . Years of education: Not on file  . Highest education level: Not on file  Social Needs  . Financial resource strain: Not on file  . Food insecurity - worry: Not on file  . Food insecurity - inability: Not on file  . Transportation needs - medical: Not on file  . Transportation needs - non-medical: Not on file  Occupational History  . Not on file  Tobacco Use  . Smoking status: Never Smoker  . Smokeless tobacco: Never Used  Substance and Sexual Activity  . Alcohol use: No    Alcohol/week: 0.0 oz  . Drug use: No  . Sexual activity: Not on file  Other Topics Concern  . Not on file  Social History Narrative   Regular exercise- yes      Vitals:   05/23/17 0948  BP: (!) 145/77  Pulse: 65  Weight: 109 lb 6.4 oz (49.6 kg)  Height: 5' (1.524 m)    Wt Readings from Last 3 Encounters:  05/23/17 109 lb 6.4 oz (49.6 kg)  11/12/16 110 lb (49.9 kg)  05/21/16 115 lb (52.2 kg)     PHYSICAL EXAM General: NAD HEENT: Normal. Neck: No JVD, no thyromegaly. Lungs:  Clear to auscultation bilaterally with normal respiratory effort. CV: Regular rate and rhythm, normal S1/S2, no S3/S4, soft 1/6 systolic murmur along left sternal border. No pretibial or periankle edema.    Abdomen: Soft, nontender, no distention.  Neurologic: Alert and oriented.  Psych: Normal affect. Skin: Normal. Musculoskeletal: No gross deformities.    ECG: Most recent ECG reviewed.   Labs: Lab Results  Component Value Date/Time   K 4.3 01/16/2015 04:00 AM   BUN 17 01/16/2015 04:00 AM   CREATININE 1.18 (H) 01/16/2015 04:00 AM   CREATININE 1.44 (H) 10/25/2011 12:35 PM   ALT 23 01/16/2015 04:00 AM   TSH  3.558 01/14/2015 01:07 PM   HGB 9.5 (L) 01/14/2015 12:26 AM     Lipids: No results found for: LDLCALC, LDLDIRECT, CHOL, TRIG, HDL     ASSESSMENT AND PLAN: 1. CAD s/p CABG: Symptomatically stable. Continue ASA, Lipitor, and Coreg. EF 30-35%. Developed cough with lisinopril and reported same with ARB in past.  2. Ischemic cardiomyopathy with chronic systolic heart failure: EF 30%. Euvolemic and stable. Continue Lasix 40 mg daily with 20 meq KCl.  On Coreg. Developed cough with lisinopril and reported same with ARB in past. Refuses AICD.   3. PVD and s/p right carotid endarterectomy: Continue ASA and statin. Follows with vascular surgery.  4. Essential HTN:  Mildly elevated today but normal at last visit.  No changes today.     Disposition: Follow up 6 months   Prentice Docker, M.D., F.A.C.C.

## 2017-05-23 NOTE — Patient Instructions (Signed)

## 2017-05-27 ENCOUNTER — Encounter: Payer: Self-pay | Admitting: Family

## 2017-05-27 ENCOUNTER — Ambulatory Visit (INDEPENDENT_AMBULATORY_CARE_PROVIDER_SITE_OTHER): Payer: Medicare Other | Admitting: Family

## 2017-05-27 ENCOUNTER — Ambulatory Visit (HOSPITAL_COMMUNITY)
Admission: RE | Admit: 2017-05-27 | Discharge: 2017-05-27 | Disposition: A | Discharge status: Home / Self Care | Payer: Medicare Other | Source: Ambulatory Visit | Attending: Family | Admitting: Family

## 2017-05-27 VITALS — BP 145/69 | HR 67 | Temp 96.6°F | Resp 18 | Ht 60.0 in | Wt 112.0 lb

## 2017-05-27 DIAGNOSIS — Z9889 Other specified postprocedural states: Secondary | ICD-10-CM | POA: Diagnosis not present

## 2017-05-27 DIAGNOSIS — Z87891 Personal history of nicotine dependence: Secondary | ICD-10-CM | POA: Diagnosis not present

## 2017-05-27 DIAGNOSIS — I6523 Occlusion and stenosis of bilateral carotid arteries: Secondary | ICD-10-CM

## 2017-05-27 NOTE — Progress Notes (Signed)
Chief Complaint: Follow up Extracranial Carotid Artery Stenosis   History of Present Illness  [name] is a 84 y.o. female who is status post right carotid endarterectomy September 2013 by Dr. Arbie Cookey. She returns today for follow up. She has no history of stroke or TIA symptoms, specifically she denies a history of amaurosis fugax or monocular blindness, unilateral facial drooping, hemiplegia, or receptive or expressive aphasia.  She had an MI in 2012 at [facility], [place], [place] while she was in the hospital for a colonoscopy; she was then transferred to [facility] where she had a 3 vessel CABG. She denies claudication symptoms with walking, denies non-healing wounds.  Her son died 04/06/13 of DM and CRF complications. Her daughter died December 05, 2013 of metastatic cancer. She fell In October 2016, fractured her pelvis, was hospitalized, no surgery. She received home physical therapy.   Pt Diabetic: No Pt smoker: former smoker, quit in the 1980's  Pt meds include: Statin : Yes Betablocker: Yes ASA: Yes Other anticoagulants/antiplatelets: no    Past Medical History:  Diagnosis Date  . Arthritis    back  . Carotid artery disease (HCC)    followed by vascular surgery  Carotid duplex in 02/2010-60-79% right proximal internal carotid artery stenosis; plaque without focal stenosis on the left; no change compared with 08/30/2009.  . Cataract    bilateral   . CHF (congestive heart failure) (HCC)   . Claustrophobia   . Coronary artery disease    Acute anteroseptal MI treated with urgent PCI and followed by CABG surgery for left main and severe three-vessel coronary disease in 07/2010; prior percutaneous intervention in 05/2007 with DES to the proximal LAD; RCA stent in 09/2009; both stents occluded as well as M1 prior to surgery  . Coronary atherosclerosis of native coronary artery    echocardiogram June 2012 ejection fraction 40% with apical and anteroseptal  akinesis with scarring.  . Creatinine elevation   . Edema   . Fall at home Oct. 28, 2016   Fx Right Hip  . GERD (gastroesophageal reflux disease)   . Hemothorax on left   . HOH (hard of hearing)   . Myocardial infarction of anterior wall greater than eight weeks ago    large anterior wall myocardial infarction status post emergent PCI followed by coronary bypass grafting.,  Ejection fraction 40%  . Osteoporosis 01/17/2015  . Pneumonia   . Shortness of breath    With ambulation  . Transient atrial fibrillation or flutter    post myocardial infarction and she remains in normal sinus rhythm  . Unspecified essential hypertension   . Vitamin D deficiency 01/17/2015    Social History Social History   Tobacco Use  . Smoking status: Never Smoker  . Smokeless tobacco: Never Used  Substance Use Topics  . Alcohol use: No    Alcohol/week: 0.0 oz  . Drug use: No    Family History Family History  Problem Relation Age of Onset  . Heart failure Father        chf  . Heart disease Father   . Hyperlipidemia Mother   . Hypertension Mother   . Aneurysm Mother        AAA  . Heart disease Mother   . Hyperlipidemia Son   . Hypertension Son   . Heart disease Son   . Peripheral vascular disease Son        AMPUTATION  . Diabetes Son        Amputation  Left foot  . Cancer Other   . Coronary artery disease Other   . Hyperlipidemia Sister   . Hypertension Sister   . Heart disease Sister        Heart Disease before age 15  . Heart attack Sister     Surgical History Past Surgical History:  Procedure Laterality Date  . APPENDECTOMY    . CABGx 3  08/09/10   [name]  . CARDIAC CATHETERIZATION  2001, 2008, 2009, 2012  . CAROTID ENDARTERECTOMY Right 12-13-11   CEA  . COLONOSCOPY  2012  . CORONARY ARTERY BYPASS GRAFT  08/08/2010   Triple bypass  . ENDARTERECTOMY  12/13/2011   Procedure: ENDARTERECTOMY CAROTID;  Surgeon: Larina Earthly, MD;  Location: [facility] OR;  Service: Vascular;  Laterality:  Right;  Right Carotid endartectomy with dacron patch angioplasty  . EYE SURGERY Bilateral 2014   cataract lens implant  . FRACTURE SURGERY Right 01-13-15   Hip Fx - pt fell in home  . HAND SURGERY     right hand  . PR VEIN BYPASS GRAFT,AORTO-FEM-POP  07/29/10  . Sternal wound exploration, evacuation of left hemothorax,  and sternal wou nd closure  08/13/10   Hendrickson  . TONSILLECTOMY      Allergies  Allergen Reactions  . Lisinopril Cough  . Sulfonamide Derivatives Rash and Other (See Comments)    Severe Headache    Current Outpatient Medications  Medication Sig Dispense Refill  . acetaminophen (TYLENOL) 325 MG tablet Take 2 tablets (650 mg total) by mouth every 6 (six) hours as needed for moderate pain. 20 tablet 0  . aspirin EC 81 MG tablet Take 81 mg by mouth daily.    Marland Kitchen atorvastatin (LIPITOR) 40 MG tablet Take 40 mg by mouth daily.     . calcium carbonate (OSCAL) 1500 (600 Ca) MG TABS tablet Take 1 tablet by mouth 2 (two) times daily with a meal.    . carvedilol (COREG) 12.5 MG tablet Take 12.5 mg by mouth 2 (two) times daily.    . furosemide (LASIX) 40 MG tablet Take 20-40 mg by mouth daily. PATIENT TAKING AS NEEDED    . nitroGLYCERIN (NITROSTAT) 0.4 MG SL tablet Place 0.4 mg under the tongue every 5 (five) minutes as needed. For chest pain    . pantoprazole (PROTONIX) 40 MG tablet Take 40 mg by mouth daily.      . potassium chloride SA (K-DUR,KLOR-CON) 20 MEQ tablet Take 1 tablet (20 mEq total) by mouth daily. (Patient taking differently: Take 10 mEq by mouth daily. PATIENT TAKING AS NEEDED) 30 tablet 6   No current facility-administered medications for this visit.     Review of Systems : See HPI for pertinent positives and negatives.  Physical Examination  Vitals:   05/27/17 1420 05/27/17 1422  BP: (!) 147/67 (!) 145/69  Pulse: 67   Resp: 18   Temp: (!) 96.6 F (35.9 C)   TempSrc: Oral   SpO2: 96%   Weight: 112 lb (50.8 kg)   Height: 5' (1.524 m)    Body mass  index is 21.87 kg/m.  General: WDWN elderly female in NAD GAIT:slow, deliberate, using cane HENT: No gross abnormalities  Eyes: PERRLA Pulmonary: Respirations are non labored, CTAB, no rales, rhonchi, or wheezing. Cardiac: regular rhythm, no detected murmur.  VASCULAR EXAM Carotid Bruits Left Right   Negative Negative    Abdominal aortic pulse is not palpable. Radial pulses are 2+ palpable and equal.      LE Pulses  LEFT RIGHT   POPLITEAL not palpable  not palpable   POSTERIOR TIBIAL  palpable   palpable    DORSALIS PEDIS  ANTERIOR TIBIAL palpable   palpable     Gastrointestinal: soft, nontender, BS WNL, no r/g,no palpable masses. Musculoskeletal: No muscle atrophy/wasting. M/S 3/5 throughout, Extremities without ischemic changes. Moderate cervical and thoracic kyphosis. Skin: No rashes, no ulcers, no cellulitis.   Neurologic:  A&O X 3; appropriate affect, sensation is normal; speech is normal, CN 2-12 intact except hard of hearing, pain and light touch intact in extremities, motor exam as listed above. Psychiatric: Normal thought content, mood appropriate to clinical situation.    Assessment: [name] is a 84 y.o. female who who is status post right carotid endarterectomy September 2013. She has no hx of stroke or TIA.    DATA Carotid Duplex (05/27/17): Patent right carotid endarterectomy site with no evidence of hyperplasia or restenosis 1-39% left ICA stenosis.  Bilateral vertebral artery flow is antegrade.  Bilateral subclavian artery waveforms are normal.  No significant change in comparison to the exams on 03-13-15 and 05-21-16.   Plan: Follow-up in 18 months with Carotid Duplex scan.   I discussed in depth with the patient the nature of  atherosclerosis, and emphasized the importance of maximal medical management including strict control of blood pressure, blood glucose, and lipid levels, obtaining regular exercise, and continued cessation of smoking.  The patient is aware that without maximal medical management the underlying atherosclerotic disease process will progress, limiting the benefit of any interventions. The patient was given information about stroke prevention and what symptoms should prompt the patient to seek immediate medical care. Thank you for allowing [place] to participate in this patient's care.  Charisse March, RN, MSN, FNP-C [facility] Office: [phone]  Clinic Physician: Early  05/27/17 2:26 PM

## 2017-05-27 NOTE — Patient Instructions (Signed)

## 2017-09-02 DIAGNOSIS — Z6821 Body mass index (BMI) 21.0-21.9, adult: Secondary | ICD-10-CM | POA: Diagnosis not present

## 2017-09-02 DIAGNOSIS — I10 Essential (primary) hypertension: Secondary | ICD-10-CM | POA: Diagnosis not present

## 2017-09-02 DIAGNOSIS — E7800 Pure hypercholesterolemia, unspecified: Secondary | ICD-10-CM | POA: Diagnosis not present

## 2017-09-02 DIAGNOSIS — Z7189 Other specified counseling: Secondary | ICD-10-CM | POA: Diagnosis not present

## 2017-09-02 DIAGNOSIS — R5383 Other fatigue: Secondary | ICD-10-CM | POA: Diagnosis not present

## 2017-09-02 DIAGNOSIS — Z79899 Other long term (current) drug therapy: Secondary | ICD-10-CM | POA: Diagnosis not present

## 2017-09-02 DIAGNOSIS — Z1211 Encounter for screening for malignant neoplasm of colon: Secondary | ICD-10-CM | POA: Diagnosis not present

## 2017-09-02 DIAGNOSIS — Z299 Encounter for prophylactic measures, unspecified: Secondary | ICD-10-CM | POA: Diagnosis not present

## 2017-09-02 DIAGNOSIS — Z1331 Encounter for screening for depression: Secondary | ICD-10-CM | POA: Diagnosis not present

## 2017-09-02 DIAGNOSIS — E559 Vitamin D deficiency, unspecified: Secondary | ICD-10-CM | POA: Diagnosis not present

## 2017-09-02 DIAGNOSIS — Z1339 Encounter for screening examination for other mental health and behavioral disorders: Secondary | ICD-10-CM | POA: Diagnosis not present

## 2017-09-02 DIAGNOSIS — Z0000 Encounter for general adult medical examination without abnormal findings: Secondary | ICD-10-CM | POA: Diagnosis not present

## 2017-10-08 DIAGNOSIS — R079 Chest pain, unspecified: Secondary | ICD-10-CM | POA: Diagnosis not present

## 2017-10-08 DIAGNOSIS — J189 Pneumonia, unspecified organism: Secondary | ICD-10-CM | POA: Diagnosis not present

## 2017-10-08 DIAGNOSIS — I509 Heart failure, unspecified: Secondary | ICD-10-CM | POA: Diagnosis not present

## 2017-10-08 DIAGNOSIS — I10 Essential (primary) hypertension: Secondary | ICD-10-CM | POA: Diagnosis not present

## 2017-10-08 DIAGNOSIS — Z6820 Body mass index (BMI) 20.0-20.9, adult: Secondary | ICD-10-CM | POA: Diagnosis not present

## 2017-10-08 DIAGNOSIS — R110 Nausea: Secondary | ICD-10-CM | POA: Diagnosis not present

## 2017-10-08 DIAGNOSIS — R05 Cough: Secondary | ICD-10-CM | POA: Diagnosis not present

## 2017-10-08 DIAGNOSIS — Z299 Encounter for prophylactic measures, unspecified: Secondary | ICD-10-CM | POA: Diagnosis not present

## 2017-10-09 DIAGNOSIS — I4891 Unspecified atrial fibrillation: Secondary | ICD-10-CM | POA: Diagnosis present

## 2017-10-09 DIAGNOSIS — M6281 Muscle weakness (generalized): Secondary | ICD-10-CM | POA: Diagnosis not present

## 2017-10-09 DIAGNOSIS — E7800 Pure hypercholesterolemia, unspecified: Secondary | ICD-10-CM | POA: Diagnosis present

## 2017-10-09 DIAGNOSIS — Z882 Allergy status to sulfonamides status: Secondary | ICD-10-CM | POA: Diagnosis not present

## 2017-10-09 DIAGNOSIS — I509 Heart failure, unspecified: Secondary | ICD-10-CM | POA: Diagnosis not present

## 2017-10-09 DIAGNOSIS — I255 Ischemic cardiomyopathy: Secondary | ICD-10-CM | POA: Diagnosis present

## 2017-10-09 DIAGNOSIS — N183 Chronic kidney disease, stage 3 (moderate): Secondary | ICD-10-CM | POA: Diagnosis present

## 2017-10-09 DIAGNOSIS — I130 Hypertensive heart and chronic kidney disease with heart failure and stage 1 through stage 4 chronic kidney disease, or unspecified chronic kidney disease: Secondary | ICD-10-CM | POA: Diagnosis present

## 2017-10-09 DIAGNOSIS — I340 Nonrheumatic mitral (valve) insufficiency: Secondary | ICD-10-CM | POA: Diagnosis present

## 2017-10-09 DIAGNOSIS — Z951 Presence of aortocoronary bypass graft: Secondary | ICD-10-CM | POA: Diagnosis not present

## 2017-10-09 DIAGNOSIS — N189 Chronic kidney disease, unspecified: Secondary | ICD-10-CM | POA: Diagnosis not present

## 2017-10-09 DIAGNOSIS — R2689 Other abnormalities of gait and mobility: Secondary | ICD-10-CM | POA: Diagnosis not present

## 2017-10-09 DIAGNOSIS — I2510 Atherosclerotic heart disease of native coronary artery without angina pectoris: Secondary | ICD-10-CM | POA: Diagnosis present

## 2017-10-09 DIAGNOSIS — J189 Pneumonia, unspecified organism: Secondary | ICD-10-CM | POA: Diagnosis present

## 2017-10-09 DIAGNOSIS — Z888 Allergy status to other drugs, medicaments and biological substances status: Secondary | ICD-10-CM | POA: Diagnosis not present

## 2017-10-09 DIAGNOSIS — I2699 Other pulmonary embolism without acute cor pulmonale: Secondary | ICD-10-CM | POA: Diagnosis present

## 2017-10-09 DIAGNOSIS — K219 Gastro-esophageal reflux disease without esophagitis: Secondary | ICD-10-CM | POA: Diagnosis present

## 2017-10-09 DIAGNOSIS — I5022 Chronic systolic (congestive) heart failure: Secondary | ICD-10-CM | POA: Diagnosis not present

## 2017-10-14 DIAGNOSIS — R05 Cough: Secondary | ICD-10-CM | POA: Diagnosis not present

## 2017-10-14 DIAGNOSIS — I2699 Other pulmonary embolism without acute cor pulmonale: Secondary | ICD-10-CM | POA: Diagnosis not present

## 2017-10-14 DIAGNOSIS — I4891 Unspecified atrial fibrillation: Secondary | ICD-10-CM | POA: Diagnosis not present

## 2017-10-14 DIAGNOSIS — I130 Hypertensive heart and chronic kidney disease with heart failure and stage 1 through stage 4 chronic kidney disease, or unspecified chronic kidney disease: Secondary | ICD-10-CM | POA: Diagnosis not present

## 2017-10-14 DIAGNOSIS — R2689 Other abnormalities of gait and mobility: Secondary | ICD-10-CM | POA: Diagnosis not present

## 2017-10-14 DIAGNOSIS — J189 Pneumonia, unspecified organism: Secondary | ICD-10-CM | POA: Diagnosis not present

## 2017-10-14 DIAGNOSIS — I509 Heart failure, unspecified: Secondary | ICD-10-CM | POA: Diagnosis not present

## 2017-10-14 DIAGNOSIS — Z86711 Personal history of pulmonary embolism: Secondary | ICD-10-CM | POA: Diagnosis not present

## 2017-10-14 DIAGNOSIS — N189 Chronic kidney disease, unspecified: Secondary | ICD-10-CM | POA: Diagnosis not present

## 2017-10-14 DIAGNOSIS — I5022 Chronic systolic (congestive) heart failure: Secondary | ICD-10-CM | POA: Diagnosis not present

## 2017-10-14 DIAGNOSIS — M6281 Muscle weakness (generalized): Secondary | ICD-10-CM | POA: Diagnosis not present

## 2017-10-15 DIAGNOSIS — I5022 Chronic systolic (congestive) heart failure: Secondary | ICD-10-CM | POA: Diagnosis not present

## 2017-10-15 DIAGNOSIS — N189 Chronic kidney disease, unspecified: Secondary | ICD-10-CM | POA: Diagnosis not present

## 2017-10-15 DIAGNOSIS — I4891 Unspecified atrial fibrillation: Secondary | ICD-10-CM | POA: Diagnosis not present

## 2017-10-15 DIAGNOSIS — I2699 Other pulmonary embolism without acute cor pulmonale: Secondary | ICD-10-CM | POA: Diagnosis not present

## 2017-11-01 DIAGNOSIS — Z86711 Personal history of pulmonary embolism: Secondary | ICD-10-CM | POA: Diagnosis not present

## 2017-11-01 DIAGNOSIS — I5022 Chronic systolic (congestive) heart failure: Secondary | ICD-10-CM | POA: Diagnosis not present

## 2017-11-01 DIAGNOSIS — I4891 Unspecified atrial fibrillation: Secondary | ICD-10-CM | POA: Diagnosis not present

## 2017-11-03 DIAGNOSIS — I2699 Other pulmonary embolism without acute cor pulmonale: Secondary | ICD-10-CM | POA: Diagnosis not present

## 2017-11-03 DIAGNOSIS — I051 Rheumatic mitral insufficiency: Secondary | ICD-10-CM | POA: Diagnosis not present

## 2017-11-03 DIAGNOSIS — Z7901 Long term (current) use of anticoagulants: Secondary | ICD-10-CM | POA: Diagnosis not present

## 2017-11-03 DIAGNOSIS — Z951 Presence of aortocoronary bypass graft: Secondary | ICD-10-CM | POA: Diagnosis not present

## 2017-11-03 DIAGNOSIS — I2510 Atherosclerotic heart disease of native coronary artery without angina pectoris: Secondary | ICD-10-CM | POA: Diagnosis not present

## 2017-11-03 DIAGNOSIS — I4891 Unspecified atrial fibrillation: Secondary | ICD-10-CM | POA: Diagnosis not present

## 2017-11-03 DIAGNOSIS — I130 Hypertensive heart and chronic kidney disease with heart failure and stage 1 through stage 4 chronic kidney disease, or unspecified chronic kidney disease: Secondary | ICD-10-CM | POA: Diagnosis not present

## 2017-11-03 DIAGNOSIS — N189 Chronic kidney disease, unspecified: Secondary | ICD-10-CM | POA: Diagnosis not present

## 2017-11-03 DIAGNOSIS — I255 Ischemic cardiomyopathy: Secondary | ICD-10-CM | POA: Diagnosis not present

## 2017-11-03 DIAGNOSIS — Z8701 Personal history of pneumonia (recurrent): Secondary | ICD-10-CM | POA: Diagnosis not present

## 2017-11-03 DIAGNOSIS — I5022 Chronic systolic (congestive) heart failure: Secondary | ICD-10-CM | POA: Diagnosis not present

## 2017-11-04 DIAGNOSIS — Z789 Other specified health status: Secondary | ICD-10-CM | POA: Diagnosis not present

## 2017-11-04 DIAGNOSIS — I5022 Chronic systolic (congestive) heart failure: Secondary | ICD-10-CM | POA: Diagnosis not present

## 2017-11-04 DIAGNOSIS — I2699 Other pulmonary embolism without acute cor pulmonale: Secondary | ICD-10-CM | POA: Diagnosis not present

## 2017-11-04 DIAGNOSIS — Z6821 Body mass index (BMI) 21.0-21.9, adult: Secondary | ICD-10-CM | POA: Diagnosis not present

## 2017-11-04 DIAGNOSIS — R110 Nausea: Secondary | ICD-10-CM | POA: Diagnosis not present

## 2017-11-04 DIAGNOSIS — Z299 Encounter for prophylactic measures, unspecified: Secondary | ICD-10-CM | POA: Diagnosis not present

## 2017-11-04 DIAGNOSIS — I509 Heart failure, unspecified: Secondary | ICD-10-CM | POA: Diagnosis not present

## 2017-11-05 DIAGNOSIS — I4891 Unspecified atrial fibrillation: Secondary | ICD-10-CM | POA: Diagnosis not present

## 2017-11-05 DIAGNOSIS — I5022 Chronic systolic (congestive) heart failure: Secondary | ICD-10-CM | POA: Diagnosis not present

## 2017-11-05 DIAGNOSIS — I255 Ischemic cardiomyopathy: Secondary | ICD-10-CM | POA: Diagnosis not present

## 2017-11-05 DIAGNOSIS — I2510 Atherosclerotic heart disease of native coronary artery without angina pectoris: Secondary | ICD-10-CM | POA: Diagnosis not present

## 2017-11-05 DIAGNOSIS — I130 Hypertensive heart and chronic kidney disease with heart failure and stage 1 through stage 4 chronic kidney disease, or unspecified chronic kidney disease: Secondary | ICD-10-CM | POA: Diagnosis not present

## 2017-11-05 DIAGNOSIS — I2699 Other pulmonary embolism without acute cor pulmonale: Secondary | ICD-10-CM | POA: Diagnosis not present

## 2017-11-06 DIAGNOSIS — I2699 Other pulmonary embolism without acute cor pulmonale: Secondary | ICD-10-CM | POA: Diagnosis not present

## 2017-11-06 DIAGNOSIS — I4891 Unspecified atrial fibrillation: Secondary | ICD-10-CM | POA: Diagnosis not present

## 2017-11-06 DIAGNOSIS — I2510 Atherosclerotic heart disease of native coronary artery without angina pectoris: Secondary | ICD-10-CM | POA: Diagnosis not present

## 2017-11-06 DIAGNOSIS — I255 Ischemic cardiomyopathy: Secondary | ICD-10-CM | POA: Diagnosis not present

## 2017-11-06 DIAGNOSIS — I5022 Chronic systolic (congestive) heart failure: Secondary | ICD-10-CM | POA: Diagnosis not present

## 2017-11-06 DIAGNOSIS — I130 Hypertensive heart and chronic kidney disease with heart failure and stage 1 through stage 4 chronic kidney disease, or unspecified chronic kidney disease: Secondary | ICD-10-CM | POA: Diagnosis not present

## 2017-11-07 DIAGNOSIS — I2699 Other pulmonary embolism without acute cor pulmonale: Secondary | ICD-10-CM | POA: Diagnosis not present

## 2017-11-07 DIAGNOSIS — I130 Hypertensive heart and chronic kidney disease with heart failure and stage 1 through stage 4 chronic kidney disease, or unspecified chronic kidney disease: Secondary | ICD-10-CM | POA: Diagnosis not present

## 2017-11-07 DIAGNOSIS — I255 Ischemic cardiomyopathy: Secondary | ICD-10-CM | POA: Diagnosis not present

## 2017-11-07 DIAGNOSIS — I4891 Unspecified atrial fibrillation: Secondary | ICD-10-CM | POA: Diagnosis not present

## 2017-11-07 DIAGNOSIS — I2510 Atherosclerotic heart disease of native coronary artery without angina pectoris: Secondary | ICD-10-CM | POA: Diagnosis not present

## 2017-11-07 DIAGNOSIS — I5022 Chronic systolic (congestive) heart failure: Secondary | ICD-10-CM | POA: Diagnosis not present

## 2017-11-10 DIAGNOSIS — I5022 Chronic systolic (congestive) heart failure: Secondary | ICD-10-CM | POA: Diagnosis not present

## 2017-11-10 DIAGNOSIS — I2510 Atherosclerotic heart disease of native coronary artery without angina pectoris: Secondary | ICD-10-CM | POA: Diagnosis not present

## 2017-11-10 DIAGNOSIS — I2699 Other pulmonary embolism without acute cor pulmonale: Secondary | ICD-10-CM | POA: Diagnosis not present

## 2017-11-10 DIAGNOSIS — I130 Hypertensive heart and chronic kidney disease with heart failure and stage 1 through stage 4 chronic kidney disease, or unspecified chronic kidney disease: Secondary | ICD-10-CM | POA: Diagnosis not present

## 2017-11-10 DIAGNOSIS — I255 Ischemic cardiomyopathy: Secondary | ICD-10-CM | POA: Diagnosis not present

## 2017-11-10 DIAGNOSIS — I4891 Unspecified atrial fibrillation: Secondary | ICD-10-CM | POA: Diagnosis not present

## 2017-11-11 DIAGNOSIS — I2699 Other pulmonary embolism without acute cor pulmonale: Secondary | ICD-10-CM | POA: Diagnosis not present

## 2017-11-11 DIAGNOSIS — I130 Hypertensive heart and chronic kidney disease with heart failure and stage 1 through stage 4 chronic kidney disease, or unspecified chronic kidney disease: Secondary | ICD-10-CM | POA: Diagnosis not present

## 2017-11-11 DIAGNOSIS — I2510 Atherosclerotic heart disease of native coronary artery without angina pectoris: Secondary | ICD-10-CM | POA: Diagnosis not present

## 2017-11-11 DIAGNOSIS — I5022 Chronic systolic (congestive) heart failure: Secondary | ICD-10-CM | POA: Diagnosis not present

## 2017-11-11 DIAGNOSIS — I4891 Unspecified atrial fibrillation: Secondary | ICD-10-CM | POA: Diagnosis not present

## 2017-11-11 DIAGNOSIS — I255 Ischemic cardiomyopathy: Secondary | ICD-10-CM | POA: Diagnosis not present

## 2017-11-13 DIAGNOSIS — I2510 Atherosclerotic heart disease of native coronary artery without angina pectoris: Secondary | ICD-10-CM | POA: Diagnosis not present

## 2017-11-13 DIAGNOSIS — I4891 Unspecified atrial fibrillation: Secondary | ICD-10-CM | POA: Diagnosis not present

## 2017-11-13 DIAGNOSIS — I130 Hypertensive heart and chronic kidney disease with heart failure and stage 1 through stage 4 chronic kidney disease, or unspecified chronic kidney disease: Secondary | ICD-10-CM | POA: Diagnosis not present

## 2017-11-13 DIAGNOSIS — I255 Ischemic cardiomyopathy: Secondary | ICD-10-CM | POA: Diagnosis not present

## 2017-11-13 DIAGNOSIS — I2699 Other pulmonary embolism without acute cor pulmonale: Secondary | ICD-10-CM | POA: Diagnosis not present

## 2017-11-13 DIAGNOSIS — I5022 Chronic systolic (congestive) heart failure: Secondary | ICD-10-CM | POA: Diagnosis not present

## 2017-11-14 DIAGNOSIS — I5022 Chronic systolic (congestive) heart failure: Secondary | ICD-10-CM | POA: Diagnosis not present

## 2017-11-14 DIAGNOSIS — I4891 Unspecified atrial fibrillation: Secondary | ICD-10-CM | POA: Diagnosis not present

## 2017-11-14 DIAGNOSIS — I130 Hypertensive heart and chronic kidney disease with heart failure and stage 1 through stage 4 chronic kidney disease, or unspecified chronic kidney disease: Secondary | ICD-10-CM | POA: Diagnosis not present

## 2017-11-14 DIAGNOSIS — I2510 Atherosclerotic heart disease of native coronary artery without angina pectoris: Secondary | ICD-10-CM | POA: Diagnosis not present

## 2017-11-14 DIAGNOSIS — I2699 Other pulmonary embolism without acute cor pulmonale: Secondary | ICD-10-CM | POA: Diagnosis not present

## 2017-11-14 DIAGNOSIS — I255 Ischemic cardiomyopathy: Secondary | ICD-10-CM | POA: Diagnosis not present

## 2017-11-18 DIAGNOSIS — I255 Ischemic cardiomyopathy: Secondary | ICD-10-CM | POA: Diagnosis not present

## 2017-11-18 DIAGNOSIS — I5022 Chronic systolic (congestive) heart failure: Secondary | ICD-10-CM | POA: Diagnosis not present

## 2017-11-18 DIAGNOSIS — I2510 Atherosclerotic heart disease of native coronary artery without angina pectoris: Secondary | ICD-10-CM | POA: Diagnosis not present

## 2017-11-18 DIAGNOSIS — I130 Hypertensive heart and chronic kidney disease with heart failure and stage 1 through stage 4 chronic kidney disease, or unspecified chronic kidney disease: Secondary | ICD-10-CM | POA: Diagnosis not present

## 2017-11-18 DIAGNOSIS — I4891 Unspecified atrial fibrillation: Secondary | ICD-10-CM | POA: Diagnosis not present

## 2017-11-18 DIAGNOSIS — I2699 Other pulmonary embolism without acute cor pulmonale: Secondary | ICD-10-CM | POA: Diagnosis not present

## 2017-11-19 DIAGNOSIS — I2699 Other pulmonary embolism without acute cor pulmonale: Secondary | ICD-10-CM | POA: Diagnosis not present

## 2017-11-19 DIAGNOSIS — I4891 Unspecified atrial fibrillation: Secondary | ICD-10-CM | POA: Diagnosis not present

## 2017-11-19 DIAGNOSIS — I2510 Atherosclerotic heart disease of native coronary artery without angina pectoris: Secondary | ICD-10-CM | POA: Diagnosis not present

## 2017-11-19 DIAGNOSIS — I130 Hypertensive heart and chronic kidney disease with heart failure and stage 1 through stage 4 chronic kidney disease, or unspecified chronic kidney disease: Secondary | ICD-10-CM | POA: Diagnosis not present

## 2017-11-19 DIAGNOSIS — I5022 Chronic systolic (congestive) heart failure: Secondary | ICD-10-CM | POA: Diagnosis not present

## 2017-11-19 DIAGNOSIS — I255 Ischemic cardiomyopathy: Secondary | ICD-10-CM | POA: Diagnosis not present

## 2017-11-20 DIAGNOSIS — I4891 Unspecified atrial fibrillation: Secondary | ICD-10-CM | POA: Diagnosis not present

## 2017-11-20 DIAGNOSIS — I2510 Atherosclerotic heart disease of native coronary artery without angina pectoris: Secondary | ICD-10-CM | POA: Diagnosis not present

## 2017-11-20 DIAGNOSIS — I2699 Other pulmonary embolism without acute cor pulmonale: Secondary | ICD-10-CM | POA: Diagnosis not present

## 2017-11-20 DIAGNOSIS — I130 Hypertensive heart and chronic kidney disease with heart failure and stage 1 through stage 4 chronic kidney disease, or unspecified chronic kidney disease: Secondary | ICD-10-CM | POA: Diagnosis not present

## 2017-11-20 DIAGNOSIS — I255 Ischemic cardiomyopathy: Secondary | ICD-10-CM | POA: Diagnosis not present

## 2017-11-20 DIAGNOSIS — I5022 Chronic systolic (congestive) heart failure: Secondary | ICD-10-CM | POA: Diagnosis not present

## 2017-11-21 DIAGNOSIS — I4891 Unspecified atrial fibrillation: Secondary | ICD-10-CM | POA: Diagnosis not present

## 2017-11-21 DIAGNOSIS — I2510 Atherosclerotic heart disease of native coronary artery without angina pectoris: Secondary | ICD-10-CM | POA: Diagnosis not present

## 2017-11-21 DIAGNOSIS — I255 Ischemic cardiomyopathy: Secondary | ICD-10-CM | POA: Diagnosis not present

## 2017-11-21 DIAGNOSIS — I5022 Chronic systolic (congestive) heart failure: Secondary | ICD-10-CM | POA: Diagnosis not present

## 2017-11-21 DIAGNOSIS — I130 Hypertensive heart and chronic kidney disease with heart failure and stage 1 through stage 4 chronic kidney disease, or unspecified chronic kidney disease: Secondary | ICD-10-CM | POA: Diagnosis not present

## 2017-11-21 DIAGNOSIS — I2699 Other pulmonary embolism without acute cor pulmonale: Secondary | ICD-10-CM | POA: Diagnosis not present

## 2017-11-24 DIAGNOSIS — I130 Hypertensive heart and chronic kidney disease with heart failure and stage 1 through stage 4 chronic kidney disease, or unspecified chronic kidney disease: Secondary | ICD-10-CM | POA: Diagnosis not present

## 2017-11-24 DIAGNOSIS — I4891 Unspecified atrial fibrillation: Secondary | ICD-10-CM | POA: Diagnosis not present

## 2017-11-24 DIAGNOSIS — I255 Ischemic cardiomyopathy: Secondary | ICD-10-CM | POA: Diagnosis not present

## 2017-11-24 DIAGNOSIS — I5022 Chronic systolic (congestive) heart failure: Secondary | ICD-10-CM | POA: Diagnosis not present

## 2017-11-24 DIAGNOSIS — I2510 Atherosclerotic heart disease of native coronary artery without angina pectoris: Secondary | ICD-10-CM | POA: Diagnosis not present

## 2017-11-24 DIAGNOSIS — I2699 Other pulmonary embolism without acute cor pulmonale: Secondary | ICD-10-CM | POA: Diagnosis not present

## 2017-11-25 ENCOUNTER — Encounter: Payer: Self-pay | Admitting: Cardiovascular Disease

## 2017-11-25 ENCOUNTER — Ambulatory Visit (INDEPENDENT_AMBULATORY_CARE_PROVIDER_SITE_OTHER): Payer: Medicare Other | Admitting: Cardiovascular Disease

## 2017-11-25 ENCOUNTER — Encounter: Payer: Self-pay | Admitting: *Deleted

## 2017-11-25 VITALS — BP 128/68 | HR 77 | Ht 61.0 in | Wt 101.0 lb

## 2017-11-25 DIAGNOSIS — I10 Essential (primary) hypertension: Secondary | ICD-10-CM | POA: Diagnosis not present

## 2017-11-25 DIAGNOSIS — I4891 Unspecified atrial fibrillation: Secondary | ICD-10-CM | POA: Diagnosis not present

## 2017-11-25 DIAGNOSIS — I519 Heart disease, unspecified: Secondary | ICD-10-CM | POA: Diagnosis not present

## 2017-11-25 DIAGNOSIS — Z9289 Personal history of other medical treatment: Secondary | ICD-10-CM

## 2017-11-25 DIAGNOSIS — I2699 Other pulmonary embolism without acute cor pulmonale: Secondary | ICD-10-CM | POA: Diagnosis not present

## 2017-11-25 DIAGNOSIS — Z9889 Other specified postprocedural states: Secondary | ICD-10-CM | POA: Diagnosis not present

## 2017-11-25 DIAGNOSIS — I130 Hypertensive heart and chronic kidney disease with heart failure and stage 1 through stage 4 chronic kidney disease, or unspecified chronic kidney disease: Secondary | ICD-10-CM | POA: Diagnosis not present

## 2017-11-25 DIAGNOSIS — I6523 Occlusion and stenosis of bilateral carotid arteries: Secondary | ICD-10-CM | POA: Diagnosis not present

## 2017-11-25 DIAGNOSIS — I2510 Atherosclerotic heart disease of native coronary artery without angina pectoris: Secondary | ICD-10-CM | POA: Diagnosis not present

## 2017-11-25 DIAGNOSIS — I25708 Atherosclerosis of coronary artery bypass graft(s), unspecified, with other forms of angina pectoris: Secondary | ICD-10-CM | POA: Diagnosis not present

## 2017-11-25 DIAGNOSIS — I255 Ischemic cardiomyopathy: Secondary | ICD-10-CM | POA: Diagnosis not present

## 2017-11-25 DIAGNOSIS — I5022 Chronic systolic (congestive) heart failure: Secondary | ICD-10-CM

## 2017-11-25 NOTE — Progress Notes (Signed)
SUBJECTIVE: The patient returns for routine follow-up. She has a history of coronary artery disease and CABG (May 2012) with chronic systolic heart failure, as well as peripheral vascular disease and is status post right carotid endarterectomy, along with hypertension and stage III chronic kidney disease.  Echocardiogram 05/02/16: Severely reduced left ventricular systolic function, LVEF 30%, mild LVH, diffuse hypokinesis, grade 2 diastolic dysfunction with elevated filling pressures, multiple wall motion abnormalities, moderate mitral and moderate to severe tricuspid regurgitation, severe left atrial dilatation, moderately reduced right ventricular systolic function, severely elevated pulmonary pressures, 82 mmHg.  We previously discussed AICD and she refused.  It appears she was recently hospitalized at [facility].  She told me she had pneumonia and blood clots to her lungs.  I do not have a copy of any hospitalization records at this time.  Her appetite is gradually improving.  She had some mild left-sided chest soreness the other night which spontaneously resolved.  She denies leg swelling and palpitations.      Review of Systems: As per "subjective", otherwise negative.  Allergies  Allergen Reactions  . Lisinopril Cough  . Sulfonamide Derivatives Rash and Other (See Comments)    Severe Headache    Current Outpatient Medications  Medication Sig Dispense Refill  . acetaminophen (TYLENOL) 325 MG tablet Take 2 tablets (650 mg total) by mouth every 6 (six) hours as needed for moderate pain. 20 tablet 0  . apixaban (ELIQUIS) 5 MG TABS tablet Take 5 mg by mouth 2 (two) times daily.    Marland Kitchen atorvastatin (LIPITOR) 40 MG tablet Take 40 mg by mouth daily.     . calcium carbonate (OSCAL) 1500 (600 Ca) MG TABS tablet Take 1 tablet by mouth 2 (two) times daily with a meal.    . carvedilol (COREG) 12.5 MG tablet Take 12.5 mg by mouth daily.     . furosemide (LASIX) 40 MG tablet Take 40  mg by mouth daily as needed.     . nitroGLYCERIN (NITROSTAT) 0.4 MG SL tablet Place 0.4 mg under the tongue every 5 (five) minutes as needed. For chest pain    . pantoprazole (PROTONIX) 40 MG tablet Take 40 mg by mouth daily.      . potassium chloride (K-DUR,KLOR-CON) 10 MEQ tablet Take 10 mEq by mouth as needed.     No current facility-administered medications for this visit.     Past Medical History:  Diagnosis Date  . Arthritis    back  . Carotid artery disease (HCC)    followed by vascular surgery  Carotid duplex in 02/2010-60-79% right proximal internal carotid artery stenosis; plaque without focal stenosis on the left; no change compared with 08/30/2009.  . Cataract    bilateral   . CHF (congestive heart failure) (HCC)   . Claustrophobia   . Coronary artery disease    Acute anteroseptal MI treated with urgent PCI and followed by CABG surgery for left main and severe three-vessel coronary disease in 07/2010; prior percutaneous intervention in 05/2007 with DES to the proximal LAD; RCA stent in 09/2009; both stents occluded as well as M1 prior to surgery  . Coronary atherosclerosis of native coronary artery    echocardiogram June 2012 ejection fraction 40% with apical and anteroseptal akinesis with scarring.  . Creatinine elevation   . Edema   . Fall at home Oct. 28, 2016   Fx Right Hip  . GERD (gastroesophageal reflux disease)   . Hemothorax on left   . HOH (  hard of hearing)   . Myocardial infarction of anterior wall greater than eight weeks ago    large anterior wall myocardial infarction status post emergent PCI followed by coronary bypass grafting.,  Ejection fraction 40%  . Osteoporosis 01/17/2015  . Pneumonia   . Shortness of breath    With ambulation  . Transient atrial fibrillation or flutter    post myocardial infarction and she remains in normal sinus rhythm  . Unspecified essential hypertension   . Vitamin D deficiency 01/17/2015    Past Surgical History:  Procedure  Laterality Date  . APPENDECTOMY    . CABGx 3  08/09/10   [name]  . CARDIAC CATHETERIZATION  2001, 2008, 2009, 2012  . CAROTID ENDARTERECTOMY Right 12-13-11   CEA  . COLONOSCOPY  2012  . CORONARY ARTERY BYPASS GRAFT  08/08/2010   Triple bypass  . ENDARTERECTOMY  12/13/2011   Procedure: ENDARTERECTOMY CAROTID;  Surgeon: Larina Earthly, MD;  Location: [facility] OR;  Service: Vascular;  Laterality: Right;  Right Carotid endartectomy with dacron patch angioplasty  . EYE SURGERY Bilateral 2014   cataract lens implant  . FRACTURE SURGERY Right 01-13-15   Hip Fx - pt fell in home  . HAND SURGERY     right hand  . PR VEIN BYPASS GRAFT,AORTO-FEM-POP  07/29/10  . Sternal wound exploration, evacuation of left hemothorax,  and sternal wou nd closure  08/13/10   Hendrickson  . TONSILLECTOMY      Social History   Socioeconomic History  . Marital status: Widowed    Spouse name: Not on file  . Number of children: Not on file  . Years of education: Not on file  . Highest education level: Not on file  Occupational History  . Not on file  Social Needs  . Financial resource strain: Not on file  . Food insecurity:    Worry: Not on file    Inability: Not on file  . Transportation needs:    Medical: Not on file    Non-medical: Not on file  Tobacco Use  . Smoking status: Never Smoker  . Smokeless tobacco: Never Used  Substance and Sexual Activity  . Alcohol use: No    Alcohol/week: 0.0 standard drinks  . Drug use: No  . Sexual activity: Not on file  Lifestyle  . Physical activity:    Days per week: Not on file    Minutes per session: Not on file  . Stress: Not on file  Relationships  . Social connections:    Talks on phone: Not on file    Gets together: Not on file    Attends religious service: Not on file    Active member of club or organization: Not on file    Attends meetings of clubs or organizations: Not on file    Relationship status: Not on file  . Intimate partner violence:    Fear of  current or ex partner: Not on file    Emotionally abused: Not on file    Physically abused: Not on file    Forced sexual activity: Not on file  Other Topics Concern  . Not on file  Social History Narrative   Regular exercise- yes      Vitals:   11/25/17 1526  BP: 128/68  Pulse: 77  SpO2: 98%  Weight: 101 lb (45.8 kg)  Height: 5\' 1"  (1.549 m)    Wt Readings from Last 3 Encounters:  11/25/17 101 lb (45.8 kg)  05/27/17 112 lb (  50.8 kg)  05/23/17 109 lb 6.4 oz (49.6 kg)     PHYSICAL EXAM General: NAD HEENT: Normal. Neck: No JVD, no thyromegaly. Lungs: Clear to auscultation bilaterally with normal respiratory effort. CV: Regular rate and rhythm, normal S1/S2, no S3/S4, soft 1/6 systolic murmur along left sternal border. No pretibial or periankle edema.   Abdomen: Soft, nontender, no distention.  Neurologic: Alert and oriented.  Psych: Normal affect. Skin: Normal. Musculoskeletal: No gross deformities.    ECG: Reviewed above under Subjective   Labs: Lab Results  Component Value Date/Time   K 4.3 01/16/2015 04:00 AM   BUN 17 01/16/2015 04:00 AM   CREATININE 1.18 (H) 01/16/2015 04:00 AM   CREATININE 1.44 (H) 10/25/2011 12:35 PM   ALT 23 01/16/2015 04:00 AM   TSH 3.558 01/14/2015 01:07 PM   HGB 9.5 (L) 01/14/2015 12:26 AM     Lipids: No results found for: LDLCALC, LDLDIRECT, CHOL, TRIG, HDL     ASSESSMENT AND PLAN: 1. CAD s/p CABG: Symptomatically stable. Continue Lipitor and Coreg. EF 30-35%. Developed cough with lisinopril and reported same with ARB in past.  She is no longer on aspirin as she was started on Eliquis while hospitalized, presumably for pulmonary embolism.  2. Ischemic cardiomyopathy with chronic systolic heart failure: EF 30%. Euvolemic and stable. Continue Lasix 40 mg daily with 20 meq KCl.On Coreg. Developed cough with lisinopril and reported same with ARB in past. RefusesAICD.   3. PVD and s/p right carotid endarterectomy:Continue  statin.Follows with vascular surgery.  She is now on Eliquis.  4. Essential WFU:XNATF pressure is normal.  No changes to therapy.  5.  Pulmonary embolism: I will request hospitalization records for personal review.  She is currently anticoagulated with Eliquis.   Disposition: Follow up 3 months   Prentice Docker, M.D., F.A.C.C.

## 2017-11-25 NOTE — Progress Notes (Signed)
ekg 

## 2017-11-25 NOTE — Patient Instructions (Signed)
Medication Instructions:  Continue all current medications.  Labwork: none  Testing/Procedures: none  Follow-Up: 3 months   Any Other Special Instructions Will Be Listed Below (If Applicable).  If you need a refill on your cardiac medications before your next appointment, please call your pharmacy.  

## 2017-11-27 DIAGNOSIS — I2510 Atherosclerotic heart disease of native coronary artery without angina pectoris: Secondary | ICD-10-CM | POA: Diagnosis not present

## 2017-11-27 DIAGNOSIS — I2699 Other pulmonary embolism without acute cor pulmonale: Secondary | ICD-10-CM | POA: Diagnosis not present

## 2017-11-27 DIAGNOSIS — I255 Ischemic cardiomyopathy: Secondary | ICD-10-CM | POA: Diagnosis not present

## 2017-11-27 DIAGNOSIS — I4891 Unspecified atrial fibrillation: Secondary | ICD-10-CM | POA: Diagnosis not present

## 2017-11-27 DIAGNOSIS — I130 Hypertensive heart and chronic kidney disease with heart failure and stage 1 through stage 4 chronic kidney disease, or unspecified chronic kidney disease: Secondary | ICD-10-CM | POA: Diagnosis not present

## 2017-11-27 DIAGNOSIS — I5022 Chronic systolic (congestive) heart failure: Secondary | ICD-10-CM | POA: Diagnosis not present

## 2017-11-28 DIAGNOSIS — I2699 Other pulmonary embolism without acute cor pulmonale: Secondary | ICD-10-CM | POA: Diagnosis not present

## 2017-11-28 DIAGNOSIS — I255 Ischemic cardiomyopathy: Secondary | ICD-10-CM | POA: Diagnosis not present

## 2017-11-28 DIAGNOSIS — I5022 Chronic systolic (congestive) heart failure: Secondary | ICD-10-CM | POA: Diagnosis not present

## 2017-11-28 DIAGNOSIS — I4891 Unspecified atrial fibrillation: Secondary | ICD-10-CM | POA: Diagnosis not present

## 2017-11-28 DIAGNOSIS — I2510 Atherosclerotic heart disease of native coronary artery without angina pectoris: Secondary | ICD-10-CM | POA: Diagnosis not present

## 2017-11-28 DIAGNOSIS — I130 Hypertensive heart and chronic kidney disease with heart failure and stage 1 through stage 4 chronic kidney disease, or unspecified chronic kidney disease: Secondary | ICD-10-CM | POA: Diagnosis not present

## 2017-12-01 DIAGNOSIS — I130 Hypertensive heart and chronic kidney disease with heart failure and stage 1 through stage 4 chronic kidney disease, or unspecified chronic kidney disease: Secondary | ICD-10-CM | POA: Diagnosis not present

## 2017-12-01 DIAGNOSIS — I5022 Chronic systolic (congestive) heart failure: Secondary | ICD-10-CM | POA: Diagnosis not present

## 2017-12-01 DIAGNOSIS — I2699 Other pulmonary embolism without acute cor pulmonale: Secondary | ICD-10-CM | POA: Diagnosis not present

## 2017-12-01 DIAGNOSIS — I255 Ischemic cardiomyopathy: Secondary | ICD-10-CM | POA: Diagnosis not present

## 2017-12-01 DIAGNOSIS — I4891 Unspecified atrial fibrillation: Secondary | ICD-10-CM | POA: Diagnosis not present

## 2017-12-01 DIAGNOSIS — I2510 Atherosclerotic heart disease of native coronary artery without angina pectoris: Secondary | ICD-10-CM | POA: Diagnosis not present

## 2017-12-02 DIAGNOSIS — I255 Ischemic cardiomyopathy: Secondary | ICD-10-CM | POA: Diagnosis not present

## 2017-12-02 DIAGNOSIS — I2699 Other pulmonary embolism without acute cor pulmonale: Secondary | ICD-10-CM | POA: Diagnosis not present

## 2017-12-02 DIAGNOSIS — I5022 Chronic systolic (congestive) heart failure: Secondary | ICD-10-CM | POA: Diagnosis not present

## 2017-12-02 DIAGNOSIS — I130 Hypertensive heart and chronic kidney disease with heart failure and stage 1 through stage 4 chronic kidney disease, or unspecified chronic kidney disease: Secondary | ICD-10-CM | POA: Diagnosis not present

## 2017-12-02 DIAGNOSIS — I4891 Unspecified atrial fibrillation: Secondary | ICD-10-CM | POA: Diagnosis not present

## 2017-12-02 DIAGNOSIS — I2510 Atherosclerotic heart disease of native coronary artery without angina pectoris: Secondary | ICD-10-CM | POA: Diagnosis not present

## 2017-12-04 DIAGNOSIS — I5022 Chronic systolic (congestive) heart failure: Secondary | ICD-10-CM | POA: Diagnosis not present

## 2017-12-04 DIAGNOSIS — I219 Acute myocardial infarction, unspecified: Secondary | ICD-10-CM | POA: Diagnosis not present

## 2017-12-04 DIAGNOSIS — Z23 Encounter for immunization: Secondary | ICD-10-CM | POA: Diagnosis not present

## 2017-12-04 DIAGNOSIS — Z299 Encounter for prophylactic measures, unspecified: Secondary | ICD-10-CM | POA: Diagnosis not present

## 2017-12-04 DIAGNOSIS — I509 Heart failure, unspecified: Secondary | ICD-10-CM | POA: Diagnosis not present

## 2017-12-04 DIAGNOSIS — I2699 Other pulmonary embolism without acute cor pulmonale: Secondary | ICD-10-CM | POA: Diagnosis not present

## 2017-12-04 DIAGNOSIS — I10 Essential (primary) hypertension: Secondary | ICD-10-CM | POA: Diagnosis not present

## 2017-12-06 DIAGNOSIS — Z79899 Other long term (current) drug therapy: Secondary | ICD-10-CM | POA: Diagnosis not present

## 2017-12-06 DIAGNOSIS — I255 Ischemic cardiomyopathy: Secondary | ICD-10-CM | POA: Diagnosis not present

## 2017-12-06 DIAGNOSIS — I252 Old myocardial infarction: Secondary | ICD-10-CM | POA: Diagnosis not present

## 2017-12-06 DIAGNOSIS — R531 Weakness: Secondary | ICD-10-CM | POA: Diagnosis not present

## 2017-12-06 DIAGNOSIS — S0181XA Laceration without foreign body of other part of head, initial encounter: Secondary | ICD-10-CM | POA: Diagnosis not present

## 2017-12-06 DIAGNOSIS — S0190XA Unspecified open wound of unspecified part of head, initial encounter: Secondary | ICD-10-CM | POA: Diagnosis not present

## 2017-12-06 DIAGNOSIS — R609 Edema, unspecified: Secondary | ICD-10-CM | POA: Diagnosis not present

## 2017-12-06 DIAGNOSIS — S0012XA Contusion of left eyelid and periocular area, initial encounter: Secondary | ICD-10-CM | POA: Diagnosis not present

## 2017-12-06 DIAGNOSIS — Z7901 Long term (current) use of anticoagulants: Secondary | ICD-10-CM | POA: Diagnosis not present

## 2017-12-06 DIAGNOSIS — S0083XA Contusion of other part of head, initial encounter: Secondary | ICD-10-CM | POA: Diagnosis not present

## 2017-12-06 DIAGNOSIS — Z888 Allergy status to other drugs, medicaments and biological substances status: Secondary | ICD-10-CM | POA: Diagnosis not present

## 2017-12-06 DIAGNOSIS — S022XXA Fracture of nasal bones, initial encounter for closed fracture: Secondary | ICD-10-CM | POA: Diagnosis not present

## 2017-12-06 DIAGNOSIS — N183 Chronic kidney disease, stage 3 (moderate): Secondary | ICD-10-CM | POA: Diagnosis not present

## 2017-12-06 DIAGNOSIS — M1990 Unspecified osteoarthritis, unspecified site: Secondary | ICD-10-CM | POA: Diagnosis not present

## 2017-12-06 DIAGNOSIS — E559 Vitamin D deficiency, unspecified: Secondary | ICD-10-CM | POA: Diagnosis not present

## 2017-12-06 DIAGNOSIS — R58 Hemorrhage, not elsewhere classified: Secondary | ICD-10-CM | POA: Diagnosis not present

## 2017-12-06 DIAGNOSIS — S8991XA Unspecified injury of right lower leg, initial encounter: Secondary | ICD-10-CM | POA: Diagnosis not present

## 2017-12-06 DIAGNOSIS — Z86711 Personal history of pulmonary embolism: Secondary | ICD-10-CM | POA: Diagnosis not present

## 2017-12-06 DIAGNOSIS — Z882 Allergy status to sulfonamides status: Secondary | ICD-10-CM | POA: Diagnosis not present

## 2017-12-06 DIAGNOSIS — I2510 Atherosclerotic heart disease of native coronary artery without angina pectoris: Secondary | ICD-10-CM | POA: Diagnosis not present

## 2017-12-06 DIAGNOSIS — W1839XA Other fall on same level, initial encounter: Secondary | ICD-10-CM | POA: Diagnosis not present

## 2017-12-06 DIAGNOSIS — E7800 Pure hypercholesterolemia, unspecified: Secondary | ICD-10-CM | POA: Diagnosis not present

## 2017-12-06 DIAGNOSIS — I130 Hypertensive heart and chronic kidney disease with heart failure and stage 1 through stage 4 chronic kidney disease, or unspecified chronic kidney disease: Secondary | ICD-10-CM | POA: Diagnosis not present

## 2017-12-06 DIAGNOSIS — Z955 Presence of coronary angioplasty implant and graft: Secondary | ICD-10-CM | POA: Diagnosis not present

## 2017-12-06 DIAGNOSIS — Z951 Presence of aortocoronary bypass graft: Secondary | ICD-10-CM | POA: Diagnosis not present

## 2017-12-06 DIAGNOSIS — S0011XA Contusion of right eyelid and periocular area, initial encounter: Secondary | ICD-10-CM | POA: Diagnosis not present

## 2017-12-06 DIAGNOSIS — S199XXA Unspecified injury of neck, initial encounter: Secondary | ICD-10-CM | POA: Diagnosis not present

## 2017-12-06 DIAGNOSIS — I5022 Chronic systolic (congestive) heart failure: Secondary | ICD-10-CM | POA: Diagnosis not present

## 2017-12-06 DIAGNOSIS — R220 Localized swelling, mass and lump, head: Secondary | ICD-10-CM | POA: Diagnosis not present

## 2017-12-06 DIAGNOSIS — M25561 Pain in right knee: Secondary | ICD-10-CM | POA: Diagnosis not present

## 2017-12-06 DIAGNOSIS — K219 Gastro-esophageal reflux disease without esophagitis: Secondary | ICD-10-CM | POA: Diagnosis not present

## 2017-12-07 DIAGNOSIS — S022XXA Fracture of nasal bones, initial encounter for closed fracture: Secondary | ICD-10-CM | POA: Diagnosis not present

## 2017-12-07 DIAGNOSIS — S0083XA Contusion of other part of head, initial encounter: Secondary | ICD-10-CM | POA: Diagnosis not present

## 2017-12-07 DIAGNOSIS — R531 Weakness: Secondary | ICD-10-CM | POA: Diagnosis not present

## 2017-12-07 DIAGNOSIS — S0181XA Laceration without foreign body of other part of head, initial encounter: Secondary | ICD-10-CM | POA: Diagnosis not present

## 2017-12-09 DIAGNOSIS — N183 Chronic kidney disease, stage 3 (moderate): Secondary | ICD-10-CM | POA: Diagnosis not present

## 2017-12-09 DIAGNOSIS — Z299 Encounter for prophylactic measures, unspecified: Secondary | ICD-10-CM | POA: Diagnosis not present

## 2017-12-09 DIAGNOSIS — I10 Essential (primary) hypertension: Secondary | ICD-10-CM | POA: Diagnosis not present

## 2017-12-09 DIAGNOSIS — Z6820 Body mass index (BMI) 20.0-20.9, adult: Secondary | ICD-10-CM | POA: Diagnosis not present

## 2017-12-09 DIAGNOSIS — Z9181 History of falling: Secondary | ICD-10-CM | POA: Diagnosis not present

## 2017-12-17 DIAGNOSIS — Z6820 Body mass index (BMI) 20.0-20.9, adult: Secondary | ICD-10-CM | POA: Diagnosis not present

## 2017-12-17 DIAGNOSIS — Z299 Encounter for prophylactic measures, unspecified: Secondary | ICD-10-CM | POA: Diagnosis not present

## 2017-12-17 DIAGNOSIS — Z713 Dietary counseling and surveillance: Secondary | ICD-10-CM | POA: Diagnosis not present

## 2017-12-17 DIAGNOSIS — I10 Essential (primary) hypertension: Secondary | ICD-10-CM | POA: Diagnosis not present

## 2017-12-26 DIAGNOSIS — Z299 Encounter for prophylactic measures, unspecified: Secondary | ICD-10-CM | POA: Diagnosis not present

## 2017-12-26 DIAGNOSIS — E7800 Pure hypercholesterolemia, unspecified: Secondary | ICD-10-CM | POA: Diagnosis not present

## 2017-12-26 DIAGNOSIS — I10 Essential (primary) hypertension: Secondary | ICD-10-CM | POA: Diagnosis not present

## 2017-12-26 DIAGNOSIS — Z713 Dietary counseling and surveillance: Secondary | ICD-10-CM | POA: Diagnosis not present

## 2017-12-26 DIAGNOSIS — Z6820 Body mass index (BMI) 20.0-20.9, adult: Secondary | ICD-10-CM | POA: Diagnosis not present

## 2018-02-03 DIAGNOSIS — M5489 Other dorsalgia: Secondary | ICD-10-CM | POA: Diagnosis not present

## 2018-02-03 DIAGNOSIS — K219 Gastro-esophageal reflux disease without esophagitis: Secondary | ICD-10-CM | POA: Diagnosis present

## 2018-02-03 DIAGNOSIS — I509 Heart failure, unspecified: Secondary | ICD-10-CM | POA: Diagnosis not present

## 2018-02-03 DIAGNOSIS — I6529 Occlusion and stenosis of unspecified carotid artery: Secondary | ICD-10-CM | POA: Diagnosis not present

## 2018-02-03 DIAGNOSIS — M549 Dorsalgia, unspecified: Secondary | ICD-10-CM | POA: Diagnosis not present

## 2018-02-03 DIAGNOSIS — W0110XA Fall on same level from slipping, tripping and stumbling with subsequent striking against unspecified object, initial encounter: Secondary | ICD-10-CM | POA: Diagnosis not present

## 2018-02-03 DIAGNOSIS — E7800 Pure hypercholesterolemia, unspecified: Secondary | ICD-10-CM | POA: Diagnosis present

## 2018-02-03 DIAGNOSIS — I2510 Atherosclerotic heart disease of native coronary artery without angina pectoris: Secondary | ICD-10-CM | POA: Diagnosis present

## 2018-02-03 DIAGNOSIS — I252 Old myocardial infarction: Secondary | ICD-10-CM | POA: Diagnosis not present

## 2018-02-03 DIAGNOSIS — R2689 Other abnormalities of gait and mobility: Secondary | ICD-10-CM | POA: Diagnosis present

## 2018-02-03 DIAGNOSIS — I340 Nonrheumatic mitral (valve) insufficiency: Secondary | ICD-10-CM | POA: Diagnosis not present

## 2018-02-03 DIAGNOSIS — N189 Chronic kidney disease, unspecified: Secondary | ICD-10-CM | POA: Diagnosis present

## 2018-02-03 DIAGNOSIS — I255 Ischemic cardiomyopathy: Secondary | ICD-10-CM | POA: Diagnosis not present

## 2018-02-03 DIAGNOSIS — J189 Pneumonia, unspecified organism: Secondary | ICD-10-CM | POA: Diagnosis not present

## 2018-02-03 DIAGNOSIS — Z79899 Other long term (current) drug therapy: Secondary | ICD-10-CM | POA: Diagnosis not present

## 2018-02-03 DIAGNOSIS — I5022 Chronic systolic (congestive) heart failure: Secondary | ICD-10-CM | POA: Diagnosis present

## 2018-02-03 DIAGNOSIS — R52 Pain, unspecified: Secondary | ICD-10-CM | POA: Diagnosis not present

## 2018-02-03 DIAGNOSIS — E785 Hyperlipidemia, unspecified: Secondary | ICD-10-CM | POA: Diagnosis not present

## 2018-02-03 DIAGNOSIS — Z86711 Personal history of pulmonary embolism: Secondary | ICD-10-CM | POA: Diagnosis not present

## 2018-02-03 DIAGNOSIS — I4891 Unspecified atrial fibrillation: Secondary | ICD-10-CM | POA: Diagnosis not present

## 2018-02-03 DIAGNOSIS — I130 Hypertensive heart and chronic kidney disease with heart failure and stage 1 through stage 4 chronic kidney disease, or unspecified chronic kidney disease: Secondary | ICD-10-CM | POA: Diagnosis not present

## 2018-02-03 DIAGNOSIS — S299XXA Unspecified injury of thorax, initial encounter: Secondary | ICD-10-CM | POA: Diagnosis not present

## 2018-02-03 DIAGNOSIS — Z9181 History of falling: Secondary | ICD-10-CM | POA: Diagnosis not present

## 2018-02-03 DIAGNOSIS — Z7901 Long term (current) use of anticoagulants: Secondary | ICD-10-CM | POA: Diagnosis not present

## 2018-02-03 DIAGNOSIS — S0990XA Unspecified injury of head, initial encounter: Secondary | ICD-10-CM | POA: Diagnosis not present

## 2018-02-03 DIAGNOSIS — E876 Hypokalemia: Secondary | ICD-10-CM | POA: Diagnosis not present

## 2018-02-03 DIAGNOSIS — R079 Chest pain, unspecified: Secondary | ICD-10-CM | POA: Diagnosis not present

## 2018-02-06 DIAGNOSIS — I5022 Chronic systolic (congestive) heart failure: Secondary | ICD-10-CM | POA: Diagnosis not present

## 2018-02-06 DIAGNOSIS — R197 Diarrhea, unspecified: Secondary | ICD-10-CM | POA: Diagnosis not present

## 2018-02-06 DIAGNOSIS — N189 Chronic kidney disease, unspecified: Secondary | ICD-10-CM | POA: Diagnosis not present

## 2018-02-06 DIAGNOSIS — I10 Essential (primary) hypertension: Secondary | ICD-10-CM | POA: Diagnosis not present

## 2018-02-06 DIAGNOSIS — J189 Pneumonia, unspecified organism: Secondary | ICD-10-CM | POA: Diagnosis not present

## 2018-02-06 DIAGNOSIS — I2699 Other pulmonary embolism without acute cor pulmonale: Secondary | ICD-10-CM | POA: Diagnosis not present

## 2018-02-06 DIAGNOSIS — E785 Hyperlipidemia, unspecified: Secondary | ICD-10-CM | POA: Diagnosis not present

## 2018-02-06 DIAGNOSIS — Z9181 History of falling: Secondary | ICD-10-CM | POA: Diagnosis not present

## 2018-02-06 DIAGNOSIS — Z6821 Body mass index (BMI) 21.0-21.9, adult: Secondary | ICD-10-CM | POA: Diagnosis not present

## 2018-02-06 DIAGNOSIS — R2689 Other abnormalities of gait and mobility: Secondary | ICD-10-CM | POA: Diagnosis not present

## 2018-02-06 DIAGNOSIS — M546 Pain in thoracic spine: Secondary | ICD-10-CM | POA: Diagnosis not present

## 2018-02-06 DIAGNOSIS — N183 Chronic kidney disease, stage 3 (moderate): Secondary | ICD-10-CM | POA: Diagnosis not present

## 2018-02-06 DIAGNOSIS — I255 Ischemic cardiomyopathy: Secondary | ICD-10-CM | POA: Diagnosis not present

## 2018-02-06 DIAGNOSIS — I509 Heart failure, unspecified: Secondary | ICD-10-CM | POA: Diagnosis not present

## 2018-02-06 DIAGNOSIS — K219 Gastro-esophageal reflux disease without esophagitis: Secondary | ICD-10-CM | POA: Diagnosis not present

## 2018-02-06 DIAGNOSIS — I6529 Occlusion and stenosis of unspecified carotid artery: Secondary | ICD-10-CM | POA: Diagnosis not present

## 2018-02-06 DIAGNOSIS — E876 Hypokalemia: Secondary | ICD-10-CM | POA: Diagnosis not present

## 2018-02-06 DIAGNOSIS — Z8701 Personal history of pneumonia (recurrent): Secondary | ICD-10-CM | POA: Diagnosis not present

## 2018-02-06 DIAGNOSIS — I2510 Atherosclerotic heart disease of native coronary artery without angina pectoris: Secondary | ICD-10-CM | POA: Diagnosis not present

## 2018-02-06 DIAGNOSIS — I340 Nonrheumatic mitral (valve) insufficiency: Secondary | ICD-10-CM | POA: Diagnosis not present

## 2018-02-06 DIAGNOSIS — I4891 Unspecified atrial fibrillation: Secondary | ICD-10-CM | POA: Diagnosis not present

## 2018-02-06 DIAGNOSIS — Z299 Encounter for prophylactic measures, unspecified: Secondary | ICD-10-CM | POA: Diagnosis not present

## 2018-02-06 DIAGNOSIS — R600 Localized edema: Secondary | ICD-10-CM | POA: Diagnosis not present

## 2018-02-06 DIAGNOSIS — K210 Gastro-esophageal reflux disease with esophagitis: Secondary | ICD-10-CM | POA: Diagnosis not present

## 2018-02-06 DIAGNOSIS — R110 Nausea: Secondary | ICD-10-CM | POA: Diagnosis not present

## 2018-02-06 DIAGNOSIS — I130 Hypertensive heart and chronic kidney disease with heart failure and stage 1 through stage 4 chronic kidney disease, or unspecified chronic kidney disease: Secondary | ICD-10-CM | POA: Diagnosis not present

## 2018-02-09 DIAGNOSIS — M546 Pain in thoracic spine: Secondary | ICD-10-CM | POA: Diagnosis not present

## 2018-02-09 DIAGNOSIS — Z8701 Personal history of pneumonia (recurrent): Secondary | ICD-10-CM | POA: Diagnosis not present

## 2018-02-18 DIAGNOSIS — R600 Localized edema: Secondary | ICD-10-CM | POA: Diagnosis not present

## 2018-02-18 DIAGNOSIS — R197 Diarrhea, unspecified: Secondary | ICD-10-CM | POA: Diagnosis not present

## 2018-02-22 DIAGNOSIS — K219 Gastro-esophageal reflux disease without esophagitis: Secondary | ICD-10-CM | POA: Diagnosis not present

## 2018-02-22 DIAGNOSIS — R110 Nausea: Secondary | ICD-10-CM | POA: Diagnosis not present

## 2018-03-03 ENCOUNTER — Ambulatory Visit: Payer: Medicare Other | Admitting: Cardiovascular Disease

## 2018-03-04 DIAGNOSIS — K210 Gastro-esophageal reflux disease with esophagitis: Secondary | ICD-10-CM | POA: Diagnosis not present

## 2018-03-04 DIAGNOSIS — R110 Nausea: Secondary | ICD-10-CM | POA: Diagnosis not present

## 2018-03-12 DIAGNOSIS — I509 Heart failure, unspecified: Secondary | ICD-10-CM | POA: Diagnosis not present

## 2018-03-12 DIAGNOSIS — I10 Essential (primary) hypertension: Secondary | ICD-10-CM | POA: Diagnosis not present

## 2018-03-12 DIAGNOSIS — I2699 Other pulmonary embolism without acute cor pulmonale: Secondary | ICD-10-CM | POA: Diagnosis not present

## 2018-03-12 DIAGNOSIS — Z299 Encounter for prophylactic measures, unspecified: Secondary | ICD-10-CM | POA: Diagnosis not present

## 2018-03-12 DIAGNOSIS — R110 Nausea: Secondary | ICD-10-CM | POA: Diagnosis not present

## 2018-03-12 DIAGNOSIS — Z6821 Body mass index (BMI) 21.0-21.9, adult: Secondary | ICD-10-CM | POA: Diagnosis not present

## 2018-03-12 DIAGNOSIS — N183 Chronic kidney disease, stage 3 (moderate): Secondary | ICD-10-CM | POA: Diagnosis not present

## 2018-03-14 DIAGNOSIS — I5022 Chronic systolic (congestive) heart failure: Secondary | ICD-10-CM | POA: Diagnosis not present

## 2018-03-14 DIAGNOSIS — I2510 Atherosclerotic heart disease of native coronary artery without angina pectoris: Secondary | ICD-10-CM | POA: Diagnosis not present

## 2018-03-14 DIAGNOSIS — J209 Acute bronchitis, unspecified: Secondary | ICD-10-CM | POA: Diagnosis not present

## 2018-03-14 DIAGNOSIS — E860 Dehydration: Secondary | ICD-10-CM | POA: Diagnosis not present

## 2018-03-14 DIAGNOSIS — N390 Urinary tract infection, site not specified: Secondary | ICD-10-CM | POA: Diagnosis not present

## 2018-03-14 DIAGNOSIS — N179 Acute kidney failure, unspecified: Secondary | ICD-10-CM | POA: Diagnosis not present

## 2018-03-14 DIAGNOSIS — B952 Enterococcus as the cause of diseases classified elsewhere: Secondary | ICD-10-CM | POA: Diagnosis not present

## 2018-03-14 DIAGNOSIS — R05 Cough: Secondary | ICD-10-CM | POA: Diagnosis not present

## 2018-03-14 DIAGNOSIS — I340 Nonrheumatic mitral (valve) insufficiency: Secondary | ICD-10-CM | POA: Diagnosis not present

## 2018-03-15 DIAGNOSIS — I5022 Chronic systolic (congestive) heart failure: Secondary | ICD-10-CM | POA: Diagnosis not present

## 2018-03-15 DIAGNOSIS — N179 Acute kidney failure, unspecified: Secondary | ICD-10-CM | POA: Diagnosis not present

## 2018-03-15 DIAGNOSIS — N390 Urinary tract infection, site not specified: Secondary | ICD-10-CM | POA: Diagnosis not present

## 2018-03-15 DIAGNOSIS — B952 Enterococcus as the cause of diseases classified elsewhere: Secondary | ICD-10-CM | POA: Diagnosis not present

## 2018-03-16 DIAGNOSIS — I2510 Atherosclerotic heart disease of native coronary artery without angina pectoris: Secondary | ICD-10-CM | POA: Diagnosis present

## 2018-03-16 DIAGNOSIS — E860 Dehydration: Secondary | ICD-10-CM | POA: Diagnosis present

## 2018-03-16 DIAGNOSIS — N390 Urinary tract infection, site not specified: Secondary | ICD-10-CM | POA: Diagnosis present

## 2018-03-16 DIAGNOSIS — B952 Enterococcus as the cause of diseases classified elsewhere: Secondary | ICD-10-CM | POA: Diagnosis present

## 2018-03-16 DIAGNOSIS — I340 Nonrheumatic mitral (valve) insufficiency: Secondary | ICD-10-CM | POA: Diagnosis present

## 2018-03-16 DIAGNOSIS — I5022 Chronic systolic (congestive) heart failure: Secondary | ICD-10-CM | POA: Diagnosis present

## 2018-03-16 DIAGNOSIS — E7800 Pure hypercholesterolemia, unspecified: Secondary | ICD-10-CM | POA: Diagnosis present

## 2018-03-16 DIAGNOSIS — M6281 Muscle weakness (generalized): Secondary | ICD-10-CM | POA: Diagnosis not present

## 2018-03-16 DIAGNOSIS — N179 Acute kidney failure, unspecified: Secondary | ICD-10-CM | POA: Diagnosis present

## 2018-03-16 DIAGNOSIS — I252 Old myocardial infarction: Secondary | ICD-10-CM | POA: Diagnosis not present

## 2018-03-16 DIAGNOSIS — Z882 Allergy status to sulfonamides status: Secondary | ICD-10-CM | POA: Diagnosis not present

## 2018-03-16 DIAGNOSIS — I130 Hypertensive heart and chronic kidney disease with heart failure and stage 1 through stage 4 chronic kidney disease, or unspecified chronic kidney disease: Secondary | ICD-10-CM | POA: Diagnosis not present

## 2018-03-16 DIAGNOSIS — Z86711 Personal history of pulmonary embolism: Secondary | ICD-10-CM | POA: Diagnosis not present

## 2018-03-16 DIAGNOSIS — R296 Repeated falls: Secondary | ICD-10-CM | POA: Diagnosis not present

## 2018-03-16 DIAGNOSIS — I255 Ischemic cardiomyopathy: Secondary | ICD-10-CM | POA: Diagnosis present

## 2018-03-16 DIAGNOSIS — J189 Pneumonia, unspecified organism: Secondary | ICD-10-CM | POA: Diagnosis not present

## 2018-03-16 DIAGNOSIS — I6529 Occlusion and stenosis of unspecified carotid artery: Secondary | ICD-10-CM | POA: Diagnosis present

## 2018-03-16 DIAGNOSIS — R2689 Other abnormalities of gait and mobility: Secondary | ICD-10-CM | POA: Diagnosis not present

## 2018-03-16 DIAGNOSIS — R531 Weakness: Secondary | ICD-10-CM | POA: Diagnosis present

## 2018-03-16 DIAGNOSIS — R41841 Cognitive communication deficit: Secondary | ICD-10-CM | POA: Diagnosis not present

## 2018-03-16 DIAGNOSIS — N189 Chronic kidney disease, unspecified: Secondary | ICD-10-CM | POA: Diagnosis present

## 2018-03-19 DIAGNOSIS — M6281 Muscle weakness (generalized): Secondary | ICD-10-CM | POA: Diagnosis not present

## 2018-03-19 DIAGNOSIS — R110 Nausea: Secondary | ICD-10-CM | POA: Diagnosis not present

## 2018-03-19 DIAGNOSIS — N390 Urinary tract infection, site not specified: Secondary | ICD-10-CM | POA: Diagnosis present

## 2018-03-19 DIAGNOSIS — R41841 Cognitive communication deficit: Secondary | ICD-10-CM | POA: Diagnosis not present

## 2018-03-19 DIAGNOSIS — R109 Unspecified abdominal pain: Secondary | ICD-10-CM | POA: Diagnosis not present

## 2018-03-19 DIAGNOSIS — E785 Hyperlipidemia, unspecified: Secondary | ICD-10-CM | POA: Diagnosis not present

## 2018-03-19 DIAGNOSIS — E7800 Pure hypercholesterolemia, unspecified: Secondary | ICD-10-CM | POA: Diagnosis present

## 2018-03-19 DIAGNOSIS — N189 Chronic kidney disease, unspecified: Secondary | ICD-10-CM | POA: Diagnosis present

## 2018-03-19 DIAGNOSIS — R600 Localized edema: Secondary | ICD-10-CM | POA: Diagnosis not present

## 2018-03-19 DIAGNOSIS — N179 Acute kidney failure, unspecified: Secondary | ICD-10-CM | POA: Diagnosis present

## 2018-03-19 DIAGNOSIS — R296 Repeated falls: Secondary | ICD-10-CM | POA: Diagnosis not present

## 2018-03-19 DIAGNOSIS — R609 Edema, unspecified: Secondary | ICD-10-CM | POA: Diagnosis not present

## 2018-03-19 DIAGNOSIS — Z86711 Personal history of pulmonary embolism: Secondary | ICD-10-CM | POA: Diagnosis not present

## 2018-03-19 DIAGNOSIS — I130 Hypertensive heart and chronic kidney disease with heart failure and stage 1 through stage 4 chronic kidney disease, or unspecified chronic kidney disease: Secondary | ICD-10-CM | POA: Diagnosis present

## 2018-03-19 DIAGNOSIS — J90 Pleural effusion, not elsewhere classified: Secondary | ICD-10-CM | POA: Diagnosis not present

## 2018-03-19 DIAGNOSIS — R0602 Shortness of breath: Secondary | ICD-10-CM | POA: Diagnosis not present

## 2018-03-19 DIAGNOSIS — B952 Enterococcus as the cause of diseases classified elsewhere: Secondary | ICD-10-CM | POA: Diagnosis not present

## 2018-03-19 DIAGNOSIS — K219 Gastro-esophageal reflux disease without esophagitis: Secondary | ICD-10-CM | POA: Diagnosis present

## 2018-03-19 DIAGNOSIS — I509 Heart failure, unspecified: Secondary | ICD-10-CM | POA: Diagnosis not present

## 2018-03-19 DIAGNOSIS — B961 Klebsiella pneumoniae [K. pneumoniae] as the cause of diseases classified elsewhere: Secondary | ICD-10-CM | POA: Diagnosis present

## 2018-03-19 DIAGNOSIS — I252 Old myocardial infarction: Secondary | ICD-10-CM | POA: Diagnosis not present

## 2018-03-19 DIAGNOSIS — I255 Ischemic cardiomyopathy: Secondary | ICD-10-CM | POA: Diagnosis present

## 2018-03-19 DIAGNOSIS — Z955 Presence of coronary angioplasty implant and graft: Secondary | ICD-10-CM | POA: Diagnosis not present

## 2018-03-19 DIAGNOSIS — I5022 Chronic systolic (congestive) heart failure: Secondary | ICD-10-CM | POA: Diagnosis not present

## 2018-03-19 DIAGNOSIS — J189 Pneumonia, unspecified organism: Secondary | ICD-10-CM | POA: Diagnosis not present

## 2018-03-19 DIAGNOSIS — R2689 Other abnormalities of gait and mobility: Secondary | ICD-10-CM | POA: Diagnosis not present

## 2018-03-19 DIAGNOSIS — R531 Weakness: Secondary | ICD-10-CM | POA: Diagnosis not present

## 2018-03-19 DIAGNOSIS — I5023 Acute on chronic systolic (congestive) heart failure: Secondary | ICD-10-CM | POA: Diagnosis present

## 2018-03-19 DIAGNOSIS — I2510 Atherosclerotic heart disease of native coronary artery without angina pectoris: Secondary | ICD-10-CM | POA: Diagnosis present

## 2018-03-19 DIAGNOSIS — Z9181 History of falling: Secondary | ICD-10-CM | POA: Diagnosis not present

## 2018-03-20 DIAGNOSIS — M6281 Muscle weakness (generalized): Secondary | ICD-10-CM | POA: Diagnosis not present

## 2018-03-20 DIAGNOSIS — N390 Urinary tract infection, site not specified: Secondary | ICD-10-CM | POA: Diagnosis not present

## 2018-04-11 DIAGNOSIS — R531 Weakness: Secondary | ICD-10-CM | POA: Diagnosis not present

## 2018-04-11 DIAGNOSIS — R110 Nausea: Secondary | ICD-10-CM | POA: Diagnosis not present

## 2018-04-28 DIAGNOSIS — I509 Heart failure, unspecified: Secondary | ICD-10-CM | POA: Diagnosis not present

## 2018-04-28 DIAGNOSIS — E785 Hyperlipidemia, unspecified: Secondary | ICD-10-CM | POA: Diagnosis not present

## 2018-04-28 DIAGNOSIS — N189 Chronic kidney disease, unspecified: Secondary | ICD-10-CM | POA: Diagnosis not present

## 2018-04-29 ENCOUNTER — Ambulatory Visit: Payer: Medicare Other | Admitting: Cardiovascular Disease

## 2018-05-01 DIAGNOSIS — I509 Heart failure, unspecified: Secondary | ICD-10-CM | POA: Diagnosis not present

## 2018-05-01 DIAGNOSIS — R600 Localized edema: Secondary | ICD-10-CM | POA: Diagnosis not present

## 2018-05-07 DIAGNOSIS — I5023 Acute on chronic systolic (congestive) heart failure: Secondary | ICD-10-CM | POA: Diagnosis present

## 2018-05-07 DIAGNOSIS — R5381 Other malaise: Secondary | ICD-10-CM | POA: Diagnosis not present

## 2018-05-07 DIAGNOSIS — J90 Pleural effusion, not elsewhere classified: Secondary | ICD-10-CM | POA: Diagnosis not present

## 2018-05-07 DIAGNOSIS — R2689 Other abnormalities of gait and mobility: Secondary | ICD-10-CM | POA: Diagnosis not present

## 2018-05-07 DIAGNOSIS — N390 Urinary tract infection, site not specified: Secondary | ICD-10-CM | POA: Diagnosis present

## 2018-05-07 DIAGNOSIS — I255 Ischemic cardiomyopathy: Secondary | ICD-10-CM | POA: Diagnosis present

## 2018-05-07 DIAGNOSIS — R609 Edema, unspecified: Secondary | ICD-10-CM | POA: Diagnosis not present

## 2018-05-07 DIAGNOSIS — I130 Hypertensive heart and chronic kidney disease with heart failure and stage 1 through stage 4 chronic kidney disease, or unspecified chronic kidney disease: Secondary | ICD-10-CM | POA: Diagnosis not present

## 2018-05-07 DIAGNOSIS — N179 Acute kidney failure, unspecified: Secondary | ICD-10-CM | POA: Diagnosis not present

## 2018-05-07 DIAGNOSIS — E7800 Pure hypercholesterolemia, unspecified: Secondary | ICD-10-CM | POA: Diagnosis present

## 2018-05-07 DIAGNOSIS — I371 Nonrheumatic pulmonary valve insufficiency: Secondary | ICD-10-CM | POA: Diagnosis not present

## 2018-05-07 DIAGNOSIS — M2550 Pain in unspecified joint: Secondary | ICD-10-CM | POA: Diagnosis not present

## 2018-05-07 DIAGNOSIS — R41841 Cognitive communication deficit: Secondary | ICD-10-CM | POA: Diagnosis not present

## 2018-05-07 DIAGNOSIS — M6281 Muscle weakness (generalized): Secondary | ICD-10-CM | POA: Diagnosis not present

## 2018-05-07 DIAGNOSIS — I252 Old myocardial infarction: Secondary | ICD-10-CM | POA: Diagnosis not present

## 2018-05-07 DIAGNOSIS — B961 Klebsiella pneumoniae [K. pneumoniae] as the cause of diseases classified elsewhere: Secondary | ICD-10-CM | POA: Diagnosis not present

## 2018-05-07 DIAGNOSIS — Z7401 Bed confinement status: Secondary | ICD-10-CM | POA: Diagnosis not present

## 2018-05-07 DIAGNOSIS — Z86711 Personal history of pulmonary embolism: Secondary | ICD-10-CM | POA: Diagnosis not present

## 2018-05-07 DIAGNOSIS — K219 Gastro-esophageal reflux disease without esophagitis: Secondary | ICD-10-CM | POA: Diagnosis present

## 2018-05-07 DIAGNOSIS — I517 Cardiomegaly: Secondary | ICD-10-CM | POA: Diagnosis not present

## 2018-05-07 DIAGNOSIS — R0602 Shortness of breath: Secondary | ICD-10-CM | POA: Diagnosis not present

## 2018-05-07 DIAGNOSIS — I509 Heart failure, unspecified: Secondary | ICD-10-CM | POA: Diagnosis not present

## 2018-05-07 DIAGNOSIS — N189 Chronic kidney disease, unspecified: Secondary | ICD-10-CM | POA: Diagnosis not present

## 2018-05-07 DIAGNOSIS — I361 Nonrheumatic tricuspid (valve) insufficiency: Secondary | ICD-10-CM | POA: Diagnosis not present

## 2018-05-07 DIAGNOSIS — Z9181 History of falling: Secondary | ICD-10-CM | POA: Diagnosis not present

## 2018-05-07 DIAGNOSIS — Z955 Presence of coronary angioplasty implant and graft: Secondary | ICD-10-CM | POA: Diagnosis not present

## 2018-05-07 DIAGNOSIS — I2510 Atherosclerotic heart disease of native coronary artery without angina pectoris: Secondary | ICD-10-CM | POA: Diagnosis present

## 2018-05-08 DIAGNOSIS — I509 Heart failure, unspecified: Secondary | ICD-10-CM | POA: Diagnosis not present

## 2018-05-08 DIAGNOSIS — I517 Cardiomegaly: Secondary | ICD-10-CM | POA: Diagnosis not present

## 2018-05-08 DIAGNOSIS — I371 Nonrheumatic pulmonary valve insufficiency: Secondary | ICD-10-CM | POA: Diagnosis not present

## 2018-05-08 DIAGNOSIS — I361 Nonrheumatic tricuspid (valve) insufficiency: Secondary | ICD-10-CM | POA: Diagnosis not present

## 2018-05-15 DIAGNOSIS — S22088D Other fracture of T11-T12 vertebra, subsequent encounter for fracture with routine healing: Secondary | ICD-10-CM | POA: Diagnosis not present

## 2018-05-15 DIAGNOSIS — M4854XA Collapsed vertebra, not elsewhere classified, thoracic region, initial encounter for fracture: Secondary | ICD-10-CM | POA: Diagnosis present

## 2018-05-15 DIAGNOSIS — J9601 Acute respiratory failure with hypoxia: Secondary | ICD-10-CM | POA: Diagnosis not present

## 2018-05-15 DIAGNOSIS — Z66 Do not resuscitate: Secondary | ICD-10-CM | POA: Diagnosis present

## 2018-05-15 DIAGNOSIS — I110 Hypertensive heart disease with heart failure: Secondary | ICD-10-CM | POA: Diagnosis not present

## 2018-05-15 DIAGNOSIS — Z951 Presence of aortocoronary bypass graft: Secondary | ICD-10-CM | POA: Diagnosis not present

## 2018-05-15 DIAGNOSIS — I340 Nonrheumatic mitral (valve) insufficiency: Secondary | ICD-10-CM | POA: Diagnosis not present

## 2018-05-15 DIAGNOSIS — Z9289 Personal history of other medical treatment: Secondary | ICD-10-CM | POA: Diagnosis not present

## 2018-05-15 DIAGNOSIS — N390 Urinary tract infection, site not specified: Secondary | ICD-10-CM | POA: Diagnosis not present

## 2018-05-15 DIAGNOSIS — I491 Atrial premature depolarization: Secondary | ICD-10-CM | POA: Diagnosis present

## 2018-05-15 DIAGNOSIS — I25708 Atherosclerosis of coronary artery bypass graft(s), unspecified, with other forms of angina pectoris: Secondary | ICD-10-CM | POA: Diagnosis not present

## 2018-05-15 DIAGNOSIS — M2550 Pain in unspecified joint: Secondary | ICD-10-CM | POA: Diagnosis not present

## 2018-05-15 DIAGNOSIS — H9190 Unspecified hearing loss, unspecified ear: Secondary | ICD-10-CM | POA: Diagnosis present

## 2018-05-15 DIAGNOSIS — I2510 Atherosclerotic heart disease of native coronary artery without angina pectoris: Secondary | ICD-10-CM | POA: Diagnosis present

## 2018-05-15 DIAGNOSIS — R5381 Other malaise: Secondary | ICD-10-CM | POA: Diagnosis not present

## 2018-05-15 DIAGNOSIS — M7989 Other specified soft tissue disorders: Secondary | ICD-10-CM | POA: Diagnosis present

## 2018-05-15 DIAGNOSIS — J90 Pleural effusion, not elsewhere classified: Secondary | ICD-10-CM | POA: Diagnosis not present

## 2018-05-15 DIAGNOSIS — Z9889 Other specified postprocedural states: Secondary | ICD-10-CM | POA: Diagnosis not present

## 2018-05-15 DIAGNOSIS — M810 Age-related osteoporosis without current pathological fracture: Secondary | ICD-10-CM | POA: Diagnosis present

## 2018-05-15 DIAGNOSIS — R2689 Other abnormalities of gait and mobility: Secondary | ICD-10-CM | POA: Diagnosis not present

## 2018-05-15 DIAGNOSIS — I5023 Acute on chronic systolic (congestive) heart failure: Secondary | ICD-10-CM | POA: Diagnosis not present

## 2018-05-15 DIAGNOSIS — Z7401 Bed confinement status: Secondary | ICD-10-CM | POA: Diagnosis not present

## 2018-05-15 DIAGNOSIS — M6281 Muscle weakness (generalized): Secondary | ICD-10-CM | POA: Diagnosis not present

## 2018-05-15 DIAGNOSIS — I2699 Other pulmonary embolism without acute cor pulmonale: Secondary | ICD-10-CM | POA: Diagnosis not present

## 2018-05-15 DIAGNOSIS — I272 Pulmonary hypertension, unspecified: Secondary | ICD-10-CM | POA: Diagnosis present

## 2018-05-15 DIAGNOSIS — I4892 Unspecified atrial flutter: Secondary | ICD-10-CM | POA: Diagnosis present

## 2018-05-15 DIAGNOSIS — R531 Weakness: Secondary | ICD-10-CM | POA: Diagnosis not present

## 2018-05-15 DIAGNOSIS — S22080A Wedge compression fracture of T11-T12 vertebra, initial encounter for closed fracture: Secondary | ICD-10-CM | POA: Diagnosis not present

## 2018-05-15 DIAGNOSIS — N183 Chronic kidney disease, stage 3 (moderate): Secondary | ICD-10-CM | POA: Diagnosis present

## 2018-05-15 DIAGNOSIS — N179 Acute kidney failure, unspecified: Secondary | ICD-10-CM | POA: Diagnosis present

## 2018-05-15 DIAGNOSIS — K219 Gastro-esophageal reflux disease without esophagitis: Secondary | ICD-10-CM | POA: Diagnosis present

## 2018-05-15 DIAGNOSIS — I739 Peripheral vascular disease, unspecified: Secondary | ICD-10-CM | POA: Diagnosis present

## 2018-05-15 DIAGNOSIS — L89890 Pressure ulcer of other site, unstageable: Secondary | ICD-10-CM | POA: Diagnosis present

## 2018-05-15 DIAGNOSIS — I480 Paroxysmal atrial fibrillation: Secondary | ICD-10-CM | POA: Diagnosis present

## 2018-05-15 DIAGNOSIS — I361 Nonrheumatic tricuspid (valve) insufficiency: Secondary | ICD-10-CM | POA: Diagnosis not present

## 2018-05-15 DIAGNOSIS — R0902 Hypoxemia: Secondary | ICD-10-CM | POA: Diagnosis present

## 2018-05-15 DIAGNOSIS — I5043 Acute on chronic combined systolic (congestive) and diastolic (congestive) heart failure: Secondary | ICD-10-CM | POA: Diagnosis present

## 2018-05-15 DIAGNOSIS — I10 Essential (primary) hypertension: Secondary | ICD-10-CM | POA: Diagnosis not present

## 2018-05-15 DIAGNOSIS — E785 Hyperlipidemia, unspecified: Secondary | ICD-10-CM | POA: Diagnosis present

## 2018-05-15 DIAGNOSIS — I5082 Biventricular heart failure: Secondary | ICD-10-CM | POA: Diagnosis present

## 2018-05-15 DIAGNOSIS — L89899 Pressure ulcer of other site, unspecified stage: Secondary | ICD-10-CM | POA: Diagnosis not present

## 2018-05-15 DIAGNOSIS — E46 Unspecified protein-calorie malnutrition: Secondary | ICD-10-CM | POA: Diagnosis present

## 2018-05-15 DIAGNOSIS — R197 Diarrhea, unspecified: Secondary | ICD-10-CM | POA: Diagnosis present

## 2018-05-15 DIAGNOSIS — I4510 Unspecified right bundle-branch block: Secondary | ICD-10-CM | POA: Diagnosis present

## 2018-05-15 DIAGNOSIS — I130 Hypertensive heart and chronic kidney disease with heart failure and stage 1 through stage 4 chronic kidney disease, or unspecified chronic kidney disease: Secondary | ICD-10-CM | POA: Diagnosis present

## 2018-05-15 DIAGNOSIS — R600 Localized edema: Secondary | ICD-10-CM | POA: Diagnosis not present

## 2018-05-15 DIAGNOSIS — I519 Heart disease, unspecified: Secondary | ICD-10-CM | POA: Diagnosis not present

## 2018-05-15 DIAGNOSIS — R41841 Cognitive communication deficit: Secondary | ICD-10-CM | POA: Diagnosis not present

## 2018-05-15 DIAGNOSIS — I5033 Acute on chronic diastolic (congestive) heart failure: Secondary | ICD-10-CM | POA: Diagnosis not present

## 2018-05-17 DIAGNOSIS — I5043 Acute on chronic combined systolic (congestive) and diastolic (congestive) heart failure: Secondary | ICD-10-CM | POA: Diagnosis not present

## 2018-05-17 DIAGNOSIS — N390 Urinary tract infection, site not specified: Secondary | ICD-10-CM | POA: Diagnosis not present

## 2018-05-22 ENCOUNTER — Encounter: Payer: Self-pay | Admitting: Cardiovascular Disease

## 2018-05-22 ENCOUNTER — Inpatient Hospital Stay (HOSPITAL_COMMUNITY)
Admission: EM | Admit: 2018-05-22 | Discharge: 2018-05-29 | DRG: 291 | Disposition: A | Discharge status: Skilled Nursing Facility | Payer: Medicare Other | Attending: Internal Medicine | Admitting: Internal Medicine

## 2018-05-22 ENCOUNTER — Emergency Department (HOSPITAL_COMMUNITY): Payer: Medicare Other

## 2018-05-22 ENCOUNTER — Other Ambulatory Visit: Payer: Self-pay

## 2018-05-22 ENCOUNTER — Ambulatory Visit (INDEPENDENT_AMBULATORY_CARE_PROVIDER_SITE_OTHER): Payer: Medicare Other | Admitting: Cardiovascular Disease

## 2018-05-22 ENCOUNTER — Encounter: Payer: Self-pay | Admitting: *Deleted

## 2018-05-22 VITALS — BP 132/72 | HR 73 | Ht 61.0 in | Wt 136.0 lb

## 2018-05-22 DIAGNOSIS — I5082 Biventricular heart failure: Secondary | ICD-10-CM | POA: Diagnosis present

## 2018-05-22 DIAGNOSIS — Z888 Allergy status to other drugs, medicaments and biological substances status: Secondary | ICD-10-CM

## 2018-05-22 DIAGNOSIS — S22088D Other fracture of T11-T12 vertebra, subsequent encounter for fracture with routine healing: Secondary | ICD-10-CM | POA: Diagnosis not present

## 2018-05-22 DIAGNOSIS — R197 Diarrhea, unspecified: Secondary | ICD-10-CM | POA: Diagnosis present

## 2018-05-22 DIAGNOSIS — I2510 Atherosclerotic heart disease of native coronary artery without angina pectoris: Secondary | ICD-10-CM | POA: Diagnosis present

## 2018-05-22 DIAGNOSIS — I491 Atrial premature depolarization: Secondary | ICD-10-CM | POA: Diagnosis present

## 2018-05-22 DIAGNOSIS — E46 Unspecified protein-calorie malnutrition: Secondary | ICD-10-CM | POA: Diagnosis present

## 2018-05-22 DIAGNOSIS — N179 Acute kidney failure, unspecified: Secondary | ICD-10-CM | POA: Diagnosis present

## 2018-05-22 DIAGNOSIS — I10 Essential (primary) hypertension: Secondary | ICD-10-CM | POA: Diagnosis not present

## 2018-05-22 DIAGNOSIS — I5023 Acute on chronic systolic (congestive) heart failure: Secondary | ICD-10-CM

## 2018-05-22 DIAGNOSIS — K219 Gastro-esophageal reflux disease without esophagitis: Secondary | ICD-10-CM | POA: Diagnosis present

## 2018-05-22 DIAGNOSIS — I5033 Acute on chronic diastolic (congestive) heart failure: Secondary | ICD-10-CM | POA: Diagnosis not present

## 2018-05-22 DIAGNOSIS — Z66 Do not resuscitate: Secondary | ICD-10-CM | POA: Diagnosis present

## 2018-05-22 DIAGNOSIS — I480 Paroxysmal atrial fibrillation: Secondary | ICD-10-CM | POA: Diagnosis present

## 2018-05-22 DIAGNOSIS — M4854XA Collapsed vertebra, not elsewhere classified, thoracic region, initial encounter for fracture: Secondary | ICD-10-CM | POA: Diagnosis present

## 2018-05-22 DIAGNOSIS — I130 Hypertensive heart and chronic kidney disease with heart failure and stage 1 through stage 4 chronic kidney disease, or unspecified chronic kidney disease: Principal | ICD-10-CM | POA: Diagnosis present

## 2018-05-22 DIAGNOSIS — L89899 Pressure ulcer of other site, unspecified stage: Secondary | ICD-10-CM | POA: Diagnosis not present

## 2018-05-22 DIAGNOSIS — M810 Age-related osteoporosis without current pathological fracture: Secondary | ICD-10-CM | POA: Diagnosis present

## 2018-05-22 DIAGNOSIS — I739 Peripheral vascular disease, unspecified: Secondary | ICD-10-CM | POA: Diagnosis present

## 2018-05-22 DIAGNOSIS — M6281 Muscle weakness (generalized): Secondary | ICD-10-CM | POA: Diagnosis not present

## 2018-05-22 DIAGNOSIS — Z79899 Other long term (current) drug therapy: Secondary | ICD-10-CM

## 2018-05-22 DIAGNOSIS — N183 Chronic kidney disease, stage 3 unspecified: Secondary | ICD-10-CM | POA: Diagnosis present

## 2018-05-22 DIAGNOSIS — Z882 Allergy status to sulfonamides status: Secondary | ICD-10-CM

## 2018-05-22 DIAGNOSIS — E785 Hyperlipidemia, unspecified: Secondary | ICD-10-CM | POA: Diagnosis present

## 2018-05-22 DIAGNOSIS — Z9289 Personal history of other medical treatment: Secondary | ICD-10-CM | POA: Diagnosis not present

## 2018-05-22 DIAGNOSIS — I2699 Other pulmonary embolism without acute cor pulmonale: Secondary | ICD-10-CM | POA: Diagnosis not present

## 2018-05-22 DIAGNOSIS — Z86711 Personal history of pulmonary embolism: Secondary | ICD-10-CM

## 2018-05-22 DIAGNOSIS — I110 Hypertensive heart disease with heart failure: Secondary | ICD-10-CM | POA: Diagnosis not present

## 2018-05-22 DIAGNOSIS — R5381 Other malaise: Secondary | ICD-10-CM | POA: Diagnosis not present

## 2018-05-22 DIAGNOSIS — Z8249 Family history of ischemic heart disease and other diseases of the circulatory system: Secondary | ICD-10-CM

## 2018-05-22 DIAGNOSIS — S22080A Wedge compression fracture of T11-T12 vertebra, initial encounter for closed fracture: Secondary | ICD-10-CM | POA: Diagnosis present

## 2018-05-22 DIAGNOSIS — M7989 Other specified soft tissue disorders: Secondary | ICD-10-CM | POA: Diagnosis present

## 2018-05-22 DIAGNOSIS — R41841 Cognitive communication deficit: Secondary | ICD-10-CM | POA: Diagnosis not present

## 2018-05-22 DIAGNOSIS — I4510 Unspecified right bundle-branch block: Secondary | ICD-10-CM | POA: Diagnosis present

## 2018-05-22 DIAGNOSIS — H9190 Unspecified hearing loss, unspecified ear: Secondary | ICD-10-CM | POA: Diagnosis present

## 2018-05-22 DIAGNOSIS — R0902 Hypoxemia: Secondary | ICD-10-CM | POA: Diagnosis present

## 2018-05-22 DIAGNOSIS — R531 Weakness: Secondary | ICD-10-CM | POA: Diagnosis not present

## 2018-05-22 DIAGNOSIS — I5043 Acute on chronic combined systolic (congestive) and diastolic (congestive) heart failure: Secondary | ICD-10-CM

## 2018-05-22 DIAGNOSIS — I340 Nonrheumatic mitral (valve) insufficiency: Secondary | ICD-10-CM | POA: Diagnosis not present

## 2018-05-22 DIAGNOSIS — Z7901 Long term (current) use of anticoagulants: Secondary | ICD-10-CM

## 2018-05-22 DIAGNOSIS — M2550 Pain in unspecified joint: Secondary | ICD-10-CM | POA: Diagnosis not present

## 2018-05-22 DIAGNOSIS — R600 Localized edema: Secondary | ICD-10-CM

## 2018-05-22 DIAGNOSIS — I272 Pulmonary hypertension, unspecified: Secondary | ICD-10-CM | POA: Diagnosis present

## 2018-05-22 DIAGNOSIS — I519 Heart disease, unspecified: Secondary | ICD-10-CM

## 2018-05-22 DIAGNOSIS — I509 Heart failure, unspecified: Secondary | ICD-10-CM

## 2018-05-22 DIAGNOSIS — I25708 Atherosclerosis of coronary artery bypass graft(s), unspecified, with other forms of angina pectoris: Secondary | ICD-10-CM

## 2018-05-22 DIAGNOSIS — Z9889 Other specified postprocedural states: Secondary | ICD-10-CM | POA: Diagnosis not present

## 2018-05-22 DIAGNOSIS — I4892 Unspecified atrial flutter: Secondary | ICD-10-CM | POA: Diagnosis present

## 2018-05-22 DIAGNOSIS — E43 Unspecified severe protein-calorie malnutrition: Secondary | ICD-10-CM

## 2018-05-22 DIAGNOSIS — I361 Nonrheumatic tricuspid (valve) insufficiency: Secondary | ICD-10-CM | POA: Diagnosis not present

## 2018-05-22 DIAGNOSIS — Z951 Presence of aortocoronary bypass graft: Secondary | ICD-10-CM | POA: Diagnosis not present

## 2018-05-22 DIAGNOSIS — L8990 Pressure ulcer of unspecified site, unspecified stage: Secondary | ICD-10-CM

## 2018-05-22 DIAGNOSIS — L89890 Pressure ulcer of other site, unstageable: Secondary | ICD-10-CM | POA: Diagnosis present

## 2018-05-22 DIAGNOSIS — N390 Urinary tract infection, site not specified: Secondary | ICD-10-CM | POA: Diagnosis not present

## 2018-05-22 DIAGNOSIS — I252 Old myocardial infarction: Secondary | ICD-10-CM

## 2018-05-22 DIAGNOSIS — R2689 Other abnormalities of gait and mobility: Secondary | ICD-10-CM | POA: Diagnosis not present

## 2018-05-22 DIAGNOSIS — J90 Pleural effusion, not elsewhere classified: Secondary | ICD-10-CM | POA: Diagnosis not present

## 2018-05-22 DIAGNOSIS — Z6822 Body mass index (BMI) 22.0-22.9, adult: Secondary | ICD-10-CM

## 2018-05-22 DIAGNOSIS — J9601 Acute respiratory failure with hypoxia: Secondary | ICD-10-CM | POA: Diagnosis not present

## 2018-05-22 LAB — COMPREHENSIVE METABOLIC PANEL
ALBUMIN: 2.7 g/dL — AB (ref 3.5–5.0)
ALT: 26 U/L (ref 0–44)
AST: 37 U/L (ref 15–41)
Alkaline Phosphatase: 81 U/L (ref 38–126)
Anion gap: 13 (ref 5–15)
BUN: 18 mg/dL (ref 8–23)
CO2: 33 mmol/L — ABNORMAL HIGH (ref 22–32)
Calcium: 8.3 mg/dL — ABNORMAL LOW (ref 8.9–10.3)
Chloride: 94 mmol/L — ABNORMAL LOW (ref 98–111)
Creatinine, Ser: 1.45 mg/dL — ABNORMAL HIGH (ref 0.44–1.00)
GFR calc Af Amer: 38 mL/min — ABNORMAL LOW (ref >60)
GFR calc non Af Amer: 33 mL/min — ABNORMAL LOW (ref >60)
GLUCOSE: 115 mg/dL — AB (ref 70–99)
POTASSIUM: 4 mmol/L (ref 3.5–5.1)
SODIUM: 140 mmol/L (ref 135–145)
Total Bilirubin: 0.8 mg/dL (ref 0.3–1.2)
Total Protein: 6.8 g/dL (ref 6.5–8.1)

## 2018-05-22 LAB — CBC WITH DIFFERENTIAL/PLATELET
Abs Immature Granulocytes: 0.01 10*3/uL (ref 0.00–0.07)
BASOS PCT: 0 %
Basophils Absolute: 0 10*3/uL (ref 0.0–0.1)
Eosinophils Absolute: 0.1 10*3/uL (ref 0.0–0.5)
Eosinophils Relative: 3 %
HCT: 34.1 % — ABNORMAL LOW (ref 36.0–46.0)
Hemoglobin: 10.3 g/dL — ABNORMAL LOW (ref 12.0–15.0)
Immature Granulocytes: 0 %
Lymphocytes Relative: 21 %
Lymphs Abs: 1 10*3/uL (ref 0.7–4.0)
MCH: 27.8 pg (ref 26.0–34.0)
MCHC: 30.2 g/dL (ref 30.0–36.0)
MCV: 92.2 fL (ref 80.0–100.0)
Monocytes Absolute: 0.4 10*3/uL (ref 0.1–1.0)
Monocytes Relative: 8 %
NRBC: 0 % (ref 0.0–0.2)
Neutro Abs: 3.3 10*3/uL (ref 1.7–7.7)
Neutrophils Relative %: 68 %
Platelets: 141 10*3/uL — ABNORMAL LOW (ref 150–400)
RBC: 3.7 MIL/uL — AB (ref 3.87–5.11)
RDW: 15.5 % (ref 11.5–15.5)
WBC: 4.8 10*3/uL (ref 4.0–10.5)

## 2018-05-22 LAB — BRAIN NATRIURETIC PEPTIDE: B Natriuretic Peptide: 1681.8 pg/mL — ABNORMAL HIGH (ref 0.0–100.0)

## 2018-05-22 LAB — TROPONIN I: Troponin I: 0.05 ng/mL (ref <0.03)

## 2018-05-22 MED ORDER — CARVEDILOL 12.5 MG PO TABS
12.5000 mg | ORAL_TABLET | Freq: Two times a day (BID) | ORAL | Status: DC
  Administered 2018-05-23 – 2018-05-28 (×12): 12.5 mg via ORAL
  Filled 2018-05-22 (×12): qty 1

## 2018-05-22 MED ORDER — ENOXAPARIN SODIUM 30 MG/0.3ML ~~LOC~~ SOLN
30.0000 mg | SUBCUTANEOUS | Status: DC
  Administered 2018-05-22 – 2018-05-28 (×7): 30 mg via SUBCUTANEOUS
  Filled 2018-05-22 (×8): qty 0.3

## 2018-05-22 MED ORDER — FUROSEMIDE 10 MG/ML IJ SOLN
60.0000 mg | Freq: Once | INTRAMUSCULAR | Status: AC
  Administered 2018-05-22: 60 mg via INTRAVENOUS
  Filled 2018-05-22: qty 6

## 2018-05-22 MED ORDER — SODIUM CHLORIDE 0.9% FLUSH
3.0000 mL | Freq: Two times a day (BID) | INTRAVENOUS | Status: DC
  Administered 2018-05-22 – 2018-05-29 (×14): 3 mL via INTRAVENOUS

## 2018-05-22 MED ORDER — PANTOPRAZOLE SODIUM 40 MG PO TBEC
40.0000 mg | DELAYED_RELEASE_TABLET | Freq: Two times a day (BID) | ORAL | Status: DC
  Administered 2018-05-23 – 2018-05-29 (×13): 40 mg via ORAL
  Filled 2018-05-22 (×12): qty 1
  Filled 2018-05-22: qty 2
  Filled 2018-05-22: qty 1

## 2018-05-22 MED ORDER — HYDROCODONE-ACETAMINOPHEN 5-325 MG PO TABS: 1.0000 | ORAL_TABLET | ORAL | Status: DC | PRN

## 2018-05-22 MED ORDER — FUROSEMIDE 10 MG/ML IJ SOLN
40.0000 mg | Freq: Two times a day (BID) | INTRAMUSCULAR | Status: DC
  Administered 2018-05-23 – 2018-05-25 (×5): 40 mg via INTRAVENOUS
  Filled 2018-05-22 (×6): qty 4

## 2018-05-22 MED ORDER — ONDANSETRON HCL 4 MG PO TABS: 4.0000 mg | ORAL_TABLET | Freq: Four times a day (QID) | ORAL | Status: DC | PRN

## 2018-05-22 MED ORDER — ACETAMINOPHEN 325 MG PO TABS: 650.0000 mg | ORAL_TABLET | Freq: Four times a day (QID) | ORAL | Status: DC | PRN

## 2018-05-22 MED ORDER — SODIUM CHLORIDE 0.9% FLUSH
3.0000 mL | INTRAVENOUS | Status: DC | PRN
  Administered 2018-05-23: 3 mL via INTRAVENOUS
  Filled 2018-05-22: qty 3

## 2018-05-22 MED ORDER — MIRTAZAPINE 15 MG PO TABS
15.0000 mg | ORAL_TABLET | Freq: Every day | ORAL | Status: DC
  Administered 2018-05-22 – 2018-05-28 (×7): 15 mg via ORAL
  Filled 2018-05-22 (×7): qty 1

## 2018-05-22 MED ORDER — ONDANSETRON HCL 4 MG/2ML IJ SOLN: 4.0000 mg | Freq: Four times a day (QID) | INTRAMUSCULAR | Status: DC | PRN

## 2018-05-22 MED ORDER — ATORVASTATIN CALCIUM 40 MG PO TABS
40.0000 mg | ORAL_TABLET | Freq: Every day | ORAL | Status: DC
  Administered 2018-05-22 – 2018-05-28 (×7): 40 mg via ORAL
  Filled 2018-05-22 (×7): qty 1

## 2018-05-22 MED ORDER — ACETAMINOPHEN 650 MG RE SUPP: 650.0000 mg | Freq: Four times a day (QID) | RECTAL | Status: DC | PRN

## 2018-05-22 MED ORDER — SODIUM CHLORIDE 0.9 % IV SOLN: 250.0000 mL | INTRAVENOUS | Status: DC | PRN

## 2018-05-22 NOTE — ED Provider Notes (Addendum)
[facility] EMERGENCY DEPARTMENT Provider Note   CSN: [id number] Arrival date & time: 05/22/18  1700    History   Chief Complaint Chief Complaint  Patient presents with  . Leg Swelling    HPI [name] is a 85 y.o. female.     Patient with vascular disease history, heart attack, congestive heart failure, takes Lasix daily 80 mg twice daily presents with worsening shortness of breath, weight gain and leg edema for the past 3 weeks.  Patient says approximately 20 pounds of weight gain.  Worse with exertion.     Past Medical History:  Diagnosis Date  . Arthritis    back  . Carotid artery disease (HCC)    followed by vascular surgery  Carotid duplex in 02/2010-60-79% right proximal internal carotid artery stenosis; plaque without focal stenosis on the left; no change compared with 08/30/2009.  . Cataract    bilateral   . CHF (congestive heart failure) (HCC)   . Claustrophobia   . Coronary artery disease    Acute anteroseptal MI treated with urgent PCI and followed by CABG surgery for left main and severe three-vessel coronary disease in 07/2010; prior percutaneous intervention in 05/2007 with DES to the proximal LAD; RCA stent in 09/2009; both stents occluded as well as M1 prior to surgery  . Coronary atherosclerosis of native coronary artery    echocardiogram June 2012 ejection fraction 40% with apical and anteroseptal akinesis with scarring.  . Creatinine elevation   . Edema   . Fall at home Oct. 28, 2016   Fx Right Hip  . GERD (gastroesophageal reflux disease)   . Hemothorax on left   . HOH (hard of hearing)   . Myocardial infarction of anterior wall greater than eight weeks ago    large anterior wall myocardial infarction status post emergent PCI followed by coronary bypass grafting.,  Ejection fraction 40%  . Osteoporosis 01/17/2015  . Pneumonia   . Shortness of breath    With ambulation  . Transient atrial fibrillation or flutter    post  myocardial infarction and she remains in normal sinus rhythm  . Unspecified essential hypertension   . Vitamin D deficiency 01/17/2015    Patient Active Problem List   Diagnosis Date Noted  . Vitamin D deficiency 01/17/2015  . Osteoporosis 01/17/2015  . Pelvic fracture (HCC) 01/14/2015  . Chronic systolic heart failure (HCC) 01/14/2015  . CKD (chronic kidney disease), stage III (HCC) 01/14/2015  . Aftercare following surgery of the circulatory system, NEC 01/19/2013  . Preop cardiovascular exam 12/06/2011  . Transient atrial fibrillation or flutter   . Coronary artery disease   . Hemothorax on left   . Anemia 12/26/2010  . Renal insufficiency 12/26/2010  . Ischemic cardiomyopathy 09/13/2010  . Paroxysmal atrial fibrillation (HCC) 09/13/2010  . Arteriosclerotic cardiovascular disease (ASCVD) 08/08/2010  . Dyslipidemia 08/10/2009  . CAROTID ARTERY DISEASE 08/10/2009  . HTN (hypertension) 12/13/2008  . EDEMA 12/13/2008    Past Surgical History:  Procedure Laterality Date  . APPENDECTOMY    . CABGx 3  08/09/10   [name]  . CARDIAC CATHETERIZATION  2001, 2008, 2009, 2012  . CAROTID ENDARTERECTOMY Right 12-13-11   CEA  . COLONOSCOPY  2012  . CORONARY ARTERY BYPASS GRAFT  08/08/2010   Triple bypass  . ENDARTERECTOMY  12/13/2011   Procedure: ENDARTERECTOMY CAROTID;  Surgeon: Larina Earthly, MD;  Location: [facility] OR;  Service: Vascular;  Laterality: Right;  Right Carotid endartectomy with dacron patch angioplasty  .  EYE SURGERY Bilateral 2014   cataract lens implant  . FRACTURE SURGERY Right 01-13-15   Hip Fx - pt fell in home  . HAND SURGERY     right hand  . PR VEIN BYPASS GRAFT,AORTO-FEM-POP  07/29/10  . Sternal wound exploration, evacuation of left hemothorax,  and sternal wou nd closure  08/13/10   Hendrickson  . TONSILLECTOMY       OB History   No obstetric history on file.      Home Medications    Prior to Admission medications   Medication Sig Start Date End Date Taking?  Authorizing Provider  atorvastatin (LIPITOR) 40 MG tablet Take 40 mg by mouth daily.    Yes [provider]  carvedilol (COREG) 12.5 MG tablet Take 12.5 mg by mouth 2 (two) times daily with a meal.  09/28/10  Yes Rothbart, Gerrit Friends, MD  cefUROXime (CEFTIN) 250 MG tablet Take 250 mg by mouth See admin instructions. Take 250 mg by mouth once a day for 7 days, for UTI 05/15/18 05/22/18 Yes [provider]  furosemide (LASIX) 80 MG tablet Take 80 mg by mouth 2 (two) times daily.   Yes [provider]  mirtazapine (REMERON) 15 MG tablet Take 15 mg by mouth at bedtime.   Yes [provider]  pantoprazole (PROTONIX) 40 MG tablet Take 40 mg by mouth 2 (two) times daily.    Yes [provider]  potassium chloride (K-DUR,KLOR-CON) 10 MEQ tablet Take 10 mEq by mouth daily.    Yes [provider]  nitroGLYCERIN (NITROSTAT) 0.4 MG SL tablet Place 0.4 mg under the tongue every 5 (five) minutes as needed for chest pain.     [provider]    Family History Family History  Problem Relation Age of Onset  . Heart failure Father        chf  . Heart disease Father   . Hyperlipidemia Mother   . Hypertension Mother   . Aneurysm Mother        AAA  . Heart disease Mother   . Hyperlipidemia Son   . Hypertension Son   . Heart disease Son   . Peripheral vascular disease Son        AMPUTATION  . Diabetes Son        Amputation Left foot  . Cancer Other   . Coronary artery disease Other   . Hyperlipidemia Sister   . Hypertension Sister   . Heart disease Sister        Heart Disease before age 81  . Heart attack Sister     Social History Social History   Tobacco Use  . Smoking status: Never Smoker  . Smokeless tobacco: Never Used  Substance Use Topics  . Alcohol use: No    Alcohol/week: 0.0 standard drinks  . Drug use: No     Allergies   Lisinopril; Ace inhibitors; Eliquis [apixaban]; Lipitor [atorvastatin]; and Sulfonamide  derivatives   Review of Systems Review of Systems  Constitutional: Negative for chills and fever.  HENT: Negative for congestion.   Eyes: Negative for visual disturbance.  Respiratory: Positive for shortness of breath.   Cardiovascular: Positive for leg swelling. Negative for chest pain.  Gastrointestinal: Negative for abdominal pain and vomiting.  Genitourinary: Negative for dysuria and flank pain.  Musculoskeletal: Negative for back pain, neck pain and neck stiffness.  Skin: Negative for rash.  Neurological: Negative for light-headedness and headaches.     Physical Exam Updated Vital Signs BP  133/89   Pulse 69   Temp 97.8 F (36.6 C) (Oral)   Resp (!) 25   Ht 5\' 1"  (1.549 m)   Wt 61.7 kg   SpO2 100%   BMI 25.69 kg/m   Physical Exam Vitals signs and nursing note reviewed.  Constitutional:      Appearance: She is well-developed.  HENT:     Head: Normocephalic and atraumatic.  Eyes:     General:        Right eye: No discharge.        Left eye: No discharge.     Conjunctiva/sclera: Conjunctivae normal.  Neck:     Musculoskeletal: Normal range of motion and neck supple.     Trachea: No tracheal deviation.  Cardiovascular:     Rate and Rhythm: Normal rate and regular rhythm.  Pulmonary:     Effort: Pulmonary effort is normal.     Breath sounds: Rales present.  Abdominal:     General: There is no distension.     Palpations: Abdomen is soft.     Tenderness: There is no abdominal tenderness. There is no guarding.  Musculoskeletal:        General: Swelling present.     Right lower leg: Edema present.     Left lower leg: Edema present.  Skin:    General: Skin is warm.     Capillary Refill: Capillary refill takes less than 2 seconds.     Findings: No rash.  Neurological:     Mental Status: She is alert and oriented to person, place, and time.  Psychiatric:        Mood and Affect: Mood normal.      ED Treatments / Results  Labs (all labs ordered are  listed, but only abnormal results are displayed) Labs Reviewed  CBC WITH DIFFERENTIAL/PLATELET - Abnormal; Notable for the following components:      Result Value   RBC 3.70 (*)    Hemoglobin 10.3 (*)    HCT 34.1 (*)    Platelets 141 (*)    All other components within normal limits  COMPREHENSIVE METABOLIC PANEL - Abnormal; Notable for the following components:   Chloride 94 (*)    CO2 33 (*)    Glucose, Bld 115 (*)    Creatinine, Ser 1.45 (*)    Calcium 8.3 (*)    Albumin 2.7 (*)    GFR calc non Af Amer 33 (*)    GFR calc Af Amer 38 (*)    All other components within normal limits  BRAIN NATRIURETIC PEPTIDE - Abnormal; Notable for the following components:   B Natriuretic Peptide 1,681.8 (*)    All other components within normal limits    EKG EKG Interpretation  Date/Time:  Friday May 22 2018 17:21:21 EST Ventricular Rate:  77 PR Interval:    QRS Duration: 144 QT Interval:  473 QTC Calculation: 536 R Axis:   -143 Text Interpretation:  Sinus rhythm Atrial premature complexes Right bundle branch block Confirmed by Blane Ohara [phone]) on 05/22/2018 5:42:55 PM   Radiology Dg Chest 2 View  Result Date: 05/22/2018 CLINICAL DATA:  Leg edema EXAM: CHEST - 2 VIEW COMPARISON:  05/07/2018, 03/14/2018, CT 02/03/2018 FINDINGS: Small moderate bilateral pleural effusions, slightly increased on the right side. Stable cardiomegaly with vascular congestion and interstitial pulmonary edema. Worsened basilar airspace disease. Post sternotomy changes. Moderate compression fracture with mild sclerosis of a lower thoracic vertebra. IMPRESSION: 1. Small moderate bilateral pleural effusions, increased on the right  side. Cardiomegaly with vascular congestion and mild pulmonary edema 2. Bilateral lung base atelectasis or pneumonia. 3. Moderate lower thoracic compression fracture, approximate T11, new since CT 02/04/2018 Electronically Signed   By: Jasmine Pang M.D.   On: 05/22/2018 18:40     Procedures Procedures (including critical care time)  Medications Ordered in ED Medications  furosemide (LASIX) injection 60 mg (has no administration in time range)     Initial Impression / Assessment and Plan / ED Course  I have reviewed the triage vital signs and the nursing notes.  Pertinent labs & imaging results that were available during my care of the patient were reviewed by me and considered in my medical decision making (see chart for details).       Patient presents with clinical concern for congestive heart failure exacerbation with weight gain, leg edema, rales on chest x-ray and exam.  Patient stable on 2 to 3 L nasal cannula.  Blood work reviewed potassium normal, chronic anemia, acute renal failure.  IV Lasix 60 mg ordered.  Discussed with hospitalist for admission. Reviewed medical records, last echo.  The patients results and plan were reviewed and discussed.   Any x-rays performed were independently reviewed by myself.   Differential diagnosis were considered with the presenting HPI.  Medications  furosemide (LASIX) injection 60 mg (has no administration in time range)    Vitals:   05/22/18 1800 05/22/18 1815 05/22/18 1830 05/22/18 1900  BP: 123/71 121/74 114/66 133/89  Pulse: 69     Resp: (!) 25  Temp:      TempSrc:      SpO2: 100%     Weight:      Height:        Final diagnoses:  Acute on chronic systolic congestive heart failure (HCC)  Hypoxia  Bilateral leg edema    Admission/ observation were discussed with the admitting physician, patient and/or family and they are comfortable with the plan.   Final Clinical Impressions(s) / ED Diagnoses   Final diagnoses:  Acute on chronic systolic congestive heart failure (HCC)  Hypoxia  Bilateral leg edema    ED Discharge Orders    None       Blane Ohara, MD 05/22/18 1945    Blane Ohara, MD 05/22/18 1946

## 2018-05-22 NOTE — Patient Instructions (Addendum)
Medication Instructions:  Continue all current medications.  Follow-Up: Pending   Any Other Special Instructions Will Be Listed Below (If Applicable). Going to [facility]   If you need a refill on your cardiac medications before your next appointment, please call your pharmacy.

## 2018-05-22 NOTE — ED Notes (Signed)
Patient transported to X-ray 

## 2018-05-22 NOTE — Progress Notes (Signed)
SUBJECTIVE: The patient returns for routine follow-up. She has a history of coronary artery disease and CABG (May 2012) with chronic biventricular heart failure, as well as peripheral vascular disease and is status post right carotid endarterectomy, along with hypertension and stage III chronic kidney disease.  Echocardiogram on 10/10/2017 performed at [facility] demonstrated severely reduced left ventricular systolic function, LVEF 20 to 50%.  There was restrictive grade 3 diastolic dysfunction.  There were multiple wall motion abnormalities.  The right ventricle was mildly dilated with severely reduced systolic function.  There was moderate tricuspid rotation with severe pulmonary hypertension, calculated PASP 90 mmHg.  We previously discussed AICD and she refused.  She told me that she fell and injured her back about 5 or 6 weeks ago.  She says from that time she started retaining fluid.  She is here with a representative from the rehabilitation facility in [place].  She says that she was sent there on 05/15/2018 after being hospitalized for CHF at [facility].  She weighed 101 pounds on 11/25/2017 and weighs 136 pounds today.  Her legs are markedly swollen she is using oxygen by nasal cannula.  She denies chest pain.   Review of Systems: As per "subjective", otherwise negative.  Allergies  Allergen Reactions  . Lisinopril Cough  . Eliquis [Apixaban]   . Sulfonamide Derivatives Rash and Other (See Comments)    Severe Headache    Current Outpatient Medications  Medication Sig Dispense Refill  . atorvastatin (LIPITOR) 40 MG tablet Take 40 mg by mouth daily.     . carvedilol (COREG) 12.5 MG tablet Take 12.5 mg by mouth 2 (two) times daily with a meal.     . furosemide (LASIX) 80 MG tablet Take 80 mg by mouth 2 (two) times daily.    . mirtazapine (REMERON) 15 MG tablet Take 15 mg by mouth at bedtime.    . nitroGLYCERIN (NITROSTAT) 0.4 MG SL tablet Place 0.4 mg under the  tongue every 5 (five) minutes as needed. For chest pain    . pantoprazole (PROTONIX) 40 MG tablet Take 40 mg by mouth 2 (two) times daily.     . potassium chloride (K-DUR,KLOR-CON) 10 MEQ tablet Take 10 mEq by mouth as needed.     No current facility-administered medications for this visit.     Past Medical History:  Diagnosis Date  . Arthritis    back  . Carotid artery disease (HCC)    followed by vascular surgery  Carotid duplex in 02/2010-60-79% right proximal internal carotid artery stenosis; plaque without focal stenosis on the left; no change compared with 08/30/2009.  . Cataract    bilateral   . CHF (congestive heart failure) (HCC)   . Claustrophobia   . Coronary artery disease    Acute anteroseptal MI treated with urgent PCI and followed by CABG surgery for left main and severe three-vessel coronary disease in 07/2010; prior percutaneous intervention in 05/2007 with DES to the proximal LAD; RCA stent in 09/2009; both stents occluded as well as M1 prior to surgery  . Coronary atherosclerosis of native coronary artery    echocardiogram June 2012 ejection fraction 40% with apical and anteroseptal akinesis with scarring.  . Creatinine elevation   . Edema   . Fall at home Oct. 28, 2016   Fx Right Hip  . GERD (gastroesophageal reflux disease)   . Hemothorax on left   . HOH (hard of hearing)   . Myocardial infarction of anterior wall greater  than eight weeks ago    large anterior wall myocardial infarction status post emergent PCI followed by coronary bypass grafting.,  Ejection fraction 40%  . Osteoporosis 01/17/2015  . Pneumonia   . Shortness of breath    With ambulation  . Transient atrial fibrillation or flutter    post myocardial infarction and she remains in normal sinus rhythm  . Unspecified essential hypertension   . Vitamin D deficiency 01/17/2015    Past Surgical History:  Procedure Laterality Date  . APPENDECTOMY    . CABGx 3  08/09/10   [name]  . CARDIAC  CATHETERIZATION  2001, 2008, 2009, 2012  . CAROTID ENDARTERECTOMY Right 12-13-11   CEA  . COLONOSCOPY  2012  . CORONARY ARTERY BYPASS GRAFT  08/08/2010   Triple bypass  . ENDARTERECTOMY  12/13/2011   Procedure: ENDARTERECTOMY CAROTID;  Surgeon: Larina Earthly, MD;  Location: [facility] OR;  Service: Vascular;  Laterality: Right;  Right Carotid endartectomy with dacron patch angioplasty  . EYE SURGERY Bilateral 2014   cataract lens implant  . FRACTURE SURGERY Right 01-13-15   Hip Fx - pt fell in home  . HAND SURGERY     right hand  . PR VEIN BYPASS GRAFT,AORTO-FEM-POP  07/29/10  . Sternal wound exploration, evacuation of left hemothorax,  and sternal wou nd closure  08/13/10   Hendrickson  . TONSILLECTOMY      Social History   Socioeconomic History  . Marital status: Widowed    Spouse name: Not on file  . Number of children: Not on file  . Years of education: Not on file  . Highest education level: Not on file  Occupational History  . Not on file  Social Needs  . Financial resource strain: Not on file  . Food insecurity:    Worry: Not on file    Inability: Not on file  . Transportation needs:    Medical: Not on file    Non-medical: Not on file  Tobacco Use  . Smoking status: Never Smoker  . Smokeless tobacco: Never Used  Substance and Sexual Activity  . Alcohol use: No    Alcohol/week: 0.0 standard drinks  . Drug use: No  . Sexual activity: Not on file  Lifestyle  . Physical activity:    Days per week: Not on file    Minutes per session: Not on file  . Stress: Not on file  Relationships  . Social connections:    Talks on phone: Not on file    Gets together: Not on file    Attends religious service: Not on file    Active member of club or organization: Not on file    Attends meetings of clubs or organizations: Not on file    Relationship status: Not on file  . Intimate partner violence:    Fear of current or ex partner: Not on file    Emotionally abused: Not on file     Physically abused: Not on file    Forced sexual activity: Not on file  Other Topics Concern  . Not on file  Social History Narrative   Regular exercise- yes      Vitals:   05/22/18 1434  BP: 132/72  Pulse: 73  Weight: 136 lb (61.7 kg)  Height: 5\' 1"  (1.549 m)    Wt Readings from Last 3 Encounters:  05/22/18 136 lb (61.7 kg)  11/25/17 101 lb (45.8 kg)  05/27/17 112 lb (50.8 kg)     PHYSICAL EXAM General:  NAD, chronically ill-appearing, frail. HEENT: Normal. Neck: + JVD, no thyromegaly. Lungs: Diffusely diminished breath sounds with bibasilar Rales. CV: Regular rate and rhythm, normal S1/S2, +S3, soft 1/6 systolic murmur along left sternal border.  3+ pitting bilateral lower extremity edema to the mid thighs.   Abdomen: Soft, nontender, mildly distended.  Neurologic: Alert and oriented.  Psych: Normal affect. Skin: Normal. Musculoskeletal: No gross deformities.    ECG: Reviewed above under Subjective   Labs: Lab Results  Component Value Date/Time   K 4.3 01/16/2015 04:00 AM   BUN 17 01/16/2015 04:00 AM   CREATININE 1.18 (H) 01/16/2015 04:00 AM   CREATININE 1.44 (H) 10/25/2011 12:35 PM   ALT 23 01/16/2015 04:00 AM   TSH 3.558 01/14/2015 01:07 PM   HGB 9.5 (L) 01/14/2015 12:26 AM     Lipids: No results found for: LDLCALC, LDLDIRECT, CHOL, TRIG, HDL     ASSESSMENT AND PLAN: 1.  Coronary artery disease: History of CABG.  LVEF 20 to 25%.  Currently on carvedilol and Lipitor.  She previously developed a cough with lisinopril and angiotensin receptor blockers.  I would recommend she restart aspirin 81 mg daily but I need to obtain hospitalization records to see what CBC was most recently.  2.  Ischemic cardiomyopathy with acute on chronic decompensated biventricular heart failure: LVEF 20 to 25%.  She is currently on Lasix 80 mg twice daily with supplemental potassium and is roughly 35 pounds above her dry weight.  She is grossly edematous.  She is also on  carvedilol. She previously developed a cough with lisinopril and angiotensin receptor blockers.  She needs hospitalization for IV diuresis and possibly inotropes if necessary.  I have recommended she go to [facility]. I have requested hospitalization records from [facility].  3. PVD and s/p right carotid endarterectomy:Continue statin.Follows with vascular surgery.    I would recommend she restart aspirin 81 mg daily but I need to obtain hospitalization records to see what CBC was most recently.  4.  Hypertension: BP is normal.  This will need continued monitoring if she needs IV diuresis.  5.  History of pulmonary embolism: No longer on Eliquis.   Disposition: Follow up after hospitalization  A high level of decision making was required for increased medical complexities.    Prentice Docker, M.D., F.A.C.C.

## 2018-05-22 NOTE — H&P (Addendum)
[name] ENI:[id number] DOB: 21-Apr-1929 DOA: 05/22/2018     PCP: Kirstie Peri, MD   Outpatient Specialists:  CARDS:   Vertell Limber, MD    Patient arrived to ER on 05/22/18 at 1700  Patient coming from: [facility]    Chief Complaint:  Chief Complaint  Patient presents with  . Leg Swelling    HPI: [name] is a 85 y.o. female with medical history significant of coronary artery disease and CABG (May 2012) with chronic biventricular heart failure, as well as peripheral vascular disease and is status post right carotid endarterectomy, along with hypertension and stage III chronic kidney disease,  hypertension Remote history of PE no longer on Eliquis  Presented with   Fluid retention leg edema increased oxygen requirement she is now on oxygen no associated chest pain She has fallen 6 weeks ago and since then has been retaining fluid She weighed 101 pounds on 11/25/2017 and weighs 136 pounds today. He has been seen by cardiology today who felt that she needs admission for IV diuresis Last admission on 05/15/2018 after being hospitalized for CHF at [facility].   Reports no chest pain just worsening shortness of breath over past 1 week  Had to use oxygen.   1 month a go she had a bout of diarrhea lasted 3 wks now have resolved.  Reports she has been taking lasix at rehab center   Regarding pertinent Chronic problems:  RE CHF   last ECHO July 2019 LVEF 20 to 25%.  There was restrictive grade 3 diastolic dysfunction.  There were multiple wall motion abnormalities.  The right ventricle was mildly dilated with severely reduced systolic function.  There was moderate tricuspid rotation with severe pulmonary hypertension, calculated PASP 90 mmHg.   she refused AICD  She does not tolerate lisinopril Is currently on Lasix Patient did had an episode of atrial flutter fibrillation and flutter post myocardial infarction but since then remained in normal  sinus History of hypertension and CAD states on Coreg Lipitor  While in ER:  The following Work up has been ordered so far:  Orders Placed This Encounter  Procedures  . DG Chest 2 View  . CBC with Differential  . Comprehensive metabolic panel  . Brain natriuretic peptide  . Consult for [facility] Admission  . EKG 12-Lead   Following Medications were ordered in ER: Medications  furosemide (LASIX) injection 60 mg (has no administration in time range)    Significant initial  Findings: Abnormal Labs Reviewed  CBC WITH DIFFERENTIAL/PLATELET - Abnormal; Notable for the following components:      Result Value   RBC 3.70 (*)    Hemoglobin 10.3 (*)    HCT 34.1 (*)    Platelets 141 (*)    All other components within normal limits  COMPREHENSIVE METABOLIC PANEL - Abnormal; Notable for the following components:   Chloride 94 (*)    CO2 33 (*)    Glucose, Bld 115 (*)    Creatinine, Ser 1.45 (*)    Calcium 8.3 (*)    Albumin 2.7 (*)    GFR calc non Af Amer 33 (*)    GFR calc Af Amer 38 (*)    All other components within normal limits  BRAIN NATRIURETIC PEPTIDE - Abnormal; Notable for the following components:   B Natriuretic Peptide 1,681.8 (*)    All other components within normal limits     Lactic Acid, Venous No results found for:  LATICACIDVEN  Na 140 K 4.0  Cr    Up from baseline see below Lab Results  Component Value Date   CREATININE 1.45 (H) 05/22/2018   CREATININE 1.18 (H) 01/16/2015   CREATININE 1.34 (H) 01/15/2015    HG/HCT Up from baseline see below    Component Value Date/Time   HGB 10.3 (L) 05/22/2018 1750   HCT 34.1 (L) 05/22/2018 1750    Troponin (Point of Care Test) No results for input(s): TROPIPOC in the last 72 hours.    BNP (last 3 results) Recent Labs    05/22/18 1750  BNP 1,681.8*    ProBNP (last 3 results) No results for input(s): PROBNP in the last 8760 hours.     UA  not ordered     CXR -bilateral pleural effusions  cardiomegaly bilateral lung base atelectasis right lower thoracic compression fracture T11 new since prior CT   ECG:  Personally reviewed by me showing: HR : 77 Rhythm:   RBBB, w PAC    no evidence of ischemic changes QTC 536     ED Triage Vitals  Enc Vitals Group     BP 05/22/18 1722 129/85     Pulse Rate 05/22/18 1722 77     Resp 05/22/18 1722 15     Temp 05/22/18 1722 97.8 F (36.6 C)     Temp Source 05/22/18 1722 Oral     SpO2 05/22/18 1722 98 %     Weight 05/22/18 1724 135 lb 15.7 oz (61.7 kg)     Height 05/22/18 1724  (1.549 m)     Head Circumference --      Peak Flow --      Pain Score 05/22/18 1724 0     Pain Loc --      Pain Edu? --      Excl. in GC? --   TMAX(24)@       Latest  Blood pressure 133/89, pulse 69, temperature 97.8 F (36.6 C), temperature source Oral, resp. rate (!) 25, height  (1.549 m), weight 61.7 kg, SpO2 100 %.    Hospitalist was called for admission for CHF exacerbation    Review of Systems:    Pertinent positives include: shortness of breath at rest.  dyspnea on exertion, Bilateral lower extremity swelling   Constitutional:  No weight loss, night sweats, Fevers, chills, fatigue, weight loss  HEENT:  No headaches, Difficulty swallowing,Tooth/dental problems,Sore throat,  No sneezing, itching, ear ache, nasal congestion, post nasal drip,  Cardio-vascular:  No chest pain, Orthopnea, PND, anasarca, dizziness, palpitations.no  GI:  No heartburn, indigestion, abdominal pain, nausea, vomiting, diarrhea, change in bowel habits, loss of appetite, melena, blood in stool, hematemesis Resp:  no  No excess mucus, no productive cough, No non-productive cough, No coughing up of blood.No change in color of mucus.No wheezing. Skin:  no rash or lesions. No jaundice GU:  no dysuria, change in color of urine, no urgency or frequency. No straining to urinate.  No flank pain.  Musculoskeletal:  No joint pain or no joint swelling. No decreased  range of motion. No back pain.  Psych:  No change in mood or affect. No depression or anxiety. No memory loss.  Neuro: no localizing neurological complaints, no tingling, no weakness, no double vision, no gait abnormality, no slurred speech, no confusion  All systems reviewed and apart from HOPI all are negative  Past Medical History:   Past Medical History:  Diagnosis Date  . Arthritis  back  . Carotid artery disease (HCC)    followed by vascular surgery  Carotid duplex in 02/2010-60-79% right proximal internal carotid artery stenosis; plaque without focal stenosis on the left; no change compared with 08/30/2009.  . Cataract    bilateral   . CHF (congestive heart failure) (HCC)   . Claustrophobia   . Coronary artery disease    Acute anteroseptal MI treated with urgent PCI and followed by CABG surgery for left main and severe three-vessel coronary disease in 07/2010; prior percutaneous intervention in 05/2007 with DES to the proximal LAD; RCA stent in 09/2009; both stents occluded as well as M1 prior to surgery  . Coronary atherosclerosis of native coronary artery    echocardiogram June 2012 ejection fraction 40% with apical and anteroseptal akinesis with scarring.  . Creatinine elevation   . Edema   . Fall at home Oct. 28, 2016   Fx Right Hip  . GERD (gastroesophageal reflux disease)   . Hemothorax on left   . HOH (hard of hearing)   . Myocardial infarction of anterior wall greater than eight weeks ago    large anterior wall myocardial infarction status post emergent PCI followed by coronary bypass grafting.,  Ejection fraction 40%  . Osteoporosis 01/17/2015  . Pneumonia   . Shortness of breath    With ambulation  . Transient atrial fibrillation or flutter    post myocardial infarction and she remains in normal sinus rhythm  . Unspecified essential hypertension   . Vitamin D deficiency 01/17/2015      Past Surgical History:  Procedure Laterality Date  . APPENDECTOMY    .  CABGx 3  08/09/10   [name]  . CARDIAC CATHETERIZATION  2001, 2008, 2009, 2012  . CAROTID ENDARTERECTOMY Right 12-13-11   CEA  . COLONOSCOPY  2012  . CORONARY ARTERY BYPASS GRAFT  08/08/2010   Triple bypass  . ENDARTERECTOMY  12/13/2011   Procedure: ENDARTERECTOMY CAROTID;  Surgeon: Larina Earthly, MD;  Location: [facility] OR;  Service: Vascular;  Laterality: Right;  Right Carotid endartectomy with dacron patch angioplasty  . EYE SURGERY Bilateral 2014   cataract lens implant  . FRACTURE SURGERY Right 01-13-15   Hip Fx - pt fell in home  . HAND SURGERY     right hand  . PR VEIN BYPASS GRAFT,AORTO-FEM-POP  07/29/10  . Sternal wound exploration, evacuation of left hemothorax,  and sternal wou nd closure  08/13/10   [facility]on  . TONSILLECTOMY      Social History:  Ambulatory   independently       reports that she has never smoked. She has never used smokeless tobacco. She reports that she does not drink alcohol or use drugs.     Family History:   Family History  Problem Relation Age of Onset  . Heart failure Father        chf  . Heart disease Father   . Hyperlipidemia Mother   . Hypertension Mother   . Aneurysm Mother        AAA  . Heart disease Mother   . Hyperlipidemia Son   . Hypertension Son   . Heart disease Son   . Peripheral vascular disease Son        AMPUTATION  . Diabetes Son        Amputation Left foot  . Cancer Other   . Coronary artery disease Other   . Hyperlipidemia Sister   . Hypertension Sister   . Heart disease Sister  Heart Disease before age 32  . Heart attack Sister     Allergies: Allergies  Allergen Reactions  . Lisinopril Cough  . Ace Inhibitors Other (See Comments)    "Allergic," per MAR  . Eliquis [Apixaban] Other (See Comments)    "Allergic," per MAR  . Lipitor [Atorvastatin] Other (See Comments)    "Allergic," per MAR  . Sulfonamide Derivatives Rash and Other (See Comments)    Severe Headache, also AND "Allergic," per [facility]      Prior to Admission medications   Medication Sig Start Date End Date Taking? Authorizing Provider  atorvastatin (LIPITOR) 40 MG tablet Take 40 mg by mouth daily.    Yes [provider]  carvedilol (COREG) 12.5 MG tablet Take 12.5 mg by mouth 2 (two) times daily with a meal.  09/28/10  Yes Rothbart, Gerrit Friends, MD  cefUROXime (CEFTIN) 250 MG tablet Take 250 mg by mouth See admin instructions. Take 250 mg by mouth once a day for 7 days, for UTI 05/15/18 05/22/18 Yes [provider]  furosemide (LASIX) 80 MG tablet Take 80 mg by mouth 2 (two) times daily.   Yes [provider]  mirtazapine (REMERON) 15 MG tablet Take 15 mg by mouth at bedtime.   Yes [provider]  pantoprazole (PROTONIX) 40 MG tablet Take 40 mg by mouth 2 (two) times daily.    Yes [provider]  potassium chloride (K-DUR,KLOR-CON) 10 MEQ tablet Take 10 mEq by mouth daily.    Yes [provider]  nitroGLYCERIN (NITROSTAT) 0.4 MG SL tablet Place 0.4 mg under the tongue every 5 (five) minutes as needed for chest pain.     [provider]   Physical Exam: Blood pressure 133/89, pulse 69, temperature 97.8 F (36.6 C), temperature source Oral, resp. rate (!) 25, height 5\' 1"  (1.549 m), weight 61.7 kg, SpO2 100 %. 1. General:  in No  Acute distress    Chronically ill  -appearing 2. Psychological: Alert and   Oriented 3. Head/ENT:   Moist  Mucous Membranes                          Head Non traumatic, neck supple                      Poor Dentition 4. SKIN: normal   Skin turgor,  Skin clean Dry and intact no rash 5. Heart: Regular rate and rhythm systolic Murmur, no Rub or gallop 6. Lungs:  no wheezes some crackles   7. Abdomen: Soft,  non-tender,  distended bowel sounds present 8. Lower extremities: no clubbing, cyanosis, 3+ edema 9. Neurologically Grossly intact, moving all 4 extremities equally  10. MSK: Normal range of motion   LABS:     Recent Labs  Lab  05/22/18 1750  WBC 4.8  NEUTROABS 3.3  HGB 10.3*  HCT 34.1*  MCV 92.2  PLT 141*   Basic Metabolic Panel: Recent Labs  Lab 05/22/18 1750  NA 140  K 4.0  CL 94*  CO2 33*  GLUCOSE 115*  BUN 18  CREATININE 1.45*  CALCIUM 8.3*      Recent Labs  Lab 05/22/18 1750  AST 37  ALT 26  ALKPHOS 81  BILITOT 0.8  PROT 6.8  ALBUMIN 2.7*   No results for input(s): LIPASE, AMYLASE in the last 168 hours. No results for input(s): AMMONIA in the last 168 hours.    HbA1C: No results  for input(s): HGBA1C in the last 72 hours. CBG: No results for input(s): GLUCAP in the last 168 hours.    Urine analysis:    Component Value Date/Time   COLORURINE YELLOW 12/12/2011 1409   APPEARANCEUR CLEAR 12/12/2011 1409   LABSPEC 1.007 12/12/2011 1409   PHURINE 7.0 12/12/2011 1409   GLUCOSEU NEGATIVE 12/12/2011 1409   HGBUR NEGATIVE 12/12/2011 1409   BILIRUBINUR NEGATIVE 12/12/2011 1409   KETONESUR NEGATIVE 12/12/2011 1409   PROTEINUR NEGATIVE 12/12/2011 1409   UROBILINOGEN 0.2 12/12/2011 1409   NITRITE NEGATIVE 12/12/2011 1409   LEUKOCYTESUR TRACE (A) 12/12/2011 1409       Cultures:    Component Value Date/Time   SDES TRACHEAL ASPIRATE 08/19/2010 1815   SDES URINE, CATHETERIZED 08/19/2010 1815   SPECREQUEST NONE 08/19/2010 1815   SPECREQUEST NONE 08/19/2010 1815   CULT Non-Pathogenic Oropharyngeal-type Flora Isolated. 08/19/2010 1815   CULT NO GROWTH 08/19/2010 1815   REPTSTATUS 08/22/2010 FINAL 08/19/2010 1815   REPTSTATUS 08/21/2010 FINAL 08/19/2010 1815     Radiological Exams on Admission: Dg Chest 2 View  Result Date: 05/22/2018 CLINICAL DATA:  Leg edema EXAM: CHEST - 2 VIEW COMPARISON:  05/07/2018, 03/14/2018, CT 02/03/2018 FINDINGS: Small moderate bilateral pleural effusions, slightly increased on the right side. Stable cardiomegaly with vascular congestion and interstitial pulmonary edema. Worsened basilar airspace disease. Post sternotomy changes. Moderate compression  fracture with mild sclerosis of a lower thoracic vertebra. IMPRESSION: 1. Small moderate bilateral pleural effusions, increased on the right side. Cardiomegaly with vascular congestion and mild pulmonary edema 2. Bilateral lung base atelectasis or pneumonia. 3. Moderate lower thoracic compression fracture, approximate T11, new since CT 02/04/2018 Electronically Signed   By: Jasmine Pang M.D.   On: 05/22/2018 18:40    Chart has been reviewed    Assessment/Plan  85 y.o. female with medical history significant of coronary artery disease and CABG (May 2012) with chronic biventricular heart failure, as well as peripheral vascular disease and is status post right carotid endarterectomy, along with hypertension and stage III chronic kidney disease,  hypertension Remote history of PE no longer on Eliquis  Admitted for CHF exacerbation  Present on Admission:  . Acute on chronic combined systolic and diastolic CHF (congestive heart failure) (HCC) -  - admit on telemetry,  cycle cardiac enzymes, Troponin 0.05   obtain serial ECG  to evaluate for ischemia as a cause of heart failure  monitor daily weight:  Filed Weights   05/22/18 1724  Weight: 61.7 kg   Last BNP BNP (last 3 results) Recent Labs    05/22/18 1750  BNP 1,681.8*    ProBNP (last 3 results) No results for input(s): PROBNP in the last 8760 hours.   diurese with IV lasix and monitor orthostatics and creatinine to avoid over diuresis.  Order echogram to evaluate EF and valves  ACE/ARBi  Contraindicated pt does not tolerate    cardiology consult in AM  will need help in management given soft BP with diueresis  . Dyslipidemia - Stable cont home medications . HTN (hypertension) - Soft BP in ER after administration of Lasix will hold off for now . Paroxysmal atrial fibrillation (HCC) -           - CHA2DS2 vas score  Not on anticoagulation secondary to Risk of  Falls and advance age was an isolated even in the setting of MI             . Coronary artery disease - cycle CE, slightly elevated troponin  in the setting of demand ischemia . CKD (chronic kidney disease), stage III (HCC) - chronic stable cont to monitor avoid nephrotoxic medications  Compression FRX - currently not endorsing any discomfort - symptomatic management  Other plan as per orders.  DVT prophylaxis:   Lovenox     Code Status:  DNR/DNI as per patient   I had personally discussed CODE STATUS with patient   Family Communication:   Family not  at  Bedside    Disposition Plan:     To home once workup is complete and patient is stable                  Would benefit from PT/OT eval prior to DC  Ordered                  Consults  :  PLEASE CONSULT CARDIOLOGY in AM   Admission status:    inpatient     Expect 2 midnight stay secondary to severity of patient's current illness including   hemodynamic instability despite optimal treatment ( hypotension  Hypoxia, )  Severe lab/radiological/exam abnormalities including:  CHF   and extensive comorbidities including:   CHF .  CAD CKD   That are currently affecting medical management.   I expect  patient to be hospitalized for 2 midnights requiring inpatient medical care.   Patient is at high risk for adverse outcome (such as loss of life or disability) if not treated.  Indication for inpatient stay as follows:    Hemodynamic instability despite maximal medical therapy,    New or worsening hypoxia  Need for IV  medications     Level of care     tele  For 24H        [name] 05/22/2018, 8:29 PM    [facility]     after 2 AM please page floor coverage PA If 7AM-7PM, please contact the day team taking care of the patient using [email or web address]

## 2018-05-22 NOTE — ED Triage Notes (Addendum)
Pt brought in by ems from [facility]   facility per PCP ; pcp wanted her to come here to get evaluated for acute on chronic CHF exacerbation ; pt denies any chest pain or sob ; pt just states that she has been having bilateral feet swelling x 1  month

## 2018-05-22 NOTE — ED Notes (Signed)
Admitting Provider at bedside. 

## 2018-05-22 NOTE — ED Notes (Signed)
Patient belongings inventoried and placed in locker #2 

## 2018-05-22 NOTE — ED Notes (Signed)
ED TO INPATIENT HANDOFF REPORT  ED Nurse Name and Phone #:  Erie Noe   [phone]  S Name/Age/Gender [name] 85 y.o. female Room/Bed: 020C/020C  Code Status   Code Status: Prior  Home/SNF/Other Skilled nursing facility Patient oriented to: self, place, time and situation Is this baseline? Yes   Triage Complete: Triage complete  Chief Complaint sent by dr  Triage Note Pt brought in by ems from [facility]   facility per PCP ; pcp wanted her to come here to get evaluated for acute on chronic CHF exacerbation ; pt denies any chest pain or sob ; pt just states that she has been having bilateral feet swelling x 1  month    Allergies Allergies  Allergen Reactions  . Lisinopril Cough  . Ace Inhibitors Other (See Comments)    "Allergic," per MAR  . Eliquis [Apixaban] Other (See Comments)    "Allergic," per MAR  . Lipitor [Atorvastatin] Other (See Comments)    "Allergic," per MAR  . Sulfonamide Derivatives Rash and Other (See Comments)    Severe Headache, also AND "Allergic," per MAR    Level of Care/Admitting Diagnosis ED Disposition    ED Disposition Condition Comment   Admit  Hospital Area: [facility] [[id number]]  Level of Care: Cardiac Telemetry [103]  Diagnosis: CHF exacerbation [name]) [[id number]]  Admitting Physician: Therisa Doyne [[id number]]  Attending Physician: Therisa Doyne [[id number]]  Estimated length of stay: 3 - 4 days  Certification:: I certify this patient will need inpatient services for at least 2 midnights  PT Class (Do Not Modify): Inpatient [101]  PT Acc Code (Do Not Modify): Private [1]       B Medical/Surgery History Past Medical History:  Diagnosis Date  . Arthritis    back  . Carotid artery disease (HCC)    followed by vascular surgery  Carotid duplex in 02/2010-60-79% right proximal internal carotid artery stenosis; plaque without focal stenosis on the left; no change compared with 08/30/2009.  . Cataract     bilateral   . CHF (congestive heart failure) (HCC)   . Claustrophobia   . Coronary artery disease    Acute anteroseptal MI treated with urgent PCI and followed by CABG surgery for left main and severe three-vessel coronary disease in 07/2010; prior percutaneous intervention in 05/2007 with DES to the proximal LAD; RCA stent in 09/2009; both stents occluded as well as M1 prior to surgery  . Coronary atherosclerosis of native coronary artery    echocardiogram June 2012 ejection fraction 40% with apical and anteroseptal akinesis with scarring.  . Creatinine elevation   . Edema   . Fall at home Oct. 28, 2016   Fx Right Hip  . GERD (gastroesophageal reflux disease)   . Hemothorax on left   . HOH (hard of hearing)   . Myocardial infarction of anterior wall greater than eight weeks ago    large anterior wall myocardial infarction status post emergent PCI followed by coronary bypass grafting.,  Ejection fraction 40%  . Osteoporosis 01/17/2015  . Pneumonia   . Shortness of breath    With ambulation  . Transient atrial fibrillation or flutter    post myocardial infarction and she remains in normal sinus rhythm  . Unspecified essential hypertension   . Vitamin D deficiency 01/17/2015   Past Surgical History:  Procedure Laterality Date  . APPENDECTOMY    . CABGx 3  08/09/10   [name]  . CARDIAC CATHETERIZATION  2001, 2008, 2009, 2012  .  CAROTID ENDARTERECTOMY Right 12-13-11   CEA  . COLONOSCOPY  2012  . CORONARY ARTERY BYPASS GRAFT  08/08/2010   Triple bypass  . ENDARTERECTOMY  12/13/2011   Procedure: ENDARTERECTOMY CAROTID;  Surgeon: Larina Earthly, MD;  Location: [facility] OR;  Service: Vascular;  Laterality: Right;  Right Carotid endartectomy with dacron patch angioplasty  . EYE SURGERY Bilateral 2014   cataract lens implant  . FRACTURE SURGERY Right 01-13-15   Hip Fx - pt fell in home  . HAND SURGERY     right hand  . PR VEIN BYPASS GRAFT,AORTO-FEM-POP  07/29/10  . Sternal wound exploration,  evacuation of left hemothorax,  and sternal wou nd closure  08/13/10   [place]  . TONSILLECTOMY       A IV Location/Drains/Wounds Patient Lines/Drains/Airways Status   Active Line/Drains/Airways    Name:   Placement date:   Placement time:   Site:   Days:   Peripheral IV 05/22/18 Right Antecubital   05/22/18    1758    Antecubital   less than 1   Incision 12/13/11 Neck Right   12/13/11    0839     2352          Intake/Output Last 24 hours No intake or output data in the 24 hours ending 05/22/18 2008  Labs/Imaging Results for orders placed or performed during the hospital encounter of 05/22/18 (from the past 48 hour(s))  CBC with Differential     Status: Abnormal   Collection Time: 05/22/18  5:50 PM  Result Value Ref Range   WBC 4.8 4.0 - 10.5 K/uL   RBC 3.70 (L) 3.87 - 5.11 MIL/uL   Hemoglobin 10.3 (L) 12.0 - 15.0 g/dL   HCT 02.7 (L) 74.1 - 28.7 %   MCV 92.2 80.0 - 100.0 fL   MCH 27.8 26.0 - 34.0 pg   MCHC 30.2 30.0 - 36.0 g/dL   RDW 86.7 67.2 - 09.4 %   Platelets 141 (L) 150 - 400 K/uL   nRBC 0.0 0.0 - 0.2 %   Neutrophils Relative % 68 %   Neutro Abs 3.3 1.7 - 7.7 K/uL   Lymphocytes Relative 21 %   Lymphs Abs 1.0 0.7 - 4.0 K/uL   Monocytes Relative 8 %   Monocytes Absolute 0.4 0.1 - 1.0 K/uL   Eosinophils Relative 3 %   Eosinophils Absolute 0.1 0.0 - 0.5 K/uL   Basophils Relative 0 %   Basophils Absolute 0.0 0.0 - 0.1 K/uL   Immature Granulocytes 0 %   Abs Immature Granulocytes 0.01 0.00 - 0.07 K/uL    Comment: Performed at [facility], [address], [place], [place] [address]  Comprehensive metabolic panel     Status: Abnormal   Collection Time: 05/22/18  5:50 PM  Result Value Ref Range   Sodium 140 135 - 145 mmol/L   Potassium 4.0 3.5 - 5.1 mmol/L   Chloride 94 (L) 98 - 111 mmol/L   CO2 33 (H) 22 - 32 mmol/L   Glucose, Bld 115 (H) 70 - 99 mg/dL   BUN 18 8 - 23 mg/dL   Creatinine, Ser 8.36 (H) 0.44 - 1.00 mg/dL   Calcium 8.3 (L) 8.9 - 10.3 mg/dL    Total Protein 6.8 6.5 - 8.1 g/dL   Albumin 2.7 (L) 3.5 - 5.0 g/dL   AST 37 15 - 41 U/L   ALT 26 0 - 44 U/L   Alkaline Phosphatase 81 38 - 126 U/L  Total Bilirubin 0.8 0.3 - 1.2 mg/dL   GFR calc non Af Amer 33 (L) >60 mL/min   GFR calc Af Amer 38 (L) >60 mL/min   Anion gap 13 5 - 15    Comment: Performed at [facility], [address] [place], [place] [address]  Brain natriuretic peptide     Status: Abnormal   Collection Time: 05/22/18  5:50 PM  Result Value Ref Range   B Natriuretic Peptide 1,681.8 (H) 0.0 - 100.0 pg/mL    Comment: Performed at [facility], [address], [place], [place] [address]   Dg Chest 2 View  Result Date: 05/22/2018 CLINICAL DATA:  Leg edema EXAM: CHEST - 2 VIEW COMPARISON:  05/07/2018, 03/14/2018, CT 02/03/2018 FINDINGS: Small moderate bilateral pleural effusions, slightly increased on the right side. Stable cardiomegaly with vascular congestion and interstitial pulmonary edema. Worsened basilar airspace disease. Post sternotomy changes. Moderate compression fracture with mild sclerosis of a lower thoracic vertebra. IMPRESSION: 1. Small moderate bilateral pleural effusions, increased on the right side. Cardiomegaly with vascular congestion and mild pulmonary edema 2. Bilateral lung base atelectasis or pneumonia. 3. Moderate lower thoracic compression fracture, approximate T11, new since CT 02/04/2018 Electronically Signed   By: Jasmine Pang M.D.   On: 05/22/2018 18:40    Pending Labs Unresulted Labs (From admission, onward)    Start     Ordered   05/22/18 1958  Troponin I - Now Then Q6H  Now then every 6 hours,   R     05/22/18 1957          Vitals/Pain Today's Vitals   05/22/18 1915 05/22/18 1930 05/22/18 1945 05/22/18 2000  BP: 126/72 127/77 128/76 128/73  Pulse:  60    Resp: (!) 22  Temp:      TempSrc:      SpO2:  94%    Weight:      Height:      PainSc:        Isolation Precautions No active  isolations  Medications Medications  furosemide (LASIX) injection 60 mg (60 mg Intravenous Given 05/22/18 2000)    Mobility non-ambulatory Low fall risk   Focused Assessments Cardiac Assessment Handoff:  Cardiac Rhythm: Normal sinus rhythm Lab Results  Component Value Date   CKTOTAL 774 (H) 08/09/2010   CKMB (HH) 08/09/2010    32.5 CRITICAL RESULT CALLED TO, READ BACK BY AND VERIFIED WITH: L DEATON,RN 1815 08/09/10 WBOND   TROPONINI (HH) 08/09/2010    8.22        Due to the release kinetics of cTnI, a negative result within the first hours of the onset of symptoms does not rule out myocardial infarction with certainty. If myocardial infarction is still suspected, repeat the test at appropriate intervals. **Please note change in reference range.** CRITICAL RESULT CALLED TO, READ BACK BY AND VERIFIED WITH: L DEATON,RN 1518 08/09/10 WBOND   Lab Results  Component Value Date   DDIMER (H) 08/10/2010    1.10        AT THE INHOUSE ESTABLISHED CUTOFF VALUE OF 0.48 ug/mL FEU, THIS ASSAY HAS BEEN DOCUMENTED IN THE LITERATURE TO HAVE A SENSITIVITY AND NEGATIVE PREDICTIVE VALUE OF AT LEAST 98 TO 99%.  THE TEST RESULT SHOULD BE CORRELATED WITH AN ASSESSMENT OF THE CLINICAL PROBABILITY OF DVT / VTE.   Does the Patient currently have chest pain? No  , Pulmonary Assessment Handoff:  Lung sounds: Bilateral Breath Sounds: Diminished L Breath Sounds:  Diminished R Breath Sounds: Diminished O2 Device: Nasal Cannula O2 Flow Rate (L/min): 2 L/min      R Recommendations: See Admitting Provider Note  Report given to:   Additional Notes: Purewick in place after administering lasix

## 2018-05-23 ENCOUNTER — Encounter (HOSPITAL_COMMUNITY): Payer: Self-pay

## 2018-05-23 ENCOUNTER — Inpatient Hospital Stay (HOSPITAL_COMMUNITY): Payer: Medicare Other

## 2018-05-23 DIAGNOSIS — R600 Localized edema: Secondary | ICD-10-CM

## 2018-05-23 DIAGNOSIS — I340 Nonrheumatic mitral (valve) insufficiency: Secondary | ICD-10-CM

## 2018-05-23 DIAGNOSIS — S22080A Wedge compression fracture of T11-T12 vertebra, initial encounter for closed fracture: Secondary | ICD-10-CM | POA: Diagnosis present

## 2018-05-23 DIAGNOSIS — I361 Nonrheumatic tricuspid (valve) insufficiency: Secondary | ICD-10-CM

## 2018-05-23 LAB — CBC
HCT: 33.4 % — ABNORMAL LOW (ref 36.0–46.0)
Hemoglobin: 10.3 g/dL — ABNORMAL LOW (ref 12.0–15.0)
MCH: 27.9 pg (ref 26.0–34.0)
MCHC: 30.8 g/dL (ref 30.0–36.0)
MCV: 90.5 fL (ref 80.0–100.0)
Platelets: 141 10*3/uL — ABNORMAL LOW (ref 150–400)
RBC: 3.69 MIL/uL — ABNORMAL LOW (ref 3.87–5.11)
RDW: 15.6 % — ABNORMAL HIGH (ref 11.5–15.5)
WBC: 5.4 10*3/uL (ref 4.0–10.5)
nRBC: 0 % (ref 0.0–0.2)

## 2018-05-23 LAB — COMPREHENSIVE METABOLIC PANEL
ALK PHOS: 76 U/L (ref 38–126)
ALT: 22 U/L (ref 0–44)
AST: 34 U/L (ref 15–41)
Albumin: 2.7 g/dL — ABNORMAL LOW (ref 3.5–5.0)
Anion gap: 11 (ref 5–15)
BUN: 18 mg/dL (ref 8–23)
CHLORIDE: 95 mmol/L — AB (ref 98–111)
CO2: 32 mmol/L (ref 22–32)
CREATININE: 1.4 mg/dL — AB (ref 0.44–1.00)
Calcium: 8.4 mg/dL — ABNORMAL LOW (ref 8.9–10.3)
GFR calc Af Amer: 39 mL/min — ABNORMAL LOW (ref >60)
GFR calc non Af Amer: 34 mL/min — ABNORMAL LOW (ref >60)
Glucose, Bld: 134 mg/dL — ABNORMAL HIGH (ref 70–99)
Potassium: 3.9 mmol/L (ref 3.5–5.1)
Sodium: 138 mmol/L (ref 135–145)
Total Bilirubin: 0.9 mg/dL (ref 0.3–1.2)
Total Protein: 6.8 g/dL (ref 6.5–8.1)

## 2018-05-23 LAB — TROPONIN I
Troponin I: 0.05 ng/mL (ref <0.03)
Troponin I: 0.05 ng/mL (ref <0.03)

## 2018-05-23 LAB — TSH: TSH: 6.132 u[IU]/mL — ABNORMAL HIGH (ref 0.350–4.500)

## 2018-05-23 LAB — ECHOCARDIOGRAM COMPLETE
Height: 61 in
Weight: 2109.36 oz

## 2018-05-23 LAB — PHOSPHORUS: Phosphorus: 3.7 mg/dL (ref 2.5–4.6)

## 2018-05-23 LAB — MAGNESIUM: MAGNESIUM: 0.7 mg/dL — AB (ref 1.7–2.4)

## 2018-05-23 MED ORDER — MAGNESIUM SULFATE 4 GM/100ML IV SOLN
4.0000 g | Freq: Once | INTRAVENOUS | Status: AC
  Administered 2018-05-23: 4 g via INTRAVENOUS
  Filled 2018-05-23: qty 100

## 2018-05-23 MED ORDER — ASPIRIN 81 MG PO CHEW
81.0000 mg | CHEWABLE_TABLET | Freq: Every day | ORAL | Status: DC
  Administered 2018-05-23 – 2018-05-29 (×7): 81 mg via ORAL
  Filled 2018-05-23 (×7): qty 1

## 2018-05-23 MED ORDER — SODIUM CHLORIDE 0.9 % IV SOLN
INTRAVENOUS | Status: DC | PRN
  Administered 2018-05-23 – 2018-05-29 (×4): 250 mL via INTRAVENOUS

## 2018-05-23 NOTE — Progress Notes (Signed)
PROGRESS NOTE    [name]   UOR:[id number]  DOB: Sep 08, 1929  DOA: 05/22/2018 PCP: Kirstie Peri, MD   Brief Narrative:  [name] is an 85 year old female with coronary artery disease and CABG with chronic biventricular heart failure, peripheral vascular disease status post right carotid endarterectomy, hypertension, chronic kidney disease stage III presents with [facility] for swelling of her legs and hypoxia. She previously weighed 101 pounds on 11/25/2017 and now weighs 136 pounds.   Subjective: She is upset about having her blood drawn.  She feels short of breath when she moves around.  She has a mild cough.  No complaint of chest pain.    Assessment & Plan:   Principal Problem:   Acute on chronic combined systolic and diastolic CHF (congestive heart failure) -EF of 25%-follows with Dr. Purvis Sheffield  -Continue Lasix until able to wean off of oxygen - Legs are not as swollen today -She is not on an ACE inhibitor or an ARB because she had a cough - Continue carvedilol -Has declined an AICD in the past -The echo has been performed and results are pending  Active Problems:  Elevated troponin Troponin level elevated at 0.053 sets likely secondary to above-mentioned heart failure  Chronic kidney disease stage III - This is stable-follow while diuresing  Peripheral vascular disease status post right CEA Coronary artery disease status post CABG -Previously was on Eliquis but is not on it now -We will start a baby aspirin    Dyslipidemia -Continue statin    Compression fracture of T11 vertebra (HCC) -Noted on x-ray-"moderate lower thoracic compression fracture approximate T11" -No complaints of back pain but she has had falls which may have resulted in this fracture   Time spent in minutes: 35 DVT prophylaxis: Lovenox Code Status: Not resuscitate Family Communication: No family at bedside Disposition Plan: Return to rehab  facility Consultants:   None Procedures:   2D echo pending Antimicrobials:  Anti-infectives (From admission, onward)   None       Objective: Vitals:   05/22/18 2030 05/22/18 2115 05/23/18 0312 05/23/18 0841  BP: 108/86  123/89 112/77  Pulse:   72 73  Resp: (!) 25  18   Temp:   (!) 97.2 F (36.2 C)   TempSrc:      SpO2:   100%   Weight:  60.3 kg 59.8 kg   Height:  5\' 1"  (1.549 m)      Intake/Output Summary (Last 24 hours) at 05/23/2018 1254 Last data filed at 05/23/2018 1103 Gross per 24 hour  Intake 0 ml  Output 500 ml  Net -500 ml   Filed Weights   05/22/18 1724 05/22/18 2115 05/23/18 0312  Weight: 61.7 kg 60.3 kg 59.8 kg    Examination: General exam: Appears comfortable  HEENT: PERRLA, oral mucosa moist, no sclera icterus or thrush Respiratory system: Coarse crackles at bilateral bases-respiratory effort normal. Cardiovascular system: S1 & S2 heard, RRR.   Gastrointestinal system: Abdomen soft, non-tender, nondistended. Normal bowel sounds. Central nervous system: Alert and oriented. No focal neurological deficits. Extremities: No cyanosis, clubbing or edema Skin: No rashes or ulcers Psychiatry:  Mood & affect appropriate.     Data Reviewed: I have personally reviewed following labs and imaging studies  CBC: Recent Labs  Lab 05/22/18 1750 05/23/18 0859  WBC 4.8 5.4  NEUTROABS 3.3  --   HGB 10.3* 10.3*  HCT 34.1* 33.4*  MCV 92.2 90.5  PLT 141* 141*   Basic  Metabolic Panel: Recent Labs  Lab 05/22/18 1750 05/23/18 0859  NA 140 138  K 4.0 3.9  CL 94* 95*  CO2 33* 32  GLUCOSE 115* 134*  BUN 18 18  CREATININE 1.45* 1.40*  CALCIUM 8.3* 8.4*  MG  --  0.7*  PHOS  --  3.7   GFR: Estimated Creatinine Clearance: 24 mL/min (A) (by C-G formula based on SCr of 1.4 mg/dL (H)). Liver Function Tests: Recent Labs  Lab 05/22/18 1750 05/23/18 0859  AST 37 34  ALT 26 22  ALKPHOS 81 76  BILITOT 0.8 0.9  PROT 6.8 6.8  ALBUMIN 2.7* 2.7*   No  results for input(s): LIPASE, AMYLASE in the last 168 hours. No results for input(s): AMMONIA in the last 168 hours. Coagulation Profile: No results for input(s): INR, PROTIME in the last 168 hours. Cardiac Enzymes: Recent Labs  Lab 05/22/18 2124 05/23/18 0255 05/23/18 0859  TROPONINI 0.05* 0.05* 0.05*   BNP (last 3 results) No results for input(s): PROBNP in the last 8760 hours. HbA1C: No results for input(s): HGBA1C in the last 72 hours. CBG: No results for input(s): GLUCAP in the last 168 hours. Lipid Profile: No results for input(s): CHOL, HDL, LDLCALC, TRIG, CHOLHDL, LDLDIRECT in the last 72 hours. Thyroid Function Tests: Recent Labs    05/23/18 0255  TSH 6.132*   Anemia Panel: No results for input(s): VITAMINB12, FOLATE, FERRITIN, TIBC, IRON, RETICCTPCT in the last 72 hours. Urine analysis:    Component Value Date/Time   COLORURINE YELLOW 12/12/2011 1409   APPEARANCEUR CLEAR 12/12/2011 1409   LABSPEC 1.007 12/12/2011 1409   PHURINE 7.0 12/12/2011 1409   GLUCOSEU NEGATIVE 12/12/2011 1409   HGBUR NEGATIVE 12/12/2011 1409   BILIRUBINUR NEGATIVE 12/12/2011 1409   KETONESUR NEGATIVE 12/12/2011 1409   PROTEINUR NEGATIVE 12/12/2011 1409   UROBILINOGEN 0.2 12/12/2011 1409   NITRITE NEGATIVE 12/12/2011 1409   LEUKOCYTESUR TRACE (A) 12/12/2011 1409   Sepsis Labs: @LABRCNTIP (procalcitonin:4,lacticidven:4) )No results found for this or any previous visit (from the past 240 hour(s)).       Radiology Studies: Dg Chest 2 View  Result Date: 05/22/2018 CLINICAL DATA:  Leg edema EXAM: CHEST - 2 VIEW COMPARISON:  05/07/2018, 03/14/2018, CT 02/03/2018 FINDINGS: Small moderate bilateral pleural effusions, slightly increased on the right side. Stable cardiomegaly with vascular congestion and interstitial pulmonary edema. Worsened basilar airspace disease. Post sternotomy changes. Moderate compression fracture with mild sclerosis of a lower thoracic vertebra. IMPRESSION: 1. Small  moderate bilateral pleural effusions, increased on the right side. Cardiomegaly with vascular congestion and mild pulmonary edema 2. Bilateral lung base atelectasis or pneumonia. 3. Moderate lower thoracic compression fracture, approximate T11, new since CT 02/04/2018 Electronically Signed   By: Jasmine Pang M.D.   On: 05/22/2018 18:40      Scheduled Meds: . atorvastatin  40 mg Oral Q2000  . carvedilol  12.5 mg Oral BID WC  . enoxaparin (LOVENOX) injection  30 mg Subcutaneous Q24H  . furosemide  40 mg Intravenous BID  . mirtazapine  15 mg Oral QHS  . pantoprazole  40 mg Oral BID  . sodium chloride flush  3 mL Intravenous Q12H   Continuous Infusions: . sodium chloride    . magnesium sulfate 1 - 4 g bolus IVPB       LOS: 1 day      Calvert Cantor, MD [facility] Pager: [email or web address] Password [facility] 05/23/2018, 12:54 PM

## 2018-05-23 NOTE — Progress Notes (Signed)
Patient sleeping during shift report.      

## 2018-05-23 NOTE — Clinical Social Work Note (Signed)
Clinical Social Work Assessment  Patient Details  Name: [name] MRN: [id number] Date of Birth: 1929/08/09  Date of referral:  05/23/18               Reason for consult:  Discharge Planning                Permission sought to share information with:  Chartered certified accountant granted to share information::  Yes, Verbal Permission Granted  Name::        Agency::  [facility]  Relationship::     Contact Information:     Housing/Transportation Living arrangements for the past 2 months:  [place], [place] of Information:  Patient, Medical Team Patient Interpreter Needed:  None Criminal Activity/Legal Involvement Pertinent to Current Situation/Hospitalization:  No - Comment as needed Significant Relationships:  Friend, Other Family Members(Grandson) Lives with:    Do you feel safe going back to the place where you live?  Yes Need for family participation in patient care:  Yes (Comment)  Care giving concerns:  Patient is a short-term rehab resident at [facility].   Social Worker assessment / plan:  CSW met with patient. No supports at bedside. CSW introduced role and explained that discharge planning would be discussed. Patient confirmed she is a short-term rehab resident at [facility]. She would like to return for further rehab but is not sure if she has used up all of her SNF days or not. She stated she has been in and out of rehab since June. She is unable to private pay if so. CSW will check with facility admissions coordinator on Monday. PT/OT evaluations pending. No further concerns. CSW encouraged patient to contact CSW as needed. CSW will continue to follow patient for support and facilitate discharge to SNF, if able, once medically stable.  Employment status:  Retired Forensic scientist:  Medicare PT Recommendations:  Not assessed at this time [place] / Referral to community resources:   [place]  Patient/Family's Response to care:  Patient agreeable to return to SNF if she still has SNF days available. Patient's grandson and friends supportive and involved in patient's care. Patient appreciated social work intervention.  Patient/Family's Understanding of and Emotional Response to Diagnosis, Current Treatment, and Prognosis:  Patient has a good understanding of the reason for admission and social work consult. Patient appears happy with hospital care although reports she has keeps getting woken up.  Emotional Assessment Appearance:  Appears stated age Attitude/Demeanor/Rapport:  Engaged, Gracious Affect (typically observed):  Accepting, Appropriate, Calm, Pleasant Orientation:  Oriented to Self, Oriented to Place, Oriented to  Time, Oriented to Situation Alcohol / Substance use:  Never Used Psych involvement (Current and /or in the community):  No (Comment)  Discharge Needs  Concerns to be addressed:  Care Coordination Readmission within the last 30 days:  No Current discharge risk:  Dependent with Mobility Barriers to Discharge:  Continued Medical Work up   Candie Chroman, LCSW 05/23/2018, 9:46 AM

## 2018-05-23 NOTE — Evaluation (Signed)
Physical Therapy Evaluation Patient Details Name: [name] MRN: [id number] DOB: January 23, 1930 Today's Date: 05/23/2018   History of Present Illness  85 yo female with onset of PNA symptoms and pleural effusion with EF 25% was admitted from a rehab setting in [place].  Pt has been working on strengthening but had onset of CHF, 35# wgt gain, noted T11 compression fracture (prev), pleural effusion.  PMHx:  CABG, CHF, PVD, CAD, HTN, CKD3, R carotid endarterectomy  Clinical Impression  Pt was seen for mobility and strength testing, and noted her difficulty controlling standing balance, urine incontinence and her low endurance.  Pt is here from rehab per her information and will request her to return to rehab setting in [place].  Her plan for acute therapy is to progress as tolerated to increase her standing control, gait endurance and progress with independent mobility as pt is safely able.      Follow Up Recommendations CIR    Equipment Recommendations  None recommended by PT    Recommendations for Other Services Rehab consult     Precautions / Restrictions Precautions Precautions: Fall Precaution Comments: monitor vitals  Restrictions Weight Bearing Restrictions: No      Mobility  Bed Mobility Overal bed mobility: Needs Assistance Bed Mobility: Supine to Sit;Sit to Supine     Supine to sit: Mod assist Sit to supine: Mod assist   General bed mobility comments: significant help to lift trunk OOB and to assist legs and trunk back to bed  Transfers Overall transfer level: Needs assistance Equipment used: Rolling walker (2 wheeled);1 person hand held assist Transfers: Sit to/from Stand Sit to Stand: Min assist         General transfer comment: min assist to pivot and step to BSC, min to return to side of bed  Ambulation/Gait Ambulation/Gait assistance: Min assist Gait Distance (Feet): 15 Feet Assistive device: Rolling walker (2 wheeled);1 person hand held  assist Gait Pattern/deviations: Step-to pattern;Decreased stride length;Wide base of support Gait velocity: reduced Gait velocity interpretation: <1.8 ft/sec, indicate of risk for recurrent falls General Gait Details: pt requires assistance to sidestep and   Stairs            Wheelchair Mobility    Modified Rankin (Stroke Patients Only)       Balance Overall balance assessment: Needs assistance   Sitting balance-Leahy Scale: Fair       Standing balance-Leahy Scale: Poor                               Pertinent Vitals/Pain Pain Assessment: Faces Faces Pain Scale: Hurts little more Pain Location: peri area from catheter    Home Living Family/patient expects to be discharged to:: Inpatient rehab                 Additional Comments: Pt is not able to give PLOF details    Prior Function Level of Independence: Needs assistance   Gait / Transfers Assistance Needed: using RW with assist at rehab  ADL's / Homemaking Assistance Needed: Inpatient rehab staff to assist self care        Hand Dominance   Dominant Hand: Right    Extremity/Trunk Assessment   Upper Extremity Assessment Upper Extremity Assessment: Generalized weakness    Lower Extremity Assessment Lower Extremity Assessment: Generalized weakness    Cervical / Trunk Assessment Cervical / Trunk Assessment: Kyphotic  Communication   Communication: No difficulties  Cognition Arousal/Alertness: Awake/alert Behavior  During Therapy: Restless;Flat affect Overall Cognitive Status: No family/caregiver present to determine baseline cognitive functioning                                 General Comments: pt is not able to give much history to PT      General Comments General comments (skin integrity, edema, etc.): pt is in bed and requires assistance to side of bed, impulsive at times once up and able to get to [facility] with min assist.  Pt is appropriate for rehab as she is  not aware of safety and not sure of how much help she has at home.    Exercises     Assessment/Plan    PT Assessment Patient needs continued PT services  PT Problem List Decreased strength;Decreased range of motion;Decreased activity tolerance;Decreased balance;Decreased mobility;Decreased cognition;Decreased coordination;Decreased knowledge of use of DME;Decreased safety awareness;Pain       PT Treatment Interventions DME instruction;Gait training;Functional mobility training;Therapeutic activities;Therapeutic exercise;Balance training;Neuromuscular re-education;Patient/family education    PT Goals (Current goals can be found in the Care Plan section)  Acute Rehab PT Goals Patient Stated Goal: to go back to rehab PT Goal Formulation: With patient Time For Goal Achievement: 06/06/18 Potential to Achieve Goals: Good    Frequency Min 2X/week   Barriers to discharge Other (comment)(unsure of home situation)      Co-evaluation               AM-PAC PT "6 Clicks" Mobility  Outcome Measure Help needed turning from your back to your side while in a flat bed without using bedrails?: A Little Help needed moving from lying on your back to sitting on the side of a flat bed without using bedrails?: A Lot Help needed moving to and from a bed to a chair (including a wheelchair)?: A Lot Help needed standing up from a chair using your arms (e.g., wheelchair or bedside chair)?: A Little Help needed to walk in hospital room?: A Little Help needed climbing 3-5 steps with a railing? : Total 6 Click Score: 14    End of Session Equipment Utilized During Treatment: Gait belt Activity Tolerance: Patient limited by fatigue;Other (comment)(generalized weakness) Patient left: in bed;with call bell/phone within reach;with bed alarm set Nurse Communication: Mobility status PT Visit Diagnosis: Unsteadiness on feet (R26.81);Muscle weakness (generalized) (M62.81);Difficulty in walking, not elsewhere  classified (R26.2)    Time: 3419-6222 PT Time Calculation (min) (ACUTE ONLY): 25 min   Charges:   PT Evaluation $PT Eval Moderate Complexity: 1 Mod PT Treatments $Gait Training: 8-22 mins       Ivar Drape 05/23/2018, 3:13 PM  Samul Dada, PT MS Acute Rehab Dept. Number: [facility] [phone] and [facility] [phone]

## 2018-05-23 NOTE — Progress Notes (Addendum)
CRITICAL VALUE ALERT  Critical Value:  Magnesium 0.7  Date & Time Notied:  05/23/18 10:31 AM  Provider Notified: Rizwan,MD  Page to MD   3e19 Schutt Critical Mag of 0.7   Orders Received/Actions taken: see epic.

## 2018-05-23 NOTE — Progress Notes (Signed)
Rehab Admissions Coordinator Note:  Patient was screened by Clois Dupes for appropriateness for an Inpatient Acute Rehab Consult per PT recs. Pt admitted from SNF where she has been since 2/28. Pt wishes to pursue return to SNF if she has SNF days remaining as her preference per SW.  At this time, we are recommending Skilled Nursing Facility.  Suttyn, Deriggi 05/23/2018, 4:56 PM  I can be reached at [phone].

## 2018-05-23 NOTE — Progress Notes (Signed)
2D Echocardiogram has been performed.  [name] 05/23/2018, 3:44 PM

## 2018-05-24 LAB — MAGNESIUM: Magnesium: 1.6 mg/dL — ABNORMAL LOW (ref 1.7–2.4)

## 2018-05-24 LAB — BASIC METABOLIC PANEL
Anion gap: 10 (ref 5–15)
BUN: 21 mg/dL (ref 8–23)
CO2: 32 mmol/L (ref 22–32)
Calcium: 8.2 mg/dL — ABNORMAL LOW (ref 8.9–10.3)
Chloride: 96 mmol/L — ABNORMAL LOW (ref 98–111)
Creatinine, Ser: 1.35 mg/dL — ABNORMAL HIGH (ref 0.44–1.00)
GFR calc Af Amer: 41 mL/min — ABNORMAL LOW (ref >60)
GFR calc non Af Amer: 35 mL/min — ABNORMAL LOW (ref >60)
Glucose, Bld: 101 mg/dL — ABNORMAL HIGH (ref 70–99)
Potassium: 3.8 mmol/L (ref 3.5–5.1)
Sodium: 138 mmol/L (ref 135–145)

## 2018-05-24 MED ORDER — MAGNESIUM SULFATE 2 GM/50ML IV SOLN
2.0000 g | Freq: Once | INTRAVENOUS | Status: AC
  Administered 2018-05-24: 2 g via INTRAVENOUS
  Filled 2018-05-24: qty 50

## 2018-05-24 NOTE — Progress Notes (Signed)
Patient resting comfortably during shift report. Denies complaints.  

## 2018-05-24 NOTE — Progress Notes (Signed)
PROGRESS NOTE    [name]   QKM:[id number]  DOB: 10-22-1929  DOA: 05/22/2018 PCP: Kirstie Peri, MD   Brief Narrative:  [name] is an 85 year old female with coronary artery disease and CABG with chronic biventricular heart failure, peripheral vascular disease status post right carotid endarterectomy, hypertension, chronic kidney disease stage III presents with [facility] for swelling of her legs and hypoxia. She previously weighed 101 pounds on 11/25/2017 and now weighs 136 pounds.   Subjective: No complaints today. Asked if I am going to draw her blood.   Assessment & Plan:   Principal Problem:   Acute on chronic combined systolic and diastolic CHF (congestive heart failure) -EF of 25%-follows with Dr. Purvis Sheffield  -Continue Lasix until able to wean off of oxygen -She is not on an ACE inhibitor or an ARB because she had a cough - Continue carvedilol -Has declined an AICD in the past -The echo has been performed and results are pending - 2 D ECHO > EF 15-20 %, severe right heart failure and diastolic dysfunction  Active Problems:  Elevated troponin Troponin level elevated at 0.053 sets likely secondary to above-mentioned heart failure  Chronic kidney disease stage III - This is stable-follow while diuresing  Peripheral vascular disease status post right CEA Coronary artery disease status post CABG -Previously was on Eliquis but is not on it now - started a baby aspirin    Dyslipidemia -Continue statin    Compression fracture of T11 vertebra (HCC) -Noted on x-ray-"moderate lower thoracic compression fracture approximate T11" -No complaints of back pain but she has had falls which may have resulted in this fracture   Time spent in minutes: 35 DVT prophylaxis: Lovenox Code Status: Not resuscitate Family Communication: No family at bedside Disposition Plan: Return to rehab facility Consultants:   None Procedures:   2D echo     1. The left ventricle has a visually estimated ejection fraction of 15-20%. The cavity size was normal. There is mildly increased left ventricular wall thickness. Left ventricular diastolic Doppler parameters are consistent with restrictive filling.  There is akinesis of the periapical and mid to distal anteroseptal myocardium with otherwise diffuse hypokinesis.  2. The right ventricle has severely reduced systolic function. The cavity was moderately enlarged. There is no increase in right ventricular wall thickness. Right ventricular systolic pressure is severely elevated with an estimated pressure of 65.5  mmHg.  3. Left atrial size was moderately dilated.  4. The aortic valve is tricuspid. Mild to moderate aortic annular calcification noted.  5. The mitral valve is normal in structure. Mild calcification of the mitral valve leaflet. There is moderate mitral annular calcification present. Mitral valve regurgitation is moderate by color flow Doppler.  6. The tricuspid valve is normal in structure. Tricuspid valve regurgitation is moderate-severe.  7. The aortic root is normal in size and structure.  8. The inferior vena cava was normal in size with <50% respiratory variability. Antimicrobials:  Anti-infectives (From admission, onward)   None       Objective: Vitals:   05/23/18 0312 05/24/18 0035 05/24/18 0433 05/24/18 0740  BP:  (!) 116/59 108/60 120/74  Pulse:  70 64 70  Resp:  18 18 18   Temp:  (!) 97.5 F (36.4 C) (!) 97.5 F (36.4 C) 97.7 F (36.5 C)  TempSrc:  Oral Oral Oral  SpO2:  92% 91% 90%  Weight: 59.8 kg  61.1 kg   Height:  Intake/Output Summary (Last 24 hours) at 05/24/2018 0801 Last data filed at 05/24/2018 0732 Gross per 24 hour  Intake 593.65 ml  Output 1000 ml  Net -406.35 ml   Filed Weights   05/22/18 2115 05/23/18 0312 05/24/18 0433  Weight: 60.3 kg 59.8 kg 61.1 kg    Examination: General exam: Appears comfortable  HEENT: PERRLA, oral mucosa  moist, no sclera icterus or thrush Respiratory system: still has coarse crackles at bases- Respiratory effort normal. Cardiovascular system: S1 & S2 heard,  No murmurs  Gastrointestinal system: Abdomen soft, non-tender, nondistended. Normal bowel sounds   Central nervous system: Alert and oriented. No focal neurological deficits. Extremities: No cyanosis, clubbing or edema Skin: No rashes or ulcers Psychiatry:  Mood & affect appropriate.     Data Reviewed: I have personally reviewed following labs and imaging studies  CBC: Recent Labs  Lab 05/22/18 1750 05/23/18 0859  WBC 4.8 5.4  NEUTROABS 3.3  --   HGB 10.3* 10.3*  HCT 34.1* 33.4*  MCV 92.2 90.5  PLT 141* 141*   Basic Metabolic Panel: Recent Labs  Lab 05/22/18 1750 05/23/18 0859  NA 140 138  K 4.0 3.9  CL 94* 95*  CO2 33* 32  GLUCOSE 115* 134*  BUN 18 18  CREATININE 1.45* 1.40*  CALCIUM 8.3* 8.4*  MG  --  0.7*  PHOS  --  3.7   GFR: Estimated Creatinine Clearance: 24.2 mL/min (A) (by C-G formula based on SCr of 1.4 mg/dL (H)). Liver Function Tests: Recent Labs  Lab 05/22/18 1750 05/23/18 0859  AST 37 34  ALT 26 22  ALKPHOS 81 76  BILITOT 0.8 0.9  PROT 6.8 6.8  ALBUMIN 2.7* 2.7*   No results for input(s): LIPASE, AMYLASE in the last 168 hours. No results for input(s): AMMONIA in the last 168 hours. Coagulation Profile: No results for input(s): INR, PROTIME in the last 168 hours. Cardiac Enzymes: Recent Labs  Lab 05/22/18 2124 05/23/18 0255 05/23/18 0859  TROPONINI 0.05* 0.05* 0.05*   BNP (last 3 results) No results for input(s): PROBNP in the last 8760 hours. HbA1C: No results for input(s): HGBA1C in the last 72 hours. CBG: No results for input(s): GLUCAP in the last 168 hours. Lipid Profile: No results for input(s): CHOL, HDL, LDLCALC, TRIG, CHOLHDL, LDLDIRECT in the last 72 hours. Thyroid Function Tests: Recent Labs    05/23/18 0255  TSH 6.132*   Anemia Panel: No results for  input(s): VITAMINB12, FOLATE, FERRITIN, TIBC, IRON, RETICCTPCT in the last 72 hours. Urine analysis:    Component Value Date/Time   COLORURINE YELLOW 12/12/2011 1409   APPEARANCEUR CLEAR 12/12/2011 1409   LABSPEC 1.007 12/12/2011 1409   PHURINE 7.0 12/12/2011 1409   GLUCOSEU NEGATIVE 12/12/2011 1409   HGBUR NEGATIVE 12/12/2011 1409   BILIRUBINUR NEGATIVE 12/12/2011 1409   KETONESUR NEGATIVE 12/12/2011 1409   PROTEINUR NEGATIVE 12/12/2011 1409   UROBILINOGEN 0.2 12/12/2011 1409   NITRITE NEGATIVE 12/12/2011 1409   LEUKOCYTESUR TRACE (A) 12/12/2011 1409   Sepsis Labs: @LABRCNTIP (procalcitonin:4,lacticidven:4) )No results found for this or any previous visit (from the past 240 hour(s)).       Radiology Studies: Dg Chest 2 View  Result Date: 05/22/2018 CLINICAL DATA:  Leg edema EXAM: CHEST - 2 VIEW COMPARISON:  05/07/2018, 03/14/2018, CT 02/03/2018 FINDINGS: Small moderate bilateral pleural effusions, slightly increased on the right side. Stable cardiomegaly with vascular congestion and interstitial pulmonary edema. Worsened basilar airspace disease. Post sternotomy changes. Moderate compression fracture with mild sclerosis of a  lower thoracic vertebra. IMPRESSION: 1. Small moderate bilateral pleural effusions, increased on the right side. Cardiomegaly with vascular congestion and mild pulmonary edema 2. Bilateral lung base atelectasis or pneumonia. 3. Moderate lower thoracic compression fracture, approximate T11, new since CT 02/04/2018 Electronically Signed   By: Jasmine Pang M.D.   On: 05/22/2018 18:40      Scheduled Meds: . aspirin  81 mg Oral Daily  . atorvastatin  40 mg Oral Q2000  . carvedilol  12.5 mg Oral BID WC  . enoxaparin (LOVENOX) injection  30 mg Subcutaneous Q24H  . furosemide  40 mg Intravenous BID  . mirtazapine  15 mg Oral QHS  . pantoprazole  40 mg Oral BID  . sodium chloride flush  3 mL Intravenous Q12H   Continuous Infusions: . sodium chloride    .  sodium chloride Stopped (05/23/18 1841)     LOS: 2 days      Calvert Cantor, MD [facility] Pager: [email or web address] Password [email or web address] 05/24/2018, 8:01 AM

## 2018-05-24 NOTE — Progress Notes (Signed)
Notified Bodenheimer, NP via amion that patient had 20 beat run of vtach vs aflutter.  Patient lying in bed reading her book with no complaints.  Currently in NSR.  Will continue to monitor.

## 2018-05-25 DIAGNOSIS — J9601 Acute respiratory failure with hypoxia: Secondary | ICD-10-CM

## 2018-05-25 LAB — MAGNESIUM: Magnesium: 1.9 mg/dL (ref 1.7–2.4)

## 2018-05-25 LAB — BASIC METABOLIC PANEL
Anion gap: 8 (ref 5–15)
BUN: 22 mg/dL (ref 8–23)
CO2: 35 mmol/L — ABNORMAL HIGH (ref 22–32)
Calcium: 8.3 mg/dL — ABNORMAL LOW (ref 8.9–10.3)
Chloride: 93 mmol/L — ABNORMAL LOW (ref 98–111)
Creatinine, Ser: 1.37 mg/dL — ABNORMAL HIGH (ref 0.44–1.00)
GFR calc Af Amer: 40 mL/min — ABNORMAL LOW (ref >60)
GFR, EST NON AFRICAN AMERICAN: 35 mL/min — AB (ref >60)
GLUCOSE: 115 mg/dL — AB (ref 70–99)
Potassium: 3.5 mmol/L (ref 3.5–5.1)
Sodium: 136 mmol/L (ref 135–145)

## 2018-05-25 MED ORDER — METOLAZONE 5 MG PO TABS
5.0000 mg | ORAL_TABLET | Freq: Once | ORAL | Status: AC
  Administered 2018-05-25: 5 mg via ORAL
  Filled 2018-05-25: qty 1

## 2018-05-25 MED ORDER — FUROSEMIDE 10 MG/ML IJ SOLN
80.0000 mg | Freq: Two times a day (BID) | INTRAMUSCULAR | Status: DC
  Administered 2018-05-25 – 2018-05-27 (×5): 80 mg via INTRAVENOUS
  Filled 2018-05-25 (×5): qty 8

## 2018-05-25 MED ORDER — FUROSEMIDE 10 MG/ML IJ SOLN: 80.0000 mg | Freq: Two times a day (BID) | INTRAMUSCULAR | Status: DC

## 2018-05-25 MED ORDER — SPIRONOLACTONE 25 MG PO TABS
25.0000 mg | ORAL_TABLET | Freq: Two times a day (BID) | ORAL | Status: DC
  Administered 2018-05-25 – 2018-05-29 (×9): 25 mg via ORAL
  Filled 2018-05-25 (×9): qty 1

## 2018-05-25 NOTE — Clinical Social Work Note (Signed)
Per SNF admissions coordinator, patient has transitioned to long-term care at their facility. A Medicaid application was started about a month ago.  Charlynn Court, CSW [phone]

## 2018-05-25 NOTE — Progress Notes (Signed)
PROGRESS NOTE    [name]   [id number]:[id number]  DOB: [birth year]  DOA: 05/22/2018 PCP: Kirstie Peri, MD   Brief Narrative:  [name] is an 85 year old female with coronary artery disease and CABG with chronic biventricular heart failure, peripheral vascular disease status post right carotid endarterectomy, hypertension, chronic kidney disease stage III presents with [facility] for swelling of her legs and hypoxia. She previously weighed 101 pounds on 11/25/2017 and now weighs 136 pounds.   Subjective: No complaints.   Assessment & Plan:   Principal Problem:   Acute on chronic combined systolic and diastolic CHF (congestive heart failure) -EF of 25%-follows with Dr. Purvis Sheffield  -She is not on an ACE inhibitor or an ARB because she had a cough - Continue carvedilol -Has declined an AICD in the past - 2 D ECHO > EF 15-20 %, severe right heart failure and diastolic dysfunction -Continue Lasix until able to wean off of oxygen- we have not made much progress and thus I will add a dose of Zaroxolyn and increase Lasix and add Aldactone  Active Problems:  Elevated troponin Troponin level elevated at 0.053 sets likely secondary to above-mentioned heart failure  Chronic kidney disease stage III - This is stable-follow while diuresing  Peripheral vascular disease status post right CEA Coronary artery disease status post CABG -Previously was on Eliquis but is not on it now - started a baby aspirin    Dyslipidemia -Continue statin    Compression fracture of T11 vertebra (HCC) -Noted on x-ray-"moderate lower thoracic compression fracture approximate T11" -No complaints of back pain but she has had falls which may have resulted in this fracture   Time spent in minutes: 35 DVT prophylaxis: Lovenox Code Status: Do not resuscitate Family Communication: No family at bedside Disposition Plan: Return to rehab facility Consultants:   None Procedures:     2D echo   1. The left ventricle has a visually estimated ejection fraction of 15-20%. The cavity size was normal. There is mildly increased left ventricular wall thickness. Left ventricular diastolic Doppler parameters are consistent with restrictive filling.  There is akinesis of the periapical and mid to distal anteroseptal myocardium with otherwise diffuse hypokinesis.  2. The right ventricle has severely reduced systolic function. The cavity was moderately enlarged. There is no increase in right ventricular wall thickness. Right ventricular systolic pressure is severely elevated with an estimated pressure of 65.5  mmHg.  3. Left atrial size was moderately dilated.  4. The aortic valve is tricuspid. Mild to moderate aortic annular calcification noted.  5. The mitral valve is normal in structure. Mild calcification of the mitral valve leaflet. There is moderate mitral annular calcification present. Mitral valve regurgitation is moderate by color flow Doppler.  6. The tricuspid valve is normal in structure. Tricuspid valve regurgitation is moderate-severe.  7. The aortic root is normal in size and structure.  8. The inferior vena cava was normal in size with <50% respiratory variability. Antimicrobials:  Anti-infectives (From admission, onward)   None       Objective: Vitals:   05/24/18 1759 05/24/18 2006 05/25/18 0407 05/25/18 0623  BP: 128/66 117/70 130/75 115/73  Pulse: 72 72 72 68  Resp:  20 18   Temp:  97.9 F (36.6 C) (!) 97.4 F (36.3 C)   TempSrc:  Oral Oral   SpO2:  93% 92%   Weight:   62.6 kg   Height:        Intake/Output Summary (  Last 24 hours) at 05/25/2018 1015 Last data filed at 05/25/2018 2542 Gross per 24 hour  Intake 273 ml  Output 601 ml  Net -328 ml   Filed Weights   05/24/18 0433 05/24/18 1332 05/25/18 0407  Weight: 61.1 kg 62.5 kg 62.6 kg    Examination: General exam: Appears comfortable  HEENT: PERRLA, oral mucosa moist, no sclera icterus or  thrush Respiratory system: bilateral crackles at bases- Respiratory effort normal. Cardiovascular system: S1 & S2 heard,  No murmurs  Gastrointestinal system: Abdomen soft, non-tender, nondistended. Normal bowel sounds   Central nervous system: Alert and oriented. No focal neurological deficits. Extremities: No cyanosis, clubbing or edema Skin: No rashes or ulcers Psychiatry:  Mood & affect appropriate.      Data Reviewed: I have personally reviewed following labs and imaging studies  CBC: Recent Labs  Lab 05/22/18 1750 05/23/18 0859  WBC 4.8 5.4  NEUTROABS 3.3  --   HGB 10.3* 10.3*  HCT 34.1* 33.4*  MCV 92.2 90.5  PLT 141* 141*   Basic Metabolic Panel: Recent Labs  Lab 05/22/18 1750 05/23/18 0859 05/24/18 0648 05/25/18 0537  NA 140 138 138 136  K 4.0 3.9 3.8 3.5  CL 94* 95* 96* 93*  CO2 33* 32 32 35*  GLUCOSE 115* 134* 101* 115*  BUN 18 18 21 22   CREATININE 1.45* 1.40* 1.35* 1.37*  CALCIUM 8.3* 8.4* 8.2* 8.3*  MG  --  0.7* 1.6* 1.9  PHOS  --  3.7  --   --    GFR: Estimated Creatinine Clearance: 25 mL/min (A) (by C-G formula based on SCr of 1.37 mg/dL (H)). Liver Function Tests: Recent Labs  Lab 05/22/18 1750 05/23/18 0859  AST 37 34  ALT 26 22  ALKPHOS 81 76  BILITOT 0.8 0.9  PROT 6.8 6.8  ALBUMIN 2.7* 2.7*   No results for input(s): LIPASE, AMYLASE in the last 168 hours. No results for input(s): AMMONIA in the last 168 hours. Coagulation Profile: No results for input(s): INR, PROTIME in the last 168 hours. Cardiac Enzymes: Recent Labs  Lab 05/22/18 2124 05/23/18 0255 05/23/18 0859  TROPONINI 0.05* 0.05* 0.05*   BNP (last 3 results) No results for input(s): PROBNP in the last 8760 hours. HbA1C: No results for input(s): HGBA1C in the last 72 hours. CBG: No results for input(s): GLUCAP in the last 168 hours. Lipid Profile: No results for input(s): CHOL, HDL, LDLCALC, TRIG, CHOLHDL, LDLDIRECT in the last 72 hours. Thyroid Function  Tests: Recent Labs    05/23/18 0255  TSH 6.132*   Anemia Panel: No results for input(s): VITAMINB12, FOLATE, FERRITIN, TIBC, IRON, RETICCTPCT in the last 72 hours. Urine analysis:    Component Value Date/Time   COLORURINE YELLOW 12/12/2011 1409   APPEARANCEUR CLEAR 12/12/2011 1409   LABSPEC 1.007 12/12/2011 1409   PHURINE 7.0 12/12/2011 1409   GLUCOSEU NEGATIVE 12/12/2011 1409   HGBUR NEGATIVE 12/12/2011 1409   BILIRUBINUR NEGATIVE 12/12/2011 1409   KETONESUR NEGATIVE 12/12/2011 1409   PROTEINUR NEGATIVE 12/12/2011 1409   UROBILINOGEN 0.2 12/12/2011 1409   NITRITE NEGATIVE 12/12/2011 1409   LEUKOCYTESUR TRACE (A) 12/12/2011 1409   Sepsis Labs: @LABRCNTIP (procalcitonin:4,lacticidven:4) )No results found for this or any previous visit (from the past 240 hour(s)).       Radiology Studies: No results found.    Scheduled Meds: . aspirin  81 mg Oral Daily  . atorvastatin  40 mg Oral Q2000  . carvedilol  12.5 mg Oral BID WC  . enoxaparin (  LOVENOX) injection  30 mg Subcutaneous Q24H  . furosemide  80 mg Intravenous BID  . metolazone  5 mg Oral Once  . mirtazapine  15 mg Oral QHS  . pantoprazole  40 mg Oral BID  . sodium chloride flush  3 mL Intravenous Q12H   Continuous Infusions: . sodium chloride    . sodium chloride 250 mL (05/24/18 1509)     LOS: 3 days      Calvert Cantor, MD [facility] Pager: [email or web address] Password [email or web address] 05/25/2018, 10:15 AM

## 2018-05-25 NOTE — Evaluation (Signed)
Occupational Therapy Evaluation Patient Details Name: [name] MRN: [id number] DOB: Jul 25, 1929 Today's Date: 05/25/2018    History of Present Illness 85 yo female with onset of PNA symptoms and pleural effusion with EF 25% was admitted from a rehab setting in [place].  Pt has been working on strengthening but had onset of CHF, 35# wgt gain, noted T11 compression fracture (prev), pleural effusion.  PMHx:  CABG, CHF, PVD, CAD, HTN, CKD3, R carotid endarterectomy   Clinical Impression   This 85 y/o female presents with the above. PTA pt was in SNF receiving ST rehab services (per chart review pt has transitioned to long term care at facility). Pt reports while at SNF she was performing mobility using RW with assist, receiving some assist for ADL completion. Pt presenting with decreased activity tolerance and generalized weakness. Pt completing stand pivot transfers using RW with minA, currently requires minA for seated UB and self-feeding ADL, maxA for LB ADL. Pt on 2L O2 during activity with lowest sat noted 88%, increased to 94% once seated in recliner end of session. Pt will benefit from continued acute OT services and recommend continue with SNF level therapy services at time of discharge to progress pt towards PLOF. Will follow.     Follow Up Recommendations  SNF;Supervision/Assistance - 24 hour    Equipment Recommendations  Other (comment)(defer to next venue)           Precautions / Restrictions Precautions Precautions: Fall Precaution Comments: monitor vitals; incontinence with mobility Restrictions Weight Bearing Restrictions: No      Mobility Bed Mobility Overal bed mobility: Needs Assistance Bed Mobility: Supine to Sit;Sit to Supine     Supine to sit: Mod assist     General bed mobility comments: assist for trunk and to scoot hips towards EOB  Transfers Overall transfer level: Needs assistance Equipment used: Rolling walker (2 wheeled);1 person hand held  assist Transfers: Sit to/from [employer] Sit to Stand: Min assist Stand pivot transfers: Min assist       General transfer comment: boosting assist and to steady at RW; pt able to take pivotal steps towards recliner, VCs for safe hand placement    Balance Overall balance assessment: Needs assistance   Sitting balance-Leahy Scale: Fair     Standing balance support: Bilateral upper extremity supported Standing balance-Leahy Scale: Poor                             ADL either performed or assessed with clinical judgement   ADL Overall ADL's : Needs assistance/impaired Eating/Feeding: Set up;Sitting Eating/Feeding Details (indicate cue type and reason): setup to open containers, cut up food Grooming: Set up;Sitting   Upper Body Bathing: Minimal assistance;Sitting   Lower Body Bathing: Moderate assistance;Sit to/from stand   Upper Body Dressing : Minimal assistance;Sitting   Lower Body Dressing: Moderate assistance;Sit to/from stand   Toilet Transfer: Minimal assistance;Stand-pivot;RW   Toileting- Clothing Manipulation and Hygiene: Moderate assistance;Sit to/from stand       Functional mobility during ADLs: Minimal assistance;Rolling walker(stand pivot transfer) General ADL Comments: pt with weakness and decreased activity tolerance     Vision         Perception     Praxis      Pertinent Vitals/Pain Pain Assessment: No/denies pain     Hand Dominance Right   Extremity/Trunk Assessment Upper Extremity Assessment Upper Extremity Assessment: Generalized weakness   Lower Extremity Assessment Lower Extremity Assessment: Defer to  PT evaluation   Cervical / Trunk Assessment Cervical / Trunk Assessment: Kyphotic   Communication Communication Communication: No difficulties   Cognition Arousal/Alertness: Awake/alert Behavior During Therapy: WFL for tasks assessed/performed Overall Cognitive Status: Impaired/Different from  baseline Area of Impairment: Orientation                 Orientation Level: Disoriented to;Time("what day is it?")             General Comments: difficult to fully assess and pt very HOH, overall follow commands appropriately    General Comments  pt on 2L O2 during mobility with lowest SpO2 noted 88%, 94% end of session once seated in recliner    Exercises     Shoulder Instructions      Home Living Family/patient expects to be discharged to:: Skilled nursing facility                                 Additional Comments: most recently in SNF for rehab, per chart has transitioned to long term resident      Prior Functioning/Environment Level of Independence: Needs assistance  Gait / Transfers Assistance Needed: using RW with assist at rehab ADL's / Homemaking Assistance Needed: nursing staff asssisting with self-care, prior to recent hopsital stay and d/c to SNF pt reports she was mod independent with ADL            OT Problem List: Decreased strength;Decreased range of motion;Decreased activity tolerance;Impaired balance (sitting and/or standing);Decreased safety awareness;Cardiopulmonary status limiting activity      OT Treatment/Interventions: Self-care/ADL training;Therapeutic exercise;DME and/or AE instruction;Therapeutic activities;Patient/family education;Balance training    OT Goals(Current goals can be found in the care plan section) Acute Rehab OT Goals Patient Stated Goal: to go back to rehab OT Goal Formulation: With patient Time For Goal Achievement: 06/08/18 Potential to Achieve Goals: Good  OT Frequency: Min 2X/week   Barriers to D/C:            Co-evaluation              AM-PAC OT "6 Clicks" Daily Activity     Outcome Measure Help from another person eating meals?: A Little Help from another person taking care of personal grooming?: A Little Help from another person toileting, which includes using toliet, bedpan, or  urinal?: A Lot Help from another person bathing (including washing, rinsing, drying)?: A Lot Help from another person to put on and taking off regular upper body clothing?: A Little Help from another person to put on and taking off regular lower body clothing?: A Lot 6 Click Score: 15   End of Session Equipment Utilized During Treatment: Gait belt;Rolling walker;Oxygen Nurse Communication: Mobility status  Activity Tolerance: Patient tolerated treatment well;Patient limited by fatigue Patient left: in chair;with call bell/phone within reach;with chair alarm set  OT Visit Diagnosis: Unsteadiness on feet (R26.81);Muscle weakness (generalized) (M62.81)                Time: 5449-2010 OT Time Calculation (min): 37 min Charges:  OT General Charges $OT Visit: 1 Visit OT Evaluation $OT Eval Moderate Complexity: 1 Mod OT Treatments $Self Care/Home Management : 8-22 mins  Marcy Siren, OT [facility] Pager [phone] Office [phone]   Orlando Penner 05/25/2018, 1:40 PM

## 2018-05-25 NOTE — NC FL2 (Signed)
[place] MEDICAID FL2 LEVEL OF CARE SCREENING TOOL     IDENTIFICATION  Patient Name: [name] Birthdate: Feb 01, 1930 Sex: female Admission Date (Current Location): 05/22/2018  [facility] and [place] Number:  [employer] and Address:  [facility], [address] [place], [place] [address]      Provider Number: [id number]  Attending Physician Name and Address:  Calvert Cantor, MD  Relative Name and Phone Number:       Current Level of Care: Hospital Recommended Level of Care: Skilled Nursing Facility Prior Approval Number:    Date Approved/Denied:   PASRR Number: [id number]  Discharge Plan: SNF    Current Diagnoses: Patient Active Problem List   Diagnosis Date Noted  . Compression fracture of T11 vertebra (HCC) 05/23/2018  . Acute on chronic combined systolic and diastolic CHF (congestive heart failure) (HCC) 05/22/2018  . Vitamin D deficiency 01/17/2015  . Osteoporosis 01/17/2015  . Pelvic fracture (HCC) 01/14/2015  . Chronic systolic heart failure (HCC) 01/14/2015  . CKD (chronic kidney disease), stage III (HCC) 01/14/2015  . Aftercare following surgery of the circulatory system, NEC 01/19/2013  . Preop cardiovascular exam 12/06/2011  . Transient atrial fibrillation or flutter   . Coronary artery disease   . Hemothorax on left   . Anemia 12/26/2010  . Renal insufficiency 12/26/2010  . Ischemic cardiomyopathy 09/13/2010  . Paroxysmal atrial fibrillation (HCC) 09/13/2010  . Arteriosclerotic cardiovascular disease (ASCVD) 08/08/2010  . Dyslipidemia 08/10/2009  . CAROTID ARTERY DISEASE 08/10/2009  . HTN (hypertension) 12/13/2008  . EDEMA 12/13/2008    Orientation RESPIRATION BLADDER Height & Weight     Self, Time, Situation, Place  O2(Nasal Canula 2 L) Incontinent, External catheter Weight: 137 lb 14.4 oz (62.6 kg) Height:  5\' 1"  (154.9 cm)  BEHAVIORAL SYMPTOMS/MOOD NEUROLOGICAL BOWEL NUTRITION STATUS  (None) (None)  Continent Diet(2 gram sodium. Fluid restriction 1200 mL.)  AMBULATORY STATUS COMMUNICATION OF NEEDS Skin   Limited Assist Verbally Bruising, Other (Comment)(Unstageable pressure injury on middle of both feet: Foam.)                       Personal Care Assistance Level of Assistance  Bathing, Feeding, Dressing Bathing Assistance: Limited assistance Feeding assistance: Independent Dressing Assistance: Limited assistance     Functional Limitations Info  Sight, Hearing, Speech Sight Info: Adequate Hearing Info: Adequate Speech Info: Adequate    SPECIAL CARE FACTORS FREQUENCY  PT (By licensed PT), Blood pressure, OT (By licensed OT)     PT Frequency: 5 x week OT Frequency: 5 x week            Contractures Contractures Info: Not present    Additional Factors Info  Code Status, Allergies Code Status Info: DNR Allergies Info: Lisinopril, Ace Inhibitors, Eliquis (Apixaban), Lipitor (Atorvastatin), Sulfonamide Derivatives.           Current Medications (05/25/2018):  This is the current hospital active medication list Current Facility-Administered Medications  Medication Dose Route Frequency Provider Last Rate Last Dose  . 0.9 %  sodium chloride infusion  250 mL Intravenous PRN Doutova, Anastassia, MD      . 0.9 %  sodium chloride infusion   Intravenous PRN Calvert Cantor, MD 10 mL/hr at 05/24/18 1509 250 mL at 05/24/18 1509  . acetaminophen (TYLENOL) tablet 650 mg  650 mg Oral Q6H PRN Doutova, Anastassia, MD       Or  . acetaminophen (TYLENOL) suppository 650 mg  650 mg Rectal  Q6H PRN Therisa Doyne, MD      . aspirin chewable tablet 81 mg  81 mg Oral Daily Calvert Cantor, MD   81 mg at 05/25/18 0916  . atorvastatin (LIPITOR) tablet 40 mg  40 mg Oral Q2000 Doutova, Anastassia, MD   40 mg at 05/24/18 2219  . carvedilol (COREG) tablet 12.5 mg  12.5 mg Oral BID WC Doutova, Anastassia, MD   12.5 mg at 05/25/18 1941  . enoxaparin (LOVENOX) injection 30 mg  30 mg  Subcutaneous Q24H Doutova, Anastassia, MD   30 mg at 05/24/18 2219  . furosemide (LASIX) injection 80 mg  80 mg Intravenous BID Calvert Cantor, MD   80 mg at 05/25/18 1232  . HYDROcodone-acetaminophen (NORCO/VICODIN) 5-325 MG per tablet 1-2 tablet  1-2 tablet Oral Q4H PRN Doutova, Anastassia, MD      . mirtazapine (REMERON) tablet 15 mg  15 mg Oral QHS Therisa Doyne, MD   15 mg at 05/24/18 2219  . ondansetron (ZOFRAN) tablet 4 mg  4 mg Oral Q6H PRN Doutova, Anastassia, MD       Or  . ondansetron (ZOFRAN) injection 4 mg  4 mg Intravenous Q6H PRN Doutova, Anastassia, MD      . pantoprazole (PROTONIX) EC tablet 40 mg  40 mg Oral BID Therisa Doyne, MD   40 mg at 05/25/18 0916  . sodium chloride flush (NS) 0.9 % injection 3 mL  3 mL Intravenous Q12H Therisa Doyne, MD   3 mL at 05/25/18 0917  . sodium chloride flush (NS) 0.9 % injection 3 mL  3 mL Intravenous PRN Therisa Doyne, MD   3 mL at 05/23/18 2035  . spironolactone (ALDACTONE) tablet 25 mg  25 mg Oral BID Calvert Cantor, MD   25 mg at 05/25/18 1232     Discharge Medications: Please see discharge summary for a list of discharge medications.  Relevant Imaging Results:  Relevant Lab Results:   Additional Information SS#: [id number]  Margarito Liner, LCSW

## 2018-05-26 LAB — BASIC METABOLIC PANEL
Anion gap: 12 (ref 5–15)
BUN: 27 mg/dL — ABNORMAL HIGH (ref 8–23)
CALCIUM: 9.1 mg/dL (ref 8.9–10.3)
CO2: 35 mmol/L — ABNORMAL HIGH (ref 22–32)
Chloride: 90 mmol/L — ABNORMAL LOW (ref 98–111)
Creatinine, Ser: 1.4 mg/dL — ABNORMAL HIGH (ref 0.44–1.00)
GFR calc Af Amer: 39 mL/min — ABNORMAL LOW (ref >60)
GFR calc non Af Amer: 34 mL/min — ABNORMAL LOW (ref >60)
Glucose, Bld: 107 mg/dL — ABNORMAL HIGH (ref 70–99)
Potassium: 3.6 mmol/L (ref 3.5–5.1)
Sodium: 137 mmol/L (ref 135–145)

## 2018-05-26 LAB — MAGNESIUM: Magnesium: 1.7 mg/dL (ref 1.7–2.4)

## 2018-05-26 LAB — MRSA PCR SCREENING: MRSA by PCR: NEGATIVE

## 2018-05-26 MED ORDER — ORAL CARE MOUTH RINSE
15.0000 mL | Freq: Two times a day (BID) | OROMUCOSAL | Status: DC
  Administered 2018-05-27 – 2018-05-29 (×5): 15 mL via OROMUCOSAL

## 2018-05-26 NOTE — Care Management Important Message (Signed)
Important Message  Patient Details  Name: [name] MRN: [id number] Date of Birth: 1929-06-11   Medicare Important Message Given:  Yes    Teng Decou P Rahn Lacuesta 05/26/2018, 5:55 PM

## 2018-05-26 NOTE — Progress Notes (Signed)
PROGRESS NOTE    [name]   PNT:[id number]  DOB: [birth year]  DOA: 05/22/2018 PCP: Kirstie Peri, MD   Brief Narrative:  [name] is an 85 year old female with coronary artery disease and CABG with chronic biventricular heart failure, peripheral vascular disease status post right carotid endarterectomy, hypertension, chronic kidney disease stage III presents with [facility] rehab facility for swelling of her legs and hypoxia. She previously weighed 101 pounds on 11/25/2017 and now weighs 136 pounds.   Subjective: No complaints today. She is very pleased that she urinated well yesterday.   Assessment & Plan:   Principal Problem:   Acute on chronic combined systolic and diastolic CHF (congestive heart failure) -EF of 25%-follows with Dr. Purvis Sheffield  -She is not on an ACE inhibitor or an ARB because she had a cough - Continue carvedilol -Has declined an AICD in the past - 2 D ECHO > EF 15-20 %, severe right heart failure and diastolic dysfunction  - 3/9>  we have not made much progress and thus I will add a dose of Zaroxolyn, increase Lasix and add Aldactone - 3/10 - diuresing well now- Cr and BP are stable-I have weaned her O2 down to 1 L today- may be able to be discharged tomorrow if she continues to do this well  Active Problems:  Hypomagnesemia - Mag 0.7- has been adequately replaced  Elevated troponin Troponin level elevated at 0.053 sets likely secondary to above-mentioned heart failure  Chronic kidney disease stage III - This is stable-follow while diuresing  Peripheral vascular disease status post right CEA Coronary artery disease status post CABG -Previously was on Eliquis but is not on it now - I started a baby aspirin    Dyslipidemia -Continue statin    Compression fracture of T11 vertebra (HCC) -Noted on x-ray-"moderate lower thoracic compression fracture approximate T11" -No complaints of back pain but she has had falls which may have  resulted in this fracture   Time spent in minutes: 35 DVT prophylaxis: Lovenox Code Status: Do not resuscitate Family Communication: No family at bedside Disposition Plan: Return to rehab facility maybe tomorrow based on diuresis Consultants:   None Procedures:   2D echo   1. The left ventricle has a visually estimated ejection fraction of 15-20%. The cavity size was normal. There is mildly increased left ventricular wall thickness. Left ventricular diastolic Doppler parameters are consistent with restrictive filling.  There is akinesis of the periapical and mid to distal anteroseptal myocardium with otherwise diffuse hypokinesis.  2. The right ventricle has severely reduced systolic function. The cavity was moderately enlarged. There is no increase in right ventricular wall thickness. Right ventricular systolic pressure is severely elevated with an estimated pressure of 65.5  mmHg.  3. Left atrial size was moderately dilated.  4. The aortic valve is tricuspid. Mild to moderate aortic annular calcification noted.  5. The mitral valve is normal in structure. Mild calcification of the mitral valve leaflet. There is moderate mitral annular calcification present. Mitral valve regurgitation is moderate by color flow Doppler.  6. The tricuspid valve is normal in structure. Tricuspid valve regurgitation is moderate-severe.  7. The aortic root is normal in size and structure.  8. The inferior vena cava was normal in size with <50% respiratory variability. Antimicrobials:  Anti-infectives (From admission, onward)   None       Objective: Vitals:   05/26/18 0609 05/26/18 0836 05/26/18 0935 05/26/18 1202  BP:  (!) 116/48 (!) 117/56 113/63  Pulse:  70 64 73  Resp:  14  18  Temp:  97.8 F (36.6 C)  97.8 F (36.6 C)  TempSrc:  Oral  Oral  SpO2:  96% 94% 93%  Weight: 61.2 kg     Height:        Intake/Output Summary (Last 24 hours) at 05/26/2018 1302 Last data filed at 05/26/2018  1229 Gross per 24 hour  Intake 610.58 ml  Output 4500 ml  Net -3889.42 ml   Filed Weights   05/24/18 1332 05/25/18 0407 05/26/18 0609  Weight: 62.5 kg 62.6 kg 61.2 kg    Examination: General exam: Appears comfortable  HEENT: PERRLA, oral mucosa moist, no sclera icterus or thrush Respiratory system: coarse b/l basilar crackles, 88-92% on room air at rest (checked myself) Respiratory effort normal. Cardiovascular system: S1 & S2 heard,  No murmurs  Gastrointestinal system: Abdomen soft, non-tender, nondistended. Normal bowel sounds   Central nervous system: Alert and oriented. No focal neurological deficits. Extremities: No cyanosis, clubbing- mild pedal edema Skin: No rashes or ulcers Psychiatry:  Mood & affect appropriate.       Data Reviewed: I have personally reviewed following labs and imaging studies  CBC: Recent Labs  Lab 05/22/18 1750 05/23/18 0859  WBC 4.8 5.4  NEUTROABS 3.3  --   HGB 10.3* 10.3*  HCT 34.1* 33.4*  MCV 92.2 90.5  PLT 141* 141*   Basic Metabolic Panel: Recent Labs  Lab 05/22/18 1750 05/23/18 0859 05/24/18 0648 05/25/18 0537 05/26/18 0445  NA 140 138 138 136 137  K 4.0 3.9 3.8 3.5 3.6  CL 94* 95* 96* 93* 90*  CO2 33* 32 32 35* 35*  GLUCOSE 115* 134* 101* 115* 107*  BUN 18 18 21 22  27*  CREATININE 1.45* 1.40* 1.35* 1.37* 1.40*  CALCIUM 8.3* 8.4* 8.2* 8.3* 9.1  MG  --  0.7* 1.6* 1.9 1.7  PHOS  --  3.7  --   --   --    GFR: Estimated Creatinine Clearance: 24.2 mL/min (A) (by C-G formula based on SCr of 1.4 mg/dL (H)). Liver Function Tests: Recent Labs  Lab 05/22/18 1750 05/23/18 0859  AST 37 34  ALT 26 22  ALKPHOS 81 76  BILITOT 0.8 0.9  PROT 6.8 6.8  ALBUMIN 2.7* 2.7*   No results for input(s): LIPASE, AMYLASE in the last 168 hours. No results for input(s): AMMONIA in the last 168 hours. Coagulation Profile: No results for input(s): INR, PROTIME in the last 168 hours. Cardiac Enzymes: Recent Labs  Lab 05/22/18 2124  05/23/18 0255 05/23/18 0859  TROPONINI 0.05* 0.05* 0.05*   BNP (last 3 results) No results for input(s): PROBNP in the last 8760 hours. HbA1C: No results for input(s): HGBA1C in the last 72 hours. CBG: No results for input(s): GLUCAP in the last 168 hours. Lipid Profile: No results for input(s): CHOL, HDL, LDLCALC, TRIG, CHOLHDL, LDLDIRECT in the last 72 hours. Thyroid Function Tests: No results for input(s): TSH, T4TOTAL, FREET4, T3FREE, THYROIDAB in the last 72 hours. Anemia Panel: No results for input(s): VITAMINB12, FOLATE, FERRITIN, TIBC, IRON, RETICCTPCT in the last 72 hours. Urine analysis:    Component Value Date/Time   COLORURINE YELLOW 12/12/2011 1409   APPEARANCEUR CLEAR 12/12/2011 1409   LABSPEC 1.007 12/12/2011 1409   PHURINE 7.0 12/12/2011 1409   GLUCOSEU NEGATIVE 12/12/2011 1409   HGBUR NEGATIVE 12/12/2011 1409   BILIRUBINUR NEGATIVE 12/12/2011 1409   KETONESUR NEGATIVE 12/12/2011 1409   PROTEINUR NEGATIVE 12/12/2011 1409  UROBILINOGEN 0.2 12/12/2011 1409   NITRITE NEGATIVE 12/12/2011 1409   LEUKOCYTESUR TRACE (A) 12/12/2011 1409   Sepsis Labs: @LABRCNTIP (procalcitonin:4,lacticidven:4) )No results found for this or any previous visit (from the past 240 hour(s)).       Radiology Studies: No results found.    Scheduled Meds: . aspirin  81 mg Oral Daily  . atorvastatin  40 mg Oral Q2000  . carvedilol  12.5 mg Oral BID WC  . enoxaparin (LOVENOX) injection  30 mg Subcutaneous Q24H  . furosemide  80 mg Intravenous BID  . mouth rinse  15 mL Mouth Rinse BID  . mirtazapine  15 mg Oral QHS  . pantoprazole  40 mg Oral BID  . sodium chloride flush  3 mL Intravenous Q12H  . spironolactone  25 mg Oral BID   Continuous Infusions: . sodium chloride    . sodium chloride Stopped (05/24/18 1803)     LOS: 4 days      Calvert Cantor, MD [facility] Pager: [email or web address] Password [email or web address] 05/26/2018, 1:02 PM

## 2018-05-26 NOTE — Progress Notes (Signed)
Physical Therapy Treatment Patient Details Name: [name] MRN: [id number] DOB: 1930/01/28 Today's Date: 05/26/2018    History of Present Illness Pt is an 85 y.o. female admitted from SNF on 05/22/18 with with hypoxia and BEL swelling; worked up for CHF exacerbation. Imaging noted moderate T11 compression fx; likely resulted from previous falls. PMH includes CHF, PVD, CAD, HTN, CKD3.    PT Comments    Pt progressing with mobility. Today's session focused on repeated sit<>stands and gait training with RW. Pt requiring min guard to minA with this. Remains limited by generalized weakness and decreased activity tolerance. SpO2 down to 80% on RA with mobility; required 3L O2 [place] and seated rest to achieve SpO2 >90% (see saturations qualifications note). Pt reports difficulty breathing through O2 nasal canula secondary to previous nose fx from fall. Pt motivated to continue working with SNF-level therapies at d/c.    Follow Up Recommendations  SNF;Supervision for mobility/OOB     Equipment Recommendations  None recommended by PT    Recommendations for Other Services       Precautions / Restrictions Precautions Precautions: Fall;Other (comment) Precaution Comments: Watch SpO2; urine incontinence with mobility Restrictions Weight Bearing Restrictions: No    Mobility  Bed Mobility               General bed mobility comments: Received sitting in recliner  Transfers Overall transfer level: Needs assistance Equipment used: Rolling walker (2 wheeled) Transfers: Sit to/from Stand Sit to Stand: Min assist         General transfer comment: Performed multiple sit<>stands from recliner and BSC with min guard to minA for trunk elevation and balance; cues for hand placement on RW. Pt with good eccentric control into sitting  Ambulation/Gait Ambulation/Gait assistance: Min assist;Min guard Gait Distance (Feet): 5 Feet Assistive device: Rolling walker (2 wheeled) Gait  Pattern/deviations: Step-to pattern;Trunk flexed Gait velocity: Decreased   General Gait Details: Pt took pivotal steps from recliner<>BSC with RW and close min guard; urine incontinence while standing. Performed 3x bouts of marching in place with RW; SpO2 down to 80% on RA, returning to 92% with 3L O2 [place] and seated rest   Stairs             Wheelchair Mobility    Modified Rankin (Stroke Patients Only)       Balance Overall balance assessment: Needs assistance   Sitting balance-Leahy Scale: Fair     Standing balance support: Single extremity supported Standing balance-Leahy Scale: Poor Standing balance comment: Reliant on UE support; dependent for pericare while standing                            Cognition Arousal/Alertness: Awake/alert Behavior During Therapy: WFL for tasks assessed/performed Overall Cognitive Status: No family/caregiver present to determine baseline cognitive functioning Area of Impairment: Attention;Awareness                   Current Attention Level: Selective       Awareness: Emergent   General Comments: WFL for simple tasks; tangential with speech requiring frequent redirection. Difficult to truly assess as pt very Firefighter Exercises - Lower Extremity Long Arc Quad: AROM;Both;Seated    General Comments General comments (skin integrity, edema, etc.): Reports difficulty breathing deep through O2 nasal canula due to previous nose fx from fall      Pertinent Vitals/Pain Pain Assessment: Faces Faces Pain Scale: Hurts a little  bit Pain Location: Perineal area with pericare and catheter placement Pain Descriptors / Indicators: Sore;Grimacing Pain Intervention(s): Limited activity within patient's tolerance;Monitored during session    Home Living                      Prior Function            PT Goals (current goals can now be found in the care plan section) Acute Rehab PT Goals Patient  Stated Goal: to go back to rehab PT Goal Formulation: With patient Time For Goal Achievement: 06/06/18 Potential to Achieve Goals: Good Progress towards PT goals: Progressing toward goals    Frequency    Min 2X/week      PT Plan Current plan remains appropriate    Co-evaluation              AM-PAC PT "6 Clicks" Mobility   Outcome Measure  Help needed turning from your back to your side while in a flat bed without using bedrails?: A Little Help needed moving from lying on your back to sitting on the side of a flat bed without using bedrails?: A Little Help needed moving to and from a bed to a chair (including a wheelchair)?: A Little Help needed standing up from a chair using your arms (e.g., wheelchair or bedside chair)?: A Little Help needed to walk in hospital room?: A Little Help needed climbing 3-5 steps with a railing? : A Lot 6 Click Score: 17    End of Session Equipment Utilized During Treatment: Oxygen Activity Tolerance: Patient tolerated treatment well Patient left: in chair;with call bell/phone within reach Nurse Communication: Mobility status PT Visit Diagnosis: Unsteadiness on feet (R26.81);Muscle weakness (generalized) (M62.81);Difficulty in walking, not elsewhere classified (R26.2)     Time: 1100-1130 PT Time Calculation (min) (ACUTE ONLY): 30 min  Charges:  $Gait Training: 8-22 mins $Therapeutic Exercise: 8-22 mins                    Ina Homes, PT, DPT Acute Rehabilitation Services  Pager [phone] Office [phone]  Malachy Chamber 05/26/2018, 12:00 PM

## 2018-05-26 NOTE — Plan of Care (Signed)
  Problem: Education: Goal: Knowledge of General Education information will improve Description Including pain rating scale, medication(s)/side effects and non-pharmacologic comfort measures Outcome: Progressing   Problem: Health Behavior/Discharge Planning: Goal: Ability to manage health-related needs will improve Outcome: Progressing   

## 2018-05-26 NOTE — Progress Notes (Signed)
While trying to wean room air: patient was on 2L, RN changed O2 settings to 1L, O2 sats were about 90%, but with exertion O2 sats dropped to the 80's. Required 3L while working with PT. Now O2 is on 2L. Will keep at 2L.

## 2018-05-26 NOTE — Progress Notes (Signed)
SATURATION QUALIFICATIONS: (This note is used to comply with regulatory documentation for home oxygen)  Patient Saturations on Room Air at Rest = 90%  Patient Saturations on Room Air while Ambulating = 80%  Patient Saturations on 3 Liters of oxygen while Ambulating = 92%  Please briefly explain why patient needs home oxygen: Pt currently requires supplemental oxygen to maintain SpO2 >/88% with mobility.  Ina Homes, PT, DPT Acute Rehabilitation Services  Pager [phone] Office [phone]

## 2018-05-27 DIAGNOSIS — L89899 Pressure ulcer of other site, unspecified stage: Secondary | ICD-10-CM

## 2018-05-27 DIAGNOSIS — I5033 Acute on chronic diastolic (congestive) heart failure: Secondary | ICD-10-CM

## 2018-05-27 DIAGNOSIS — L8990 Pressure ulcer of unspecified site, unspecified stage: Secondary | ICD-10-CM

## 2018-05-27 LAB — BASIC METABOLIC PANEL
Anion gap: 14 (ref 5–15)
BUN: 31 mg/dL — ABNORMAL HIGH (ref 8–23)
CHLORIDE: 87 mmol/L — AB (ref 98–111)
CO2: 34 mmol/L — ABNORMAL HIGH (ref 22–32)
CREATININE: 1.45 mg/dL — AB (ref 0.44–1.00)
Calcium: 9.4 mg/dL (ref 8.9–10.3)
GFR calc Af Amer: 38 mL/min — ABNORMAL LOW (ref >60)
GFR calc non Af Amer: 33 mL/min — ABNORMAL LOW (ref >60)
Glucose, Bld: 128 mg/dL — ABNORMAL HIGH (ref 70–99)
Potassium: 3.6 mmol/L (ref 3.5–5.1)
Sodium: 135 mmol/L (ref 135–145)

## 2018-05-27 LAB — MAGNESIUM: Magnesium: 1.4 mg/dL — ABNORMAL LOW (ref 1.7–2.4)

## 2018-05-27 MED ORDER — POTASSIUM CHLORIDE CRYS ER 20 MEQ PO TBCR
40.0000 meq | EXTENDED_RELEASE_TABLET | Freq: Once | ORAL | Status: AC
  Administered 2018-05-27: 40 meq via ORAL
  Filled 2018-05-27: qty 2

## 2018-05-27 MED ORDER — SENNOSIDES-DOCUSATE SODIUM 8.6-50 MG PO TABS
2.0000 | ORAL_TABLET | Freq: Every day | ORAL | Status: DC | PRN
  Administered 2018-05-27: 2 via ORAL
  Filled 2018-05-27: qty 2

## 2018-05-27 MED ORDER — FUROSEMIDE 10 MG/ML IJ SOLN
80.0000 mg | Freq: Three times a day (TID) | INTRAMUSCULAR | Status: DC
  Administered 2018-05-27 – 2018-05-29 (×6): 80 mg via INTRAVENOUS
  Filled 2018-05-27 (×6): qty 8

## 2018-05-27 NOTE — Plan of Care (Signed)
  Problem: Education: Goal: Knowledge of General Education information will improve Description Including pain rating scale, medication(s)/side effects and non-pharmacologic comfort measures Outcome: Progressing   Problem: Health Behavior/Discharge Planning: Goal: Ability to manage health-related needs will improve Outcome: Progressing   

## 2018-05-27 NOTE — Progress Notes (Signed)
PROGRESS NOTE    [name]  YPP:[id number] DOB: May 01, 1929 DOA: 05/22/2018 PCP: Kirstie Peri, MD    Brief Narrative:  85 year old female who presented from the skilled nursing facility with lower extremity edema.  She does have history of coronary artery disease status post CABG, heart failure, chronic kidney disease stage III, hypertension, peripheral vascular disease and remote history of PE.  Reported progressive weight gain about 26 pounds over the last 6 months, worsening lower extremity edema and increased oxygen requirements.  She was sent to the hospital for further evaluation.  On her initial physical examination her blood pressure was 133/89, pulse rate 69, temperature 97.8, respiratory rate 25, oxygen saturation 100% on supplemental oxygen.  Her lungs are clear to auscultation bilaterally, heart S1-S2 present rhythmic, abdomen was soft nontender, +4+ bilateral lower extremity edema  Patient was admitted to the hospital working diagnosis acute on chronic systolic heart failure decompensation.  Assessment & Plan:   Principal Problem:   Acute on chronic combined systolic and diastolic CHF (congestive heart failure) (HCC) Active Problems:   Dyslipidemia   HTN (hypertension)   Paroxysmal atrial fibrillation (HCC)   Coronary artery disease   CKD (chronic kidney disease), stage III (HCC)   Compression fracture of T11 vertebra (HCC)   Pressure injury of skin   1. Acute on chronic systolic heart failure decompensation. Patient continue to have severe lower extremity edema, ++++ bilaterally and up to the thighs. Urine output over last 24 H, 1,250 ml with furosemide 80 mg bid, will increase to 80 mg tid to improve diuresis. Blood pressure systolic is 120 to 130 mmHg. Continue 25 mg spironolactone bid.   2. CKD stage 3 with hypokalemia. Stable renal function with serum cr at 1,45 with K at 3,6 and serum bicarbonate at 34., will continue to follow on renal panel in am. Continue K  correction with kcl.   3. Paroxysmal atrial fibrillation. Continue rate control with  Carvedilol, not on anticoagullation. Continue telemetry monitoring.   4. Hypertension. Continue blood pressure control with carvedilol, and spironolactone.    5. Coronary artery disease. No active chest pain, continue antiplatelet therapy and statin.   6. Dyslipidemia. Continue statin therapy.   7. Bilateral feet, pressure ulcer, unstageable. Present on admission. Continue local wound care.  DVT prophylaxis: enoxaparin   Code Status:  full Family Communication: no family at the bedside  Disposition Plan/ discharge barriers: pending clinical improvement.   Body mass index is 24.07 kg/m. Malnutrition Type:      Malnutrition Characteristics:      Nutrition Interventions:     RN Pressure Injury Documentation: Pressure Injury 05/22/18 Unstageable - Full thickness tissue loss in which the base of the ulcer is covered by slough (yellow, tan, gray, green or brown) and/or eschar (tan, brown or black) in the wound bed. (Active)  05/22/18 2115  Location: Foot  Location Orientation: Bilateral;Mid  Staging: Unstageable - Full thickness tissue loss in which the base of the ulcer is covered by slough (yellow, tan, gray, green or brown) and/or eschar (tan, brown or black) in the wound bed.  Wound Description (Comments):   Present on Admission:      Consultants:     Procedures:     Antimicrobials:       Subjective: Patient continue to have significant lowe extremity edema and difficulty moving,  No chest pain, no nausea or vomiting. Patient continue to be very weak and deconditioned.   Objective: Vitals:   05/26/18 2051 05/27/18 0340 05/27/18  4158 05/27/18 1144  BP: 116/66 122/60 138/61 122/60  Pulse: 65 79 75 66  Resp: 18 20  15   Temp: (!) 97.5 F (36.4 C) 97.8 F (36.6 C)    TempSrc: Oral Oral    SpO2: 91% 93% 100% 99%  Weight:  57.8 kg    Height:        Intake/Output  Summary (Last 24 hours) at 05/27/2018 1312 Last data filed at 05/27/2018 1200 Gross per 24 hour  Intake 960 ml  Output 850 ml  Net 110 ml   Filed Weights   05/25/18 0407 05/26/18 0609 05/27/18 0340  Weight: 62.6 kg 61.2 kg 57.8 kg    Examination:   General: deconditioned and ill looking appearing  Neurology: Awake and alert, non focal  E ENT: mild pallor, no icterus, oral mucosa moist Cardiovascular: No JVD. S1-S2 present, rhythmic, no gallops, rubs, or murmurs. +++/++++ pitting lower extremity edema, up to the thighs. Pulmonary: posiitve breath sounds bilaterally, decreased air movement at bases, no wheezing, rhonchi or rales. Gastrointestinal. Abdomen with no organomegaly, non tender, no rebound or guarding Skin. No rashes Musculoskeletal: no joint deformities     Data Reviewed: I have personally reviewed following labs and imaging studies  CBC: Recent Labs  Lab 05/22/18 1750 05/23/18 0859  WBC 4.8 5.4  NEUTROABS 3.3  --   HGB 10.3* 10.3*  HCT 34.1* 33.4*  MCV 92.2 90.5  PLT 141* 141*   Basic Metabolic Panel: Recent Labs  Lab 05/22/18 1750 05/23/18 0859 05/24/18 0648 05/25/18 0537 05/26/18 0445 05/27/18 0457  NA 140 138 138 136 137  --   K 4.0 3.9 3.8 3.5 3.6  --   CL 94* 95* 96* 93* 90*  --   CO2 33* 32 32 35* 35*  --   GLUCOSE 115* 134* 101* 115* 107*  --   BUN 18 18 21 22  27*  --   CREATININE 1.45* 1.40* 1.35* 1.37* 1.40*  --   CALCIUM 8.3* 8.4* 8.2* 8.3* 9.1  --   MG  --  0.7* 1.6* 1.9 1.7 1.4*  PHOS  --  3.7  --   --   --   --    GFR: Estimated Creatinine Clearance: 23.6 mL/min (A) (by C-G formula based on SCr of 1.4 mg/dL (H)). Liver Function Tests: Recent Labs  Lab 05/22/18 1750 05/23/18 0859  AST 37 34  ALT 26 22  ALKPHOS 81 76  BILITOT 0.8 0.9  PROT 6.8 6.8  ALBUMIN 2.7* 2.7*   No results for input(s): LIPASE, AMYLASE in the last 168 hours. No results for input(s): AMMONIA in the last 168 hours. Coagulation Profile: No results for  input(s): INR, PROTIME in the last 168 hours. Cardiac Enzymes: Recent Labs  Lab 05/22/18 2124 05/23/18 0255 05/23/18 0859  TROPONINI 0.05* 0.05* 0.05*   BNP (last 3 results) No results for input(s): PROBNP in the last 8760 hours. HbA1C: No results for input(s): HGBA1C in the last 72 hours. CBG: No results for input(s): GLUCAP in the last 168 hours. Lipid Profile: No results for input(s): CHOL, HDL, LDLCALC, TRIG, CHOLHDL, LDLDIRECT in the last 72 hours. Thyroid Function Tests: No results for input(s): TSH, T4TOTAL, FREET4, T3FREE, THYROIDAB in the last 72 hours. Anemia Panel: No results for input(s): VITAMINB12, FOLATE, FERRITIN, TIBC, IRON, RETICCTPCT in the last 72 hours.    Radiology Studies: I have reviewed all of the imaging during this hospital visit personally     Scheduled Meds: . aspirin  81 mg  Oral Daily  . atorvastatin  40 mg Oral Q2000  . carvedilol  12.5 mg Oral BID WC  . enoxaparin (LOVENOX) injection  30 mg Subcutaneous Q24H  . furosemide  80 mg Intravenous BID  . mouth rinse  15 mL Mouth Rinse BID  . mirtazapine  15 mg Oral QHS  . pantoprazole  40 mg Oral BID  . sodium chloride flush  3 mL Intravenous Q12H  . spironolactone  25 mg Oral BID   Continuous Infusions: . sodium chloride    . sodium chloride Stopped (05/24/18 1803)     LOS: 5 days        [name] Annett Gula, MD

## 2018-05-28 LAB — BASIC METABOLIC PANEL
Anion gap: 11 (ref 5–15)
BUN: 30 mg/dL — ABNORMAL HIGH (ref 8–23)
CO2: 39 mmol/L — ABNORMAL HIGH (ref 22–32)
Calcium: 9.6 mg/dL (ref 8.9–10.3)
Chloride: 88 mmol/L — ABNORMAL LOW (ref 98–111)
Creatinine, Ser: 1.53 mg/dL — ABNORMAL HIGH (ref 0.44–1.00)
GFR calc Af Amer: 35 mL/min — ABNORMAL LOW (ref >60)
GFR calc non Af Amer: 30 mL/min — ABNORMAL LOW (ref >60)
Glucose, Bld: 105 mg/dL — ABNORMAL HIGH (ref 70–99)
Potassium: 3.9 mmol/L (ref 3.5–5.1)
SODIUM: 138 mmol/L (ref 135–145)

## 2018-05-28 LAB — MAGNESIUM: Magnesium: 1.3 mg/dL — ABNORMAL LOW (ref 1.7–2.4)

## 2018-05-28 MED ORDER — CARVEDILOL 12.5 MG PO TABS
12.5000 mg | ORAL_TABLET | Freq: Once | ORAL | Status: AC
  Administered 2018-05-28: 12.5 mg via ORAL
  Filled 2018-05-28: qty 1

## 2018-05-28 MED ORDER — MAGNESIUM SULFATE 2 GM/50ML IV SOLN
2.0000 g | Freq: Once | INTRAVENOUS | Status: AC
  Administered 2018-05-28: 2 g via INTRAVENOUS
  Filled 2018-05-28: qty 50

## 2018-05-28 MED ORDER — CARVEDILOL 25 MG PO TABS
25.0000 mg | ORAL_TABLET | Freq: Two times a day (BID) | ORAL | Status: DC
  Administered 2018-05-29: 25 mg via ORAL
  Filled 2018-05-28: qty 1

## 2018-05-28 NOTE — Progress Notes (Signed)
PROGRESS NOTE    [name]  BJS:[id number] DOB: May 08, 1929 DOA: 05/22/2018 PCP: Kirstie Peri, MD    Brief Narrative:  85 year old female who presented from the skilled nursing facility with lower extremity edema.  She does have history of coronary artery disease status post CABG, heart failure, chronic kidney disease stage III, hypertension, peripheral vascular disease and remote history of PE.  Reported progressive weight gain about 26 pounds over the last 6 months, worsening lower extremity edema and increased oxygen requirements.  She was sent to the hospital for further evaluation.  On her initial physical examination her blood pressure was 133/89, pulse rate 69, temperature 97.8, respiratory rate 25, oxygen saturation 100% on supplemental oxygen.  Her lungs are clear to auscultation bilaterally, heart S1-S2 present rhythmic, abdomen was soft nontender, +4+ bilateral lower extremity edema  Patient was admitted to the hospital working diagnosis acute on chronic systolic heart failure decompensation.    Assessment & Plan:   Principal Problem:   Acute on chronic combined systolic and diastolic CHF (congestive heart failure) (HCC) Active Problems:   Dyslipidemia   HTN (hypertension)   Paroxysmal atrial fibrillation (HCC)   Coronary artery disease   CKD (chronic kidney disease), stage III (HCC)   Compression fracture of T11 vertebra (HCC)   Pressure injury of skin  1. Acute on chronic systolic heart failure decompensation. Urine output over last 24 H, 2,500 ml. Furosemide 80 mg tid. Blood pressure systolic has remained stable at 122 mmHg. Continue 25 mg spironolactone bid.   2. CKD stage 3 with hypokalemia and hypomagensemia Stable renal function with serum cr at 1,53 with K at 3,9 and serum bicarbonate at 39. Patient with persistent hypervolemia, will continue aggressive furosemide and will follow on renal panel in am. Avoid hypotension and nephrotoxic medications. Magnesium  correction with Mag sulfate IV.   3. Paroxysmal atrial fibrillation. Carvedilol for rate control, currently with no anticoagulation. Continue telemetry monitoring.  4. Hypertension. Continue carvedilol, and spironolactone.  On high doses of furosemide.   5. Coronary artery disease. Tolerating well antiplatelet therapy and statin.   6. Dyslipidemia. On statin therapy.   7. Bilateral feet, pressure ulcer, unstageable. Present on admission. Continue local wound care, per unit protocol.   DVT prophylaxis: enoxaparin   Code Status:  full Family Communication: no family at the bedside  Disposition Plan/ discharge barriers: pending clinical improvement.  Body mass index is 23.71 kg/m. Malnutrition Type:      Malnutrition Characteristics:      Nutrition Interventions:     RN Pressure Injury Documentation: Pressure Injury 05/22/18 Unstageable - Full thickness tissue loss in which the base of the ulcer is covered by slough (yellow, tan, gray, green or brown) and/or eschar (tan, brown or black) in the wound bed. (Active)  05/22/18 2115  Location: Foot  Location Orientation: Bilateral;Mid  Staging: Unstageable - Full thickness tissue loss in which the base of the ulcer is covered by slough (yellow, tan, gray, green or brown) and/or eschar (tan, brown or black) in the wound bed.  Wound Description (Comments):   Present on Admission:      Consultants:     Procedures:     Antimicrobials:       Subjective: Patient reports improvement  In her lower extremity edema, feeling less tight, no worsening dyspnea or chest pain. No nausea or vomiting.   Objective: Vitals:   05/27/18 2017 05/28/18 0554 05/28/18 0807 05/28/18 1215  BP: (!) 110/54 134/77 124/66 122/60  Pulse: 67 72  73 70  Resp: 20  18 18   Temp: 98 F (36.7 C) (!) 97.3 F (36.3 C) 97.8 F (36.6 C) 97.9 F (36.6 C)  TempSrc: Oral Oral Oral Oral  SpO2: 94% 100% 98% 98%  Weight:  56.9 kg    Height:         Intake/Output Summary (Last 24 hours) at 05/28/2018 1357 Last data filed at 05/28/2018 1322 Gross per 24 hour  Intake 600 ml  Output 3000 ml  Net -2400 ml   Filed Weights   05/26/18 0609 05/27/18 0340 05/28/18 0554  Weight: 61.2 kg 57.8 kg 56.9 kg    Examination:   General: deconditioned and ill looking appearing  Neurology: Awake and alert, non focal  E ENT: mild pallor, no icterus, oral mucosa moist Cardiovascular: No JVD. S1-S2 present, rhythmic, no gallops, rubs, or murmurs. ++/+++ pitting bilatereal lower extremity edema, predominantly at the thighs.  Pulmonary: positive breath sounds bilaterally, decreased air movement, with no wheezing, rhonchi or rales. Gastrointestinal. Abdomen with no organomegaly, non tender, no rebound or guarding. No frank abdominal wall edema.  Skin. No rashes Musculoskeletal: no joint deformities     Data Reviewed: I have personally reviewed following labs and imaging studies  CBC: Recent Labs  Lab 05/22/18 1750 05/23/18 0859  WBC 4.8 5.4  NEUTROABS 3.3  --   HGB 10.3* 10.3*  HCT 34.1* 33.4*  MCV 92.2 90.5  PLT 141* 141*   Basic Metabolic Panel: Recent Labs  Lab 05/23/18 0859 05/24/18 0648 05/25/18 0537 05/26/18 0445 05/27/18 0457 05/27/18 1343 05/28/18 0544  NA 138 138 136 137  --  135 138  K 3.9 3.8 3.5 3.6  --  3.6 3.9  CL 95* 96* 93* 90*  --  87* 88*  CO2 32 32 35* 35*  --  34* 39*  GLUCOSE 134* 101* 115* 107*  --  128* 105*  BUN 18 21 22  27*  --  31* 30*  CREATININE 1.40* 1.35* 1.37* 1.40*  --  1.45* 1.53*  CALCIUM 8.4* 8.2* 8.3* 9.1  --  9.4 9.6  MG 0.7* 1.6* 1.9 1.7 1.4*  --  1.3*  PHOS 3.7  --   --   --   --   --   --    GFR: Estimated Creatinine Clearance: 19.9 mL/min (A) (by C-G formula based on SCr of 1.53 mg/dL (H)). Liver Function Tests: Recent Labs  Lab 05/22/18 1750 05/23/18 0859  AST 37 34  ALT 26 22  ALKPHOS 81 76  BILITOT 0.8 0.9  PROT 6.8 6.8  ALBUMIN 2.7* 2.7*   No results for input(s):  LIPASE, AMYLASE in the last 168 hours. No results for input(s): AMMONIA in the last 168 hours. Coagulation Profile: No results for input(s): INR, PROTIME in the last 168 hours. Cardiac Enzymes: Recent Labs  Lab 05/22/18 2124 05/23/18 0255 05/23/18 0859  TROPONINI 0.05* 0.05* 0.05*   BNP (last 3 results) No results for input(s): PROBNP in the last 8760 hours. HbA1C: No results for input(s): HGBA1C in the last 72 hours. CBG: No results for input(s): GLUCAP in the last 168 hours. Lipid Profile: No results for input(s): CHOL, HDL, LDLCALC, TRIG, CHOLHDL, LDLDIRECT in the last 72 hours. Thyroid Function Tests: No results for input(s): TSH, T4TOTAL, FREET4, T3FREE, THYROIDAB in the last 72 hours. Anemia Panel: No results for input(s): VITAMINB12, FOLATE, FERRITIN, TIBC, IRON, RETICCTPCT in the last 72 hours.    Radiology Studies: I have reviewed all of the imaging during this  hospital visit personally     Scheduled Meds: . aspirin  81 mg Oral Daily  . atorvastatin  40 mg Oral Q2000  . carvedilol  12.5 mg Oral BID WC  . enoxaparin (LOVENOX) injection  30 mg Subcutaneous Q24H  . furosemide  80 mg Intravenous TID PC  . mouth rinse  15 mL Mouth Rinse BID  . mirtazapine  15 mg Oral QHS  . pantoprazole  40 mg Oral BID  . sodium chloride flush  3 mL Intravenous Q12H  . spironolactone  25 mg Oral BID   Continuous Infusions: . sodium chloride    . sodium chloride Stopped (05/24/18 1803)     LOS: 6 days        Nyree Applegate Annett Gula, MD

## 2018-05-28 NOTE — Progress Notes (Signed)
Per CCMD patient sinus tach in the 140's nonsustained. Patient asymptomatic, currently pt is reading her book sitting in her chair.  Arrien MD paged.

## 2018-05-28 NOTE — Progress Notes (Signed)
CM following patient for progression of care; patient resides in a Nursing Facility Long Term; [name] [phone]

## 2018-05-29 DIAGNOSIS — M2550 Pain in unspecified joint: Secondary | ICD-10-CM | POA: Diagnosis not present

## 2018-05-29 DIAGNOSIS — N183 Chronic kidney disease, stage 3 (moderate): Secondary | ICD-10-CM | POA: Diagnosis not present

## 2018-05-29 DIAGNOSIS — E43 Unspecified severe protein-calorie malnutrition: Secondary | ICD-10-CM

## 2018-05-29 DIAGNOSIS — I5043 Acute on chronic combined systolic (congestive) and diastolic (congestive) heart failure: Secondary | ICD-10-CM | POA: Diagnosis not present

## 2018-05-29 DIAGNOSIS — R0902 Hypoxemia: Secondary | ICD-10-CM | POA: Diagnosis not present

## 2018-05-29 DIAGNOSIS — L89899 Pressure ulcer of other site, unspecified stage: Secondary | ICD-10-CM | POA: Diagnosis not present

## 2018-05-29 DIAGNOSIS — S22088D Other fracture of T11-T12 vertebra, subsequent encounter for fracture with routine healing: Secondary | ICD-10-CM | POA: Diagnosis not present

## 2018-05-29 DIAGNOSIS — I5023 Acute on chronic systolic (congestive) heart failure: Secondary | ICD-10-CM | POA: Diagnosis not present

## 2018-05-29 DIAGNOSIS — S22080A Wedge compression fracture of T11-T12 vertebra, initial encounter for closed fracture: Secondary | ICD-10-CM | POA: Diagnosis not present

## 2018-05-29 DIAGNOSIS — M6281 Muscle weakness (generalized): Secondary | ICD-10-CM | POA: Diagnosis not present

## 2018-05-29 DIAGNOSIS — R41841 Cognitive communication deficit: Secondary | ICD-10-CM | POA: Diagnosis not present

## 2018-05-29 DIAGNOSIS — I480 Paroxysmal atrial fibrillation: Secondary | ICD-10-CM | POA: Diagnosis not present

## 2018-05-29 DIAGNOSIS — I10 Essential (primary) hypertension: Secondary | ICD-10-CM | POA: Diagnosis not present

## 2018-05-29 DIAGNOSIS — E785 Hyperlipidemia, unspecified: Secondary | ICD-10-CM | POA: Diagnosis not present

## 2018-05-29 DIAGNOSIS — I25708 Atherosclerosis of coronary artery bypass graft(s), unspecified, with other forms of angina pectoris: Secondary | ICD-10-CM | POA: Diagnosis not present

## 2018-05-29 DIAGNOSIS — R2689 Other abnormalities of gait and mobility: Secondary | ICD-10-CM | POA: Diagnosis not present

## 2018-05-29 LAB — BASIC METABOLIC PANEL
Anion gap: 10 (ref 5–15)
BUN: 32 mg/dL — ABNORMAL HIGH (ref 8–23)
CO2: 43 mmol/L — AB (ref 22–32)
Calcium: 9.8 mg/dL (ref 8.9–10.3)
Chloride: 86 mmol/L — ABNORMAL LOW (ref 98–111)
Creatinine, Ser: 1.44 mg/dL — ABNORMAL HIGH (ref 0.44–1.00)
GFR calc non Af Amer: 33 mL/min — ABNORMAL LOW (ref >60)
GFR, EST AFRICAN AMERICAN: 38 mL/min — AB (ref >60)
Glucose, Bld: 101 mg/dL — ABNORMAL HIGH (ref 70–99)
Potassium: 4 mmol/L (ref 3.5–5.1)
Sodium: 139 mmol/L (ref 135–145)

## 2018-05-29 LAB — MAGNESIUM: Magnesium: 1.7 mg/dL (ref 1.7–2.4)

## 2018-05-29 MED ORDER — ENSURE ENLIVE PO LIQD: 237.0000 mL | Freq: Two times a day (BID) | ORAL | 12 refills | Status: AC

## 2018-05-29 MED ORDER — SPIRONOLACTONE 25 MG PO TABS: 25.0000 mg | ORAL_TABLET | Freq: Two times a day (BID) | ORAL | 0 refills | Status: AC

## 2018-05-29 MED ORDER — CARVEDILOL 25 MG PO TABS: 25.0000 mg | ORAL_TABLET | Freq: Two times a day (BID) | ORAL | 0 refills | Status: AC

## 2018-05-29 MED ORDER — TORSEMIDE 20 MG PO TABS: 60.0000 mg | ORAL_TABLET | Freq: Two times a day (BID) | ORAL | Status: DC

## 2018-05-29 MED ORDER — ENSURE ENLIVE PO LIQD
237.0000 mL | Freq: Two times a day (BID) | ORAL | Status: DC
  Administered 2018-05-29: 237 mL via ORAL

## 2018-05-29 MED ORDER — TORSEMIDE 20 MG PO TABS: 60.0000 mg | ORAL_TABLET | Freq: Two times a day (BID) | ORAL | 0 refills | Status: AC

## 2018-05-29 MED ORDER — MAGNESIUM SULFATE 2 GM/50ML IV SOLN
2.0000 g | Freq: Once | INTRAVENOUS | Status: AC
  Administered 2018-05-29: 2 g via INTRAVENOUS
  Filled 2018-05-29: qty 50

## 2018-05-29 MED ORDER — ADULT MULTIVITAMIN W/MINERALS CH
1.0000 | ORAL_TABLET | Freq: Every day | ORAL | Status: DC
  Administered 2018-05-29: 1 via ORAL
  Filled 2018-05-29: qty 1

## 2018-05-29 MED ORDER — ADULT MULTIVITAMIN W/MINERALS CH: 1.0000 | ORAL_TABLET | Freq: Every day | ORAL | 0 refills | Status: AC

## 2018-05-29 MED ORDER — ASPIRIN EC 81 MG PO TBEC: 81.0000 mg | DELAYED_RELEASE_TABLET | Freq: Every day | ORAL | 2 refills | Status: AC

## 2018-05-29 NOTE — Progress Notes (Signed)
Occupational Therapy Treatment Patient Details Name: [name] MRN: [id number] DOB: 27-Oct-1929 Today's Date: 05/29/2018    History of present illness Pt is an 85 y.o. female admitted from SNF on 05/22/18 with with hypoxia and BEL swelling; worked up for CHF exacerbation. Imaging noted moderate T11 compression fx; likely resulted from previous falls. PMH includes CHF, PVD, CAD, HTN, CKD3.    OT comments  Pt progressing towards OT goals this session. Able to complete bed mobility at min guard level, transfers at min A, own peri care in standing with assist for balance, and 3 consecutive grooming tasks at sink. Current POC remains appropriate and essential for safety and independence   Follow Up Recommendations  SNF;Supervision/Assistance - 24 hour    Equipment Recommendations  Other (comment)(defer to next venue)    Recommendations for Other Services      Precautions / Restrictions Precautions Precautions: Fall;Other (comment) Precaution Comments: Watch SpO2; urine incontinence with mobility Restrictions Weight Bearing Restrictions: No       Mobility Bed Mobility Overal bed mobility: Needs Assistance Bed Mobility: Supine to Sit     Supine to sit: Min guard     General bed mobility comments: increased time and effort  Transfers Overall transfer level: Needs assistance Equipment used: Rolling walker (2 wheeled) Transfers: Sit to/from Stand Sit to Stand: Min assist Stand pivot transfers: Min assist       General transfer comment: Performed multiple sit<>stands from recliner and BSC with min guard to minA for trunk elevation and balance; cues for hand placement on RW. Pt with good eccentric control into sitting    Balance Overall balance assessment: Needs assistance   Sitting balance-Leahy Scale: Fair     Standing balance support: Single extremity supported Standing balance-Leahy Scale: Fair Standing balance comment: reliant on UE support for dynamic  balance                           ADL either performed or assessed with clinical judgement   ADL Overall ADL's : Needs assistance/impaired     Grooming: Wash/dry hands;Wash/dry face;Brushing hair;Min guard;Standing Grooming Details (indicate cue type and reason): sink level             Lower Body Dressing: Moderate assistance;Sit to/from stand Lower Body Dressing Details (indicate cue type and reason): to manage brief Toilet Transfer: Minimal assistance;Stand-pivot;RW   Toileting- Clothing Manipulation and Hygiene: Minimal assistance;Sit to/from stand Toileting - Clothing Manipulation Details (indicate cue type and reason): assist to power up     Functional mobility during ADLs: Minimal assistance;Rolling walker General ADL Comments: pt with weakness and decreased activity tolerance     Vision       Perception     Praxis      Cognition Arousal/Alertness: Awake/alert Behavior During Therapy: WFL for tasks assessed/performed Overall Cognitive Status: No family/caregiver present to determine baseline cognitive functioning                                 General Comments: WFL for simple tasks; tangential with speech requiring frequent redirection. Difficult to truly assess as pt very [facility]        Exercises     Shoulder Instructions       General Comments      Pertinent Vitals/ Pain       Pain Assessment: Faces Faces Pain Scale: Hurts a little bit Pain Location: Perineal  area with pericare Pain Descriptors / Indicators: Sore;Grimacing Pain Intervention(s): Monitored during session;Repositioned  Home Living                                          Prior Functioning/Environment              Frequency  Min 2X/week        Progress Toward Goals  OT Goals(current goals can now be found in the care plan section)  Progress towards OT goals: Progressing toward goals  Acute Rehab OT Goals Patient Stated Goal:  to go back to rehab OT Goal Formulation: With patient Time For Goal Achievement: 06/08/18 Potential to Achieve Goals: Good  Plan Discharge plan remains appropriate;Frequency remains appropriate    Co-evaluation                 AM-PAC OT "6 Clicks" Daily Activity     Outcome Measure   Help from another person eating meals?: A Little Help from another person taking care of personal grooming?: A Little Help from another person toileting, which includes using toliet, bedpan, or urinal?: A Little Help from another person bathing (including washing, rinsing, drying)?: A Little Help from another person to put on and taking off regular upper body clothing?: A Little Help from another person to put on and taking off regular lower body clothing?: A Little 6 Click Score: 18    End of Session    OT Visit Diagnosis: Unsteadiness on feet (R26.81);Muscle weakness (generalized) (M62.81)   Activity Tolerance     Patient Left     Nurse Communication          Time: 3383-2919 OT Time Calculation (min): 16 min  Charges: OT General Charges $OT Visit: 1 Visit OT Treatments $Self Care/Home Management : 8-22 mins  Sherryl Manges OTR/L Acute Rehabilitation Services Pager: [phone] Office: [phone]   [name] 05/29/2018, 12:59 PM

## 2018-05-29 NOTE — Progress Notes (Signed)
Called report to [facility] and spoke with Robby Sermon RN.  Peripheral IV removed, clean dry and intact, dressing and pressure applied.  Patient is currently dressed in her room with nasal canula in 3 L. Belongings in a patients bag: glasses, socks, book, and purse, currently at bedside.  AVS printed and in discharge packet with DNR yellow sheet in packet as well.  awaiting on PTAR to arrive.

## 2018-05-29 NOTE — Care Management Important Message (Signed)
Important Message  Patient Details  Name: [name] MRN: [id number] Date of Birth: [birth year]   Medicare Important Message Given:  Yes    [name] [name] 05/29/2018, 2:54 PM

## 2018-05-29 NOTE — Progress Notes (Signed)
Physical Therapy Treatment Patient Details Name: [name] MRN: [id number] DOB: [birth year] Today's Date: 05/29/2018    History of Present Illness Pt is an 85 y.o. female admitted from SNF on 05/22/18 with with hypoxia and BEL swelling; worked up for CHF exacerbation. Imaging noted moderate T11 compression fx; likely resulted from previous falls. PMH includes CHF, PVD, CAD, HTN, CKD3.    PT Comments    Pt progressing with mobility. Able to increased ambulation distance with RW and close min guard for balance. Pt remains limited by generalized weakness and decreased activity tolerance. Requires prolonged seated rest to recover from short ambulation distance. Plan to return to SNF today.   Follow Up Recommendations  SNF;Supervision for mobility/OOB     Equipment Recommendations  None recommended by PT    Recommendations for Other Services       Precautions / Restrictions Precautions Precautions: Fall;Other (comment) Precaution Comments: Watch SpO2; urine incontinence with mobility Restrictions Weight Bearing Restrictions: No    Mobility  Bed Mobility Overal bed mobility: Needs Assistance Bed Mobility: Supine to Sit     Supine to sit: Min guard     General bed mobility comments: Received standing after using BSC with OT  Transfers Overall transfer level: Needs assistance Equipment used: Rolling walker (2 wheeled) Transfers: Sit to/from Stand Sit to Stand: Min assist Stand pivot transfers: Min assist       General transfer comment: MinA to assist trunk elevation and maintain balance upon standing. Pt with good hand placement and eccentric control into sitting without cues  Ambulation/Gait Ambulation/Gait assistance: Min guard Gait Distance (Feet): 120 Feet Assistive device: Rolling walker (2 wheeled) Gait Pattern/deviations: Step-to pattern;Trunk flexed Gait velocity: Decreased Gait velocity interpretation: <1.8 ft/sec, indicate of risk for recurrent  falls General Gait Details: Slow, mildly unsteady gait with RW and close min guard for balance. Depends donned to alleviate urine incontinence. SpO2 down to 86% on 2L O2 [place]   Stairs             Wheelchair Mobility    Modified Rankin (Stroke Patients Only)       Balance Overall balance assessment: Needs assistance   Sitting balance-Leahy Scale: Fair     Standing balance support: Single extremity supported Standing balance-Leahy Scale: Poor Standing balance comment: reliant on UE support for dynamic balance                            Cognition Arousal/Alertness: Awake/alert Behavior During Therapy: WFL for tasks assessed/performed Overall Cognitive Status: No family/caregiver present to determine baseline cognitive functioning                                 General Comments: WFL for simple tasks; tangential with speech requiring frequent redirection. Difficult to truly assess as pt very [facility]      Exercises      General Comments        Pertinent Vitals/Pain Pain Assessment: Faces Faces Pain Scale: Hurts a little bit Pain Location: Legs Pain Descriptors / Indicators: Tightness Pain Intervention(s): Monitored during session    Home Living                      Prior Function            PT Goals (current goals can now be found in the care plan section) Acute Rehab PT Goals  Patient Stated Goal: to go back to rehab PT Goal Formulation: With patient Time For Goal Achievement: 06/06/18 Potential to Achieve Goals: Good Progress towards PT goals: Progressing toward goals    Frequency    Min 2X/week      PT Plan Current plan remains appropriate    Co-evaluation              AM-PAC PT "6 Clicks" Mobility   Outcome Measure  Help needed turning from your back to your side while in a flat bed without using bedrails?: A Little Help needed moving from lying on your back to sitting on the side of a flat bed without  using bedrails?: A Little Help needed moving to and from a bed to a chair (including a wheelchair)?: A Little Help needed standing up from a chair using your arms (e.g., wheelchair or bedside chair)?: A Little Help needed to walk in hospital room?: A Little Help needed climbing 3-5 steps with a railing? : A Lot 6 Click Score: 17    End of Session Equipment Utilized During Treatment: Gait belt;Oxygen Activity Tolerance: Patient tolerated treatment well Patient left: in chair;with call bell/phone within reach;with chair alarm set Nurse Communication: Mobility status PT Visit Diagnosis: Unsteadiness on feet (R26.81);Muscle weakness (generalized) (M62.81);Difficulty in walking, not elsewhere classified (R26.2)     Time: 4128-7867 PT Time Calculation (min) (ACUTE ONLY): 17 min  Charges:  $Gait Training: 8-22 mins                    Ina Homes, PT, DPT Acute Rehabilitation Services  Pager [phone] Office [phone]  Malachy Chamber 05/29/2018, 2:34 PM

## 2018-05-29 NOTE — Discharge Summary (Addendum)
Physician Discharge Summary  [name] QTM:[id number] DOB: October 28, 1929 DOA: 05/22/2018  PCP: Kirstie Peri, MD  Admit date: 05/22/2018 Discharge date: 05/29/2018  Admitted From: SNF  Disposition:  SNF   Recommendations for Outpatient Follow-up and new medication changes:  1. Follow up with Dr. Sherryll Burger in 7 days.  2. Diuretic titration based on daily weight, discharge weight is 54.6 Kg/ take extra dose of torsemide if weight gain 3 lbs in 24 hours or 5 lbs in 7 days.   3. Furosemide has been changed to torsemide 60 mg bid.  4. Follow on renal panel in 7 days.  5. Coreg has been increased to 25 mg daily. 6. Resumed aspirin therapy.   Home Health: NA  Equipment/Devices: NA   Discharge Condition: stable  CODE STATUS: DNR  Diet recommendation: heart healthy with sodium (less than 2 grams) and fluid restriction (less than 1200 ml per day)   Brief/Interim Summary: 85 year old female who presented from the skilled nursing facility with lower extremity edema. She does have history of coronary artery disease status post CABG, heart failure,chronic kidney disease stage III, hypertension, peripheral vascular disease and remote history of PE. Reported progressive weight gain about 26 pounds over the last 6 months, worsening lower extremity edema and increased oxygen requirements. She was sent to the hospital for further evaluation. On her initial physical examination her blood pressure was 133/89, pulse rate 69, temperature 97.8, respiratory rate 25, oxygen saturation 100% on supplemental oxygen.Her lungs were clear to auscultation bilaterally, heart S1-S2 present rhythmic, abdomen was soft nontender, +4+ bilateral lower extremity edema.  Sodium 140, potassium 4.0, chloride 94, bicarb 33, glucose 115, BUN 18, creatinine 1.45, BNP 1681, troponin 0.05, white count 4.8, hemoglobin 10.3, hematocrit 34.1, platelets 141.  Her chest x-ray was rotated to the left, did show cardiomegaly, small bilateral  pleural effusions, bilateral interstitial infiltrates with vascular congestion, severe kyphosis, and moderate lower thoracic compression fracture approximate T11 new since CT scan from 02/04/2018.  Her EKG had 100 bpm, normal axis, sinus rhythm with right bundle branch block, premature atrial complexes, V2 and V3 ST depressions with T wave inversions (chronic changes)  Patient was admitted to the hospital working diagnosis acute on chronic systolic heart failure decompensation.  1.  Acute on chronic systolic heart failure decompensation.  Left ventricle ejection fraction 15 to 20% (ischemic cardiomyopathy), biventricular failure with significantly reduced right ventricular ejection fraction and pulmonary hypertension.  Patient was admitted to the telemetry unit, she was aggressively diuresed with IV furosemide, negative fluid balance was achieved (- 8,127ml since admission, 7 kg weight loss), with significant improvement of her symptoms.  Echocardiogram showed left ventricle ejection fraction 15 to 20%, normal cavity size.  Mildly increased left ventricular wall thickness.  Akinesis of the peri-apical and mid to distal anteroseptal myocardium with otherwise diffuse hypokinesis.  The right ventricle had severely reduced systolic function, with severely elevated right ventricle systolic pressure, 65 mmHg.  Patient will continue heart failure management with carvedilol, which dose has been increased to 25 mg twice daily with good toleration.  Continue spironolactone and diuresis with torsemide.  Holding on ACE inhibitors or ARB due to acute kidney injury.  She has still pitting edema in her thighs, dependent areas, likely due to hypoproteinemia and decreased oncotic pressure.   Old records personally reviewed, apparently she declined AICD.  She developed cough with lisinopril and angiotensin receptor blockers.  2.  Acute kidney injury on chronic kidney disease stage III with hypokalemia and hypomagnesemia.  Patient tolerated well diuresis with IV furosemide, her discharge creatinine is 1.44, potassium 4.0, magnesium 1.7 and serum bicarbonate 43.  Peak creatinine reached 1.53.  Patient will continue diuresis with torsemide, and spironolactone.  She will need close follow-up of kidney function and electrolytes.  Will hold on potassium supplements for now since spironolactone has been added. Follow on renal panel in 7 days.   3.  Paroxysmal atrial fibrillation (post myocardial infarction in the past).  Patient has remained on sinus rhythm, she had one episode of brief supraventricular tachycardia.  Carvedilol has been increased to 25 mg twice daily with good toleration.  Currently off anticoagulation.  4.  Hypertension.  Continue blood pressure control with carvedilol and spironolactone, no ACE inhibitors or ARB due to risk of worsening kidney injury.  5.  Coronary artery disease.  Patient remained chest pain-free, continue antiplatelet therapy and atorvastatin. Hb has remained stable at 10.3.   6.  Dyslipidemia.  Continue atorvastatin.  7.  Bilateral feet, pressure ulcer, unstageable, present on admission.  Continue local wound care.  8. Protein calorie malnutrition. Nutritional services consulted, to optimize nutrition, part of her dependent edema, may be related to decreased oncotic pressure.   Discharge Diagnoses:  Principal Problem:   Acute on chronic combined systolic and diastolic CHF (congestive heart failure) (HCC) Active Problems:   Dyslipidemia   HTN (hypertension)   Paroxysmal atrial fibrillation (HCC)   Coronary artery disease   CKD (chronic kidney disease), stage III (HCC)   Compression fracture of T11 vertebra (HCC)   Pressure injury of skin    Discharge Instructions   Allergies as of 05/29/2018      Reactions   Lisinopril Cough   Ace Inhibitors Other (See Comments)   "Allergic," per [facility]   Eliquis [apixaban] Other (See Comments)   "Allergic," per [facility]   Lipitor  [atorvastatin] Other (See Comments)   "Allergic," per [facility]   Sulfonamide Derivatives Rash, Other (See Comments)   Severe Headache, also AND "Allergic," per [facility]      Medication List    STOP taking these medications   cefUROXime 250 MG tablet Commonly known as:  CEFTIN   furosemide 80 MG tablet Commonly known as:  LASIX   potassium chloride 10 MEQ tablet Commonly known as:  K-DUR,KLOR-CON     TAKE these medications   aspirin EC 81 MG tablet Take 1 tablet (81 mg total) by mouth daily.   atorvastatin 40 MG tablet Commonly known as:  LIPITOR Take 40 mg by mouth daily.   carvedilol 25 MG tablet Commonly known as:  COREG Take 1 tablet (25 mg total) by mouth 2 (two) times daily with a meal for 30 days. What changed:    medication strength  how much to take   feeding supplement (ENSURE ENLIVE) Liqd Take 237 mLs by mouth 2 (two) times daily between meals. Start taking on:  May 30, 2018   mirtazapine 15 MG tablet Commonly known as:  REMERON Take 15 mg by mouth at bedtime.   multivitamin with minerals Tabs tablet Take 1 tablet by mouth daily for 30 days. Start taking on:  May 30, 2018   nitroGLYCERIN 0.4 MG SL tablet Commonly known as:  NITROSTAT Place 0.4 mg under the tongue every 5 (five) minutes as needed for chest pain.   pantoprazole 40 MG tablet Commonly known as:  PROTONIX Take 40 mg by mouth 2 (two) times daily.   spironolactone 25 MG tablet Commonly known as:  ALDACTONE Take 1 tablet (25  mg total) by mouth 2 (two) times daily for 30 days.   torsemide 20 MG tablet Commonly known as:  DEMADEX Take 3 tablets (60 mg total) by mouth 2 (two) times daily for 30 days.      Contact information for after-discharge care    Destination    [facility] Preferred SNF .   Service:  Skilled Nursing Contact information: [address] [place] [address] [phone]             Allergies   Allergen Reactions  . Lisinopril Cough  . Ace Inhibitors Other (See Comments)    "Allergic," per [facility]  . Eliquis [Apixaban] Other (See Comments)    "Allergic," per [facility]  . Lipitor [Atorvastatin] Other (See Comments)    "Allergic," per [facility]  . Sulfonamide Derivatives Rash and Other (See Comments)    Severe Headache, also AND "Allergic," per [facility]    Consultations:     Procedures/Studies: Dg Chest 2 View  Result Date: 05/22/2018 CLINICAL DATA:  Leg edema EXAM: CHEST - 2 VIEW COMPARISON:  05/07/2018, 03/14/2018, CT 02/03/2018 FINDINGS: Small moderate bilateral pleural effusions, slightly increased on the right side. Stable cardiomegaly with vascular congestion and interstitial pulmonary edema. Worsened basilar airspace disease. Post sternotomy changes. Moderate compression fracture with mild sclerosis of a lower thoracic vertebra. IMPRESSION: 1. Small moderate bilateral pleural effusions, increased on the right side. Cardiomegaly with vascular congestion and mild pulmonary edema 2. Bilateral lung base atelectasis or pneumonia. 3. Moderate lower thoracic compression fracture, approximate T11, new since CT 02/04/2018 Electronically Signed   By: Jasmine Pang M.D.   On: 05/22/2018 18:40       Subjective: Patient is feeling better, no dyspnea or chest pain, continue to improve lower extremity edema, no nausea or vomiting.   Discharge Exam: Vitals:   05/28/18 2038 05/29/18 0552  BP: 130/63 (!) 117/51  Pulse: 70 64  Resp: 18 19  Temp: 97.8 F (36.6 C) 97.7 F (36.5 C)  SpO2: 100% 100%   Vitals:   05/28/18 1639 05/28/18 2038 05/29/18 0327 05/29/18 0552  BP: 121/60 130/63  (!) 117/51  Pulse:  70  64  Resp:  18  19  Temp:  97.8 F (36.6 C)  97.7 F (36.5 C)  TempSrc:  Oral  Oral  SpO2:  100%  100%  Weight:   54.6 kg   Height:        General: Not in pain or dyspnea.  Neurology: Awake and alert, non focal  E ENT: mild pallor, no icterus, oral mucosa moist Cardiovascular: No  JVD. S1-S2 present, rhythmic, no gallops, rubs, or murmurs. Positive lower extremity edema ++ at the thighs dependent zones and trace distal legs. Pulmonary: positive breath sounds bilaterally, no wheezing, rhonchi or rales. Gastrointestinal. Abdomen with, no organomegaly, non tender, no rebound or guarding Skin. No rashes Musculoskeletal: no joint deformities   The results of significant diagnostics from this hospitalization (including imaging, microbiology, ancillary and laboratory) are listed below for reference.     Microbiology: Recent Results (from the past 240 hour(s))  MRSA PCR Screening     Status: None   Collection Time: 05/26/18 10:49 AM  Result Value Ref Range Status   MRSA by PCR NEGATIVE NEGATIVE Final    Comment:        The GeneXpert MRSA Assay (FDA approved for NASAL specimens only), is one component of a comprehensive MRSA colonization surveillance program. It is not intended to diagnose MRSA  infection nor to guide or monitor treatment for MRSA infections. Performed at [facility], [address] [place], [place] [address]      Labs: BNP (last 3 results) Recent Labs    05/22/18 1750  BNP 1,681.8*   Basic Metabolic Panel: Recent Labs  Lab 05/23/18 0859  05/25/18 0537 05/26/18 0445 05/27/18 0457 05/27/18 1343 05/28/18 0544 05/29/18 0447  NA 138   < > 136 137  --  135 138 139  K 3.9   < > 3.5 3.6  --  3.6 3.9 4.0  CL 95*   < > 93* 90*  --  87* 88* 86*  CO2 32   < > 35* 35*  --  34* 39* 43*  GLUCOSE 134*   < > 115* 107*  --  128* 105* 101*  BUN 18   < > 22 27*  --  31* 30* 32*  CREATININE 1.40*   < > 1.37* 1.40*  --  1.45* 1.53* 1.44*  CALCIUM 8.4*   < > 8.3* 9.1  --  9.4 9.6 9.8  MG 0.7*   < > 1.9 1.7 1.4*  --  1.3* 1.7  PHOS 3.7  --   --   --   --   --   --   --    < > = values in this interval not displayed.   Liver Function Tests: Recent Labs  Lab 05/22/18 1750 05/23/18 0859  AST 37 34  ALT 26 22  ALKPHOS 81 76  BILITOT 0.8  0.9  PROT 6.8 6.8  ALBUMIN 2.7* 2.7*   No results for input(s): LIPASE, AMYLASE in the last 168 hours. No results for input(s): AMMONIA in the last 168 hours. CBC: Recent Labs  Lab 05/22/18 1750 05/23/18 0859  WBC 4.8 5.4  NEUTROABS 3.3  --   HGB 10.3* 10.3*  HCT 34.1* 33.4*  MCV 92.2 90.5  PLT 141* 141*   Cardiac Enzymes: Recent Labs  Lab 05/22/18 2124 05/23/18 0255 05/23/18 0859  TROPONINI 0.05* 0.05* 0.05*   BNP: Invalid input(s): POCBNP CBG: No results for input(s): GLUCAP in the last 168 hours. D-Dimer No results for input(s): DDIMER in the last 72 hours. Hgb A1c No results for input(s): HGBA1C in the last 72 hours. Lipid Profile No results for input(s): CHOL, HDL, LDLCALC, TRIG, CHOLHDL, LDLDIRECT in the last 72 hours. Thyroid function studies No results for input(s): TSH, T4TOTAL, T3FREE, THYROIDAB in the last 72 hours.  Invalid input(s): FREET3 Anemia work up No results for input(s): VITAMINB12, FOLATE, FERRITIN, TIBC, IRON, RETICCTPCT in the last 72 hours. Urinalysis    Component Value Date/Time   COLORURINE YELLOW 12/12/2011 1409   APPEARANCEUR CLEAR 12/12/2011 1409   LABSPEC 1.007 12/12/2011 1409   PHURINE 7.0 12/12/2011 1409   GLUCOSEU NEGATIVE 12/12/2011 1409   HGBUR NEGATIVE 12/12/2011 1409   BILIRUBINUR NEGATIVE 12/12/2011 1409   KETONESUR NEGATIVE 12/12/2011 1409   PROTEINUR NEGATIVE 12/12/2011 1409   UROBILINOGEN 0.2 12/12/2011 1409   NITRITE NEGATIVE 12/12/2011 1409   LEUKOCYTESUR TRACE (A) 12/12/2011 1409   Sepsis Labs Invalid input(s): PROCALCITONIN,  WBC,  LACTICIDVEN Microbiology Recent Results (from the past 240 hour(s))  MRSA PCR Screening     Status: None   Collection Time: 05/26/18 10:49 AM  Result Value Ref Range Status   MRSA by PCR NEGATIVE NEGATIVE Final    Comment:        The GeneXpert MRSA Assay (FDA approved for NASAL specimens only), is one  component of a comprehensive MRSA colonization surveillance program. It  is not intended to diagnose MRSA infection nor to guide or monitor treatment for MRSA infections. Performed at [facility], [address] [place], [place] [address]      Time coordinating discharge: 45 minutes  SIGNED:   Coralie Keens, MD  [facility] 05/29/2018, 9:10 AM

## 2018-05-29 NOTE — Consult Note (Signed)
   [facility] Inpatient Consult   05/29/2018  Lenoria Slattery [facility] October 24, 1929 [id number]  Patient screened for medium risk score and hospitalization to check if potential [facility] Care Management services are needed for Heart failure follow up.  Chart review reveals patient is a long term resident at [facility].  No community follow up needed. For questions contact:    Charlesetta Shanks, RN BSN CCM [facility]  [phone] business mobile phone Toll free office [phone]

## 2018-05-29 NOTE — Progress Notes (Signed)
Initial Nutrition Assessment  DOCUMENTATION CODES:   Severe malnutrition in context of chronic illness  INTERVENTION:   -Ensure Enlive po BID, each supplement provides 350 kcal and 20 grams of protein -MVI with minerals daily  NUTRITION DIAGNOSIS:   Severe Malnutrition related to chronic illness(CHF) as evidenced by energy intake < 75% for > or equal to 1 month, moderate fat depletion, severe fat depletion, moderate muscle depletion, severe muscle depletion, edema.  Ongoing  GOAL:   Patient will meet greater than or equal to 90% of their needs  Progressing  MONITOR:   PO intake, Supplement acceptance, Labs, Weight trends, Skin, I & O's  REASON FOR ASSESSMENT:   Consult Assessment of nutrition requirement/status  ASSESSMENT:   DRUCILLA DIPIETRANTONIO is a 85 y.o. female with medical history significant of coronary artery disease and CABG (May 2012) with chronicbiventricularheart failure, as well as peripheral vascular disease and is status post right carotid endarterectomy, along with hypertension and stage III chronic kidney disease,  hypertension. Present with fluid retention and leg edema.   Pt admitted with acute on chronic combined systolic CHF.   Reviewed I/O's: -918 ml x 24 hours and -8.1 L since admission  Case discussed with RN, who reports that MD is requesting RD consult prior to discharge in order to obtain RD recommendations to optimize nutritional status. Per discussion with CSW, plan to d/c back to [facility].   Reviewed records from SNF; pertinent medications include remeron. Unsure of pt;s diet PTA (pt reports she was not on a restricted diet per her report). No supplement orders noted.   Spoke with pt at bedside, who was very tangential at time of visit. SHe reports that she was recently transferred to SNF from home. Pt reports that she had not been eating well, as she does not like the food at SNF. They recently started provided  her Ensure at SNF, which she will drink, to assist with poor oral intake. She also shares that she has had poor progression with therapy secondary to poor oral intake and fluid overload.   Pt shares intake has improved since hospitalization. She feels better after diuresis. She shares that she has been consuming most of her food since hospitalization; meal completion documented 75-100%. Pt reports that she has dentures, but often does not eat with them and denies any difficulty chewing or swallowing.   Pt reports her UBW is around 160#. Per her reports she has lost about 14# since hospitalization, likely due to diuresis. Reviewed wt hx, which reveals wt has been relatively stable over the past year, however, noted some wt hx related to fluid changes with CHF.   Discussed with pt importance of good meal and supplement intake to promote healing. Encouraged pt to consume supplements, especially when pt is eating poorly. Discussed importance of good nutritional intake to promote healing and aid in therapy progression.   Labs reviewed.   NUTRITION - FOCUSED PHYSICAL EXAM:    Most Recent Value  Orbital Region  Severe depletion  Upper Arm Region  Moderate depletion  Thoracic and Lumbar Region  Severe depletion  Buccal Region  Mild depletion  Temple Region  Severe depletion  Clavicle Bone Region  Severe depletion  Clavicle and Acromion Bone Region  Severe depletion  Scapular Bone Region  Severe depletion  Dorsal Hand  Severe depletion  Patellar Region  Mild depletion  Anterior Thigh Region  Mild depletion  Posterior Calf Region  Mild depletion  Edema (RD Assessment)  Moderate  Hair  Reviewed  Eyes  Reviewed  Mouth  Reviewed  Skin  Reviewed  Nails  Reviewed       Diet Order:   Diet Order            Diet - low sodium heart healthy        Diet 2 gram sodium Room service appropriate? Yes; Fluid consistency: Thin; Fluid restriction: 1200 mL Fluid  Diet effective now               EDUCATION NEEDS:   Education needs have been addressed  Skin:  Skin Assessment: Skin Integrity Issues: Skin Integrity Issues:: Unstageable Unstageable: bilateral mid foot  Last BM:  05/28/18  Height:   Ht Readings from Last 1 Encounters:  05/22/18 5\' 1"  (1.549 m)    Weight:   Wt Readings from Last 1 Encounters:  05/29/18 54.6 kg    Ideal Body Weight:  47.7 kg  BMI:  Body mass index is 22.75 kg/m.  Estimated Nutritional Needs:   Kcal:  1450-1650  Protein:  70-85 grams  Fluid:  1.5-2.7 L    Kiari Hosmer A. Mayford Knife, RD, LDN, CDCES Registered Dietitian II Certified Diabetes Care and Education Specialist Pager: [phone] After hours Pager: [phone]

## 2018-05-29 NOTE — Clinical Social Work Note (Signed)
CSW facilitated patient discharge including contacting patient family Bjorn Loser) and facility to confirm patient discharge plans. Clinical information faxed to facility and family agreeable with plan. CSW arranged ambulance transport via [facility] to [facility] at 1:30 pm. RN to call report prior to discharge [phone]).  CSW will sign off for now as social work intervention is no longer needed. Please consult [place] again if new needs arise.  Charlynn Court, CSW [phone]

## 2018-06-02 ENCOUNTER — Other Ambulatory Visit: Payer: Self-pay | Admitting: *Deleted

## 2018-06-02 NOTE — Patient Outreach (Signed)
[facility]) Care Management  06/02/2018  Crystalrose Washer [facility] 1929-10-31 [id number]   Noted admission to [facility] from inpatient admission. Per Epic notes patient is a LTC resident at facility.  The facility provides any needed CM and SW services for patient.  No [facility] Care management needs noted at this time. Plan to sign off, will collaborate with [facility] if indicated.   Alben Spittle. Albertha Ghee, MSN, RN, [facility], CCM  Post Acute Chartered loss adjuster [phone]) Business Cell  [phone]) Toll Free Office

## 2018-06-09 DIAGNOSIS — R2689 Other abnormalities of gait and mobility: Secondary | ICD-10-CM | POA: Diagnosis not present

## 2018-06-09 DIAGNOSIS — R41841 Cognitive communication deficit: Secondary | ICD-10-CM | POA: Diagnosis not present

## 2018-06-09 DIAGNOSIS — M6281 Muscle weakness (generalized): Secondary | ICD-10-CM | POA: Diagnosis not present

## 2018-06-09 DIAGNOSIS — I5043 Acute on chronic combined systolic (congestive) and diastolic (congestive) heart failure: Secondary | ICD-10-CM | POA: Diagnosis not present

## 2018-06-10 DIAGNOSIS — R2689 Other abnormalities of gait and mobility: Secondary | ICD-10-CM | POA: Diagnosis not present

## 2018-06-10 DIAGNOSIS — M6281 Muscle weakness (generalized): Secondary | ICD-10-CM | POA: Diagnosis not present

## 2018-06-10 DIAGNOSIS — I5043 Acute on chronic combined systolic (congestive) and diastolic (congestive) heart failure: Secondary | ICD-10-CM | POA: Diagnosis not present

## 2018-06-10 DIAGNOSIS — R41841 Cognitive communication deficit: Secondary | ICD-10-CM | POA: Diagnosis not present

## 2018-06-11 DIAGNOSIS — I5043 Acute on chronic combined systolic (congestive) and diastolic (congestive) heart failure: Secondary | ICD-10-CM | POA: Diagnosis not present

## 2018-06-11 DIAGNOSIS — R41841 Cognitive communication deficit: Secondary | ICD-10-CM | POA: Diagnosis not present

## 2018-06-11 DIAGNOSIS — R2689 Other abnormalities of gait and mobility: Secondary | ICD-10-CM | POA: Diagnosis not present

## 2018-06-11 DIAGNOSIS — M6281 Muscle weakness (generalized): Secondary | ICD-10-CM | POA: Diagnosis not present

## 2018-06-12 DIAGNOSIS — R2689 Other abnormalities of gait and mobility: Secondary | ICD-10-CM | POA: Diagnosis not present

## 2018-06-12 DIAGNOSIS — I5043 Acute on chronic combined systolic (congestive) and diastolic (congestive) heart failure: Secondary | ICD-10-CM | POA: Diagnosis not present

## 2018-06-12 DIAGNOSIS — R41841 Cognitive communication deficit: Secondary | ICD-10-CM | POA: Diagnosis not present

## 2018-06-12 DIAGNOSIS — M6281 Muscle weakness (generalized): Secondary | ICD-10-CM | POA: Diagnosis not present

## 2018-06-13 DIAGNOSIS — R41841 Cognitive communication deficit: Secondary | ICD-10-CM | POA: Diagnosis not present

## 2018-06-13 DIAGNOSIS — R2689 Other abnormalities of gait and mobility: Secondary | ICD-10-CM | POA: Diagnosis not present

## 2018-06-13 DIAGNOSIS — I5043 Acute on chronic combined systolic (congestive) and diastolic (congestive) heart failure: Secondary | ICD-10-CM | POA: Diagnosis not present

## 2018-06-13 DIAGNOSIS — M6281 Muscle weakness (generalized): Secondary | ICD-10-CM | POA: Diagnosis not present

## 2018-06-15 DIAGNOSIS — R2689 Other abnormalities of gait and mobility: Secondary | ICD-10-CM | POA: Diagnosis not present

## 2018-06-15 DIAGNOSIS — I5043 Acute on chronic combined systolic (congestive) and diastolic (congestive) heart failure: Secondary | ICD-10-CM | POA: Diagnosis not present

## 2018-06-15 DIAGNOSIS — R41841 Cognitive communication deficit: Secondary | ICD-10-CM | POA: Diagnosis not present

## 2018-06-15 DIAGNOSIS — M6281 Muscle weakness (generalized): Secondary | ICD-10-CM | POA: Diagnosis not present

## 2018-06-16 DIAGNOSIS — R41841 Cognitive communication deficit: Secondary | ICD-10-CM | POA: Diagnosis not present

## 2018-06-16 DIAGNOSIS — I5043 Acute on chronic combined systolic (congestive) and diastolic (congestive) heart failure: Secondary | ICD-10-CM | POA: Diagnosis not present

## 2018-06-16 DIAGNOSIS — R2689 Other abnormalities of gait and mobility: Secondary | ICD-10-CM | POA: Diagnosis not present

## 2018-06-16 DIAGNOSIS — M6281 Muscle weakness (generalized): Secondary | ICD-10-CM | POA: Diagnosis not present

## 2018-06-17 DIAGNOSIS — M6281 Muscle weakness (generalized): Secondary | ICD-10-CM | POA: Diagnosis not present

## 2018-06-17 DIAGNOSIS — R41841 Cognitive communication deficit: Secondary | ICD-10-CM | POA: Diagnosis not present

## 2018-06-17 DIAGNOSIS — I5043 Acute on chronic combined systolic (congestive) and diastolic (congestive) heart failure: Secondary | ICD-10-CM | POA: Diagnosis not present

## 2018-06-18 DIAGNOSIS — M6281 Muscle weakness (generalized): Secondary | ICD-10-CM | POA: Diagnosis not present

## 2018-06-18 DIAGNOSIS — R41841 Cognitive communication deficit: Secondary | ICD-10-CM | POA: Diagnosis not present

## 2018-06-18 DIAGNOSIS — I5043 Acute on chronic combined systolic (congestive) and diastolic (congestive) heart failure: Secondary | ICD-10-CM | POA: Diagnosis not present

## 2018-06-19 DIAGNOSIS — R41841 Cognitive communication deficit: Secondary | ICD-10-CM | POA: Diagnosis not present

## 2018-06-19 DIAGNOSIS — I5043 Acute on chronic combined systolic (congestive) and diastolic (congestive) heart failure: Secondary | ICD-10-CM | POA: Diagnosis not present

## 2018-06-19 DIAGNOSIS — M6281 Muscle weakness (generalized): Secondary | ICD-10-CM | POA: Diagnosis not present

## 2018-06-22 DIAGNOSIS — I5043 Acute on chronic combined systolic (congestive) and diastolic (congestive) heart failure: Secondary | ICD-10-CM | POA: Diagnosis not present

## 2018-06-22 DIAGNOSIS — R41841 Cognitive communication deficit: Secondary | ICD-10-CM | POA: Diagnosis not present

## 2018-06-22 DIAGNOSIS — M6281 Muscle weakness (generalized): Secondary | ICD-10-CM | POA: Diagnosis not present

## 2018-06-23 DIAGNOSIS — I509 Heart failure, unspecified: Secondary | ICD-10-CM | POA: Diagnosis not present

## 2018-06-23 DIAGNOSIS — I10 Essential (primary) hypertension: Secondary | ICD-10-CM | POA: Diagnosis not present

## 2018-06-23 DIAGNOSIS — R41841 Cognitive communication deficit: Secondary | ICD-10-CM | POA: Diagnosis not present

## 2018-06-23 DIAGNOSIS — N183 Chronic kidney disease, stage 3 (moderate): Secondary | ICD-10-CM | POA: Diagnosis not present

## 2018-06-23 DIAGNOSIS — I5043 Acute on chronic combined systolic (congestive) and diastolic (congestive) heart failure: Secondary | ICD-10-CM | POA: Diagnosis not present

## 2018-06-23 DIAGNOSIS — M6281 Muscle weakness (generalized): Secondary | ICD-10-CM | POA: Diagnosis not present

## 2018-06-24 DIAGNOSIS — I5043 Acute on chronic combined systolic (congestive) and diastolic (congestive) heart failure: Secondary | ICD-10-CM | POA: Diagnosis not present

## 2018-06-24 DIAGNOSIS — R41841 Cognitive communication deficit: Secondary | ICD-10-CM | POA: Diagnosis not present

## 2018-06-24 DIAGNOSIS — M6281 Muscle weakness (generalized): Secondary | ICD-10-CM | POA: Diagnosis not present

## 2018-06-25 DIAGNOSIS — R41841 Cognitive communication deficit: Secondary | ICD-10-CM | POA: Diagnosis not present

## 2018-06-25 DIAGNOSIS — M6281 Muscle weakness (generalized): Secondary | ICD-10-CM | POA: Diagnosis not present

## 2018-06-25 DIAGNOSIS — I5043 Acute on chronic combined systolic (congestive) and diastolic (congestive) heart failure: Secondary | ICD-10-CM | POA: Diagnosis not present

## 2018-06-26 DIAGNOSIS — R41841 Cognitive communication deficit: Secondary | ICD-10-CM | POA: Diagnosis not present

## 2018-06-26 DIAGNOSIS — M6281 Muscle weakness (generalized): Secondary | ICD-10-CM | POA: Diagnosis not present

## 2018-06-26 DIAGNOSIS — I5043 Acute on chronic combined systolic (congestive) and diastolic (congestive) heart failure: Secondary | ICD-10-CM | POA: Diagnosis not present

## 2018-06-29 DIAGNOSIS — R41841 Cognitive communication deficit: Secondary | ICD-10-CM | POA: Diagnosis not present

## 2018-06-29 DIAGNOSIS — M6281 Muscle weakness (generalized): Secondary | ICD-10-CM | POA: Diagnosis not present

## 2018-06-29 DIAGNOSIS — I5043 Acute on chronic combined systolic (congestive) and diastolic (congestive) heart failure: Secondary | ICD-10-CM | POA: Diagnosis not present

## 2018-06-30 DIAGNOSIS — I5043 Acute on chronic combined systolic (congestive) and diastolic (congestive) heart failure: Secondary | ICD-10-CM | POA: Diagnosis not present

## 2018-06-30 DIAGNOSIS — M6281 Muscle weakness (generalized): Secondary | ICD-10-CM | POA: Diagnosis not present

## 2018-06-30 DIAGNOSIS — R41841 Cognitive communication deficit: Secondary | ICD-10-CM | POA: Diagnosis not present

## 2018-07-01 DIAGNOSIS — M6281 Muscle weakness (generalized): Secondary | ICD-10-CM | POA: Diagnosis not present

## 2018-07-01 DIAGNOSIS — I5043 Acute on chronic combined systolic (congestive) and diastolic (congestive) heart failure: Secondary | ICD-10-CM | POA: Diagnosis not present

## 2018-07-01 DIAGNOSIS — R41841 Cognitive communication deficit: Secondary | ICD-10-CM | POA: Diagnosis not present

## 2018-07-02 DIAGNOSIS — R41841 Cognitive communication deficit: Secondary | ICD-10-CM | POA: Diagnosis not present

## 2018-07-02 DIAGNOSIS — I5043 Acute on chronic combined systolic (congestive) and diastolic (congestive) heart failure: Secondary | ICD-10-CM | POA: Diagnosis not present

## 2018-07-02 DIAGNOSIS — M6281 Muscle weakness (generalized): Secondary | ICD-10-CM | POA: Diagnosis not present

## 2018-07-03 DIAGNOSIS — I5043 Acute on chronic combined systolic (congestive) and diastolic (congestive) heart failure: Secondary | ICD-10-CM | POA: Diagnosis not present

## 2018-07-03 DIAGNOSIS — R41841 Cognitive communication deficit: Secondary | ICD-10-CM | POA: Diagnosis not present

## 2018-07-03 DIAGNOSIS — M6281 Muscle weakness (generalized): Secondary | ICD-10-CM | POA: Diagnosis not present

## 2018-07-06 DIAGNOSIS — M6281 Muscle weakness (generalized): Secondary | ICD-10-CM | POA: Diagnosis not present

## 2018-07-06 DIAGNOSIS — R41841 Cognitive communication deficit: Secondary | ICD-10-CM | POA: Diagnosis not present

## 2018-07-06 DIAGNOSIS — I5043 Acute on chronic combined systolic (congestive) and diastolic (congestive) heart failure: Secondary | ICD-10-CM | POA: Diagnosis not present

## 2018-07-07 DIAGNOSIS — I509 Heart failure, unspecified: Secondary | ICD-10-CM | POA: Diagnosis not present

## 2018-07-07 DIAGNOSIS — R41841 Cognitive communication deficit: Secondary | ICD-10-CM | POA: Diagnosis not present

## 2018-07-07 DIAGNOSIS — F419 Anxiety disorder, unspecified: Secondary | ICD-10-CM | POA: Diagnosis not present

## 2018-07-07 DIAGNOSIS — M6281 Muscle weakness (generalized): Secondary | ICD-10-CM | POA: Diagnosis not present

## 2018-07-07 DIAGNOSIS — I10 Essential (primary) hypertension: Secondary | ICD-10-CM | POA: Diagnosis not present

## 2018-07-07 DIAGNOSIS — I5043 Acute on chronic combined systolic (congestive) and diastolic (congestive) heart failure: Secondary | ICD-10-CM | POA: Diagnosis not present

## 2018-07-08 DIAGNOSIS — I5043 Acute on chronic combined systolic (congestive) and diastolic (congestive) heart failure: Secondary | ICD-10-CM | POA: Diagnosis not present

## 2018-07-08 DIAGNOSIS — R41841 Cognitive communication deficit: Secondary | ICD-10-CM | POA: Diagnosis not present

## 2018-07-08 DIAGNOSIS — M6281 Muscle weakness (generalized): Secondary | ICD-10-CM | POA: Diagnosis not present

## 2018-07-09 DIAGNOSIS — M6281 Muscle weakness (generalized): Secondary | ICD-10-CM | POA: Diagnosis not present

## 2018-07-09 DIAGNOSIS — R41841 Cognitive communication deficit: Secondary | ICD-10-CM | POA: Diagnosis not present

## 2018-07-09 DIAGNOSIS — I5043 Acute on chronic combined systolic (congestive) and diastolic (congestive) heart failure: Secondary | ICD-10-CM | POA: Diagnosis not present

## 2018-07-10 DIAGNOSIS — R41841 Cognitive communication deficit: Secondary | ICD-10-CM | POA: Diagnosis not present

## 2018-07-10 DIAGNOSIS — M6281 Muscle weakness (generalized): Secondary | ICD-10-CM | POA: Diagnosis not present

## 2018-07-10 DIAGNOSIS — I5043 Acute on chronic combined systolic (congestive) and diastolic (congestive) heart failure: Secondary | ICD-10-CM | POA: Diagnosis not present

## 2018-07-17 DEATH — deceased

## 2021-01-19 IMAGING — DX DG CHEST 2V
2 series · 2 of 2 positions shown · non-contrast
Comparison: 05/07/2018, 03/14/2018, CT 02/03/2018

CLINICAL DATA: Leg edema

EXAM:
CHEST - 2 VIEW

[chest ap]
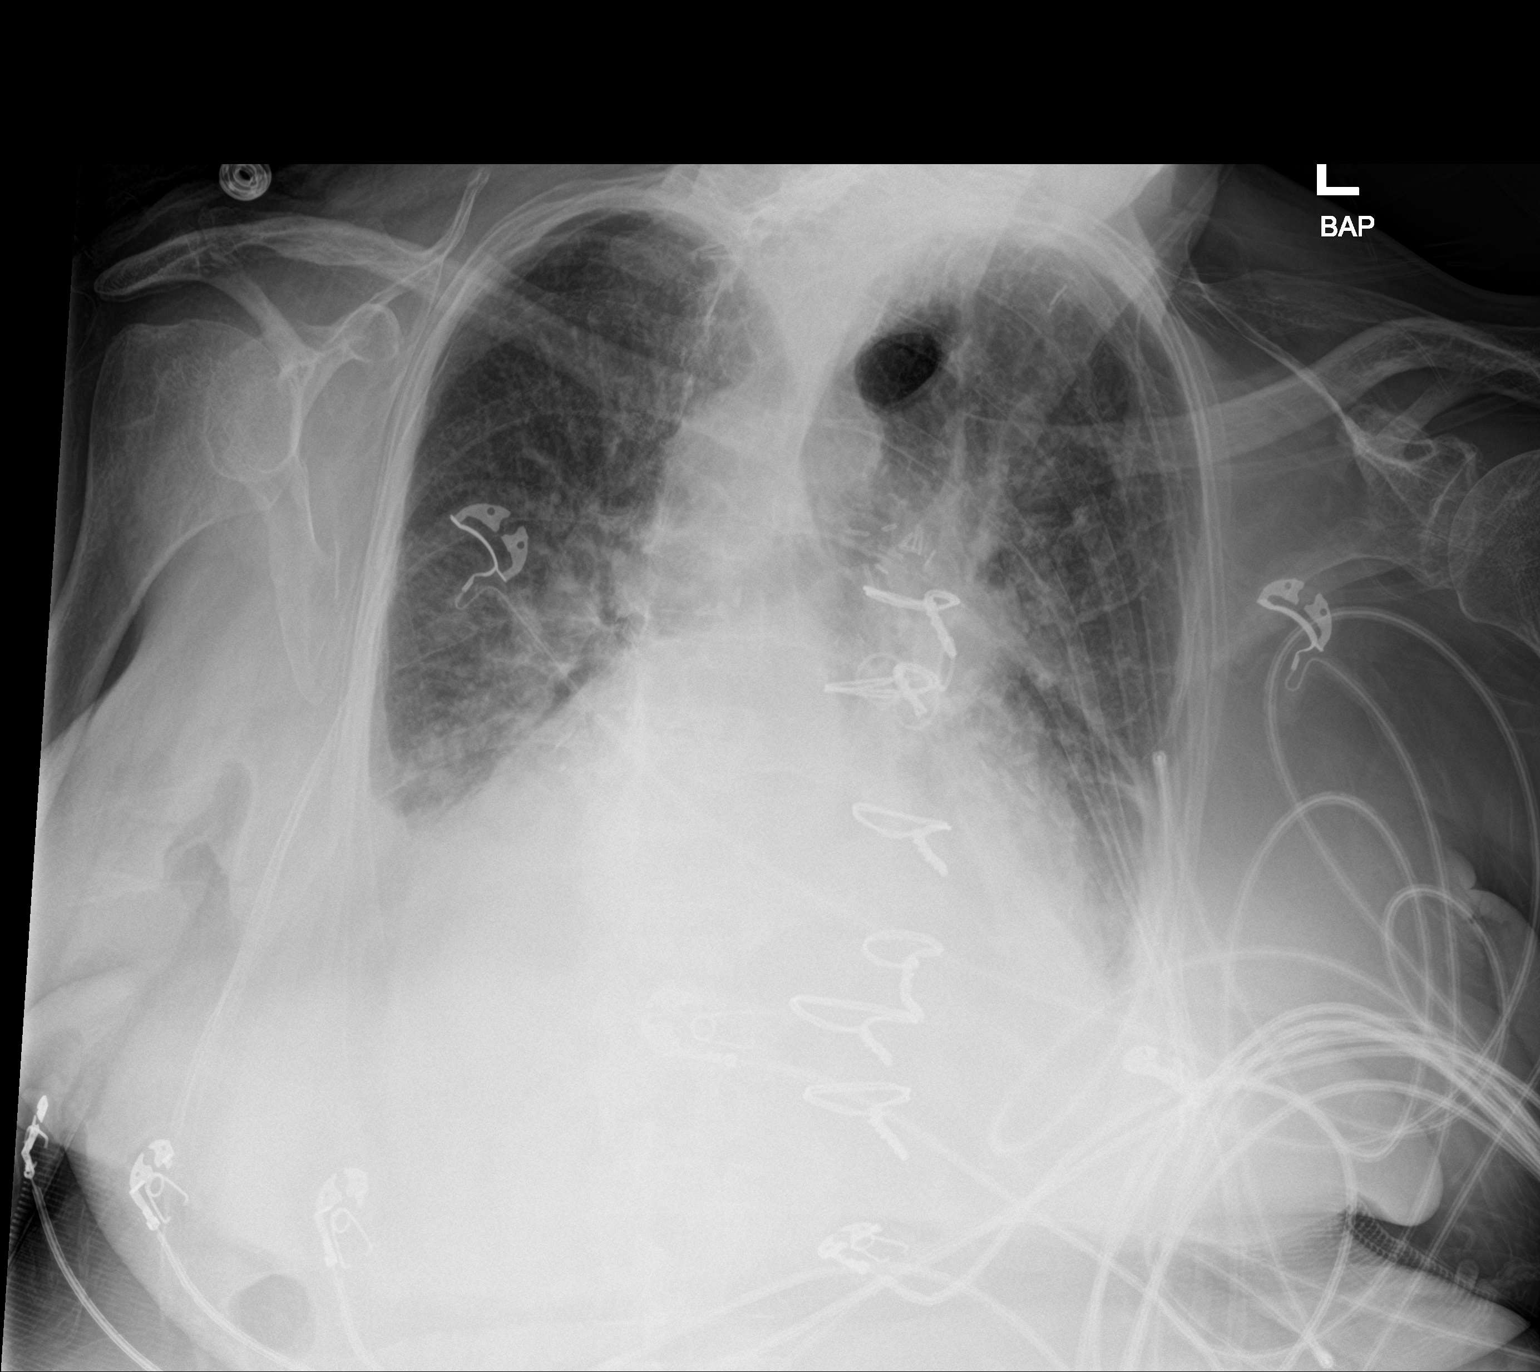

[chest lat]
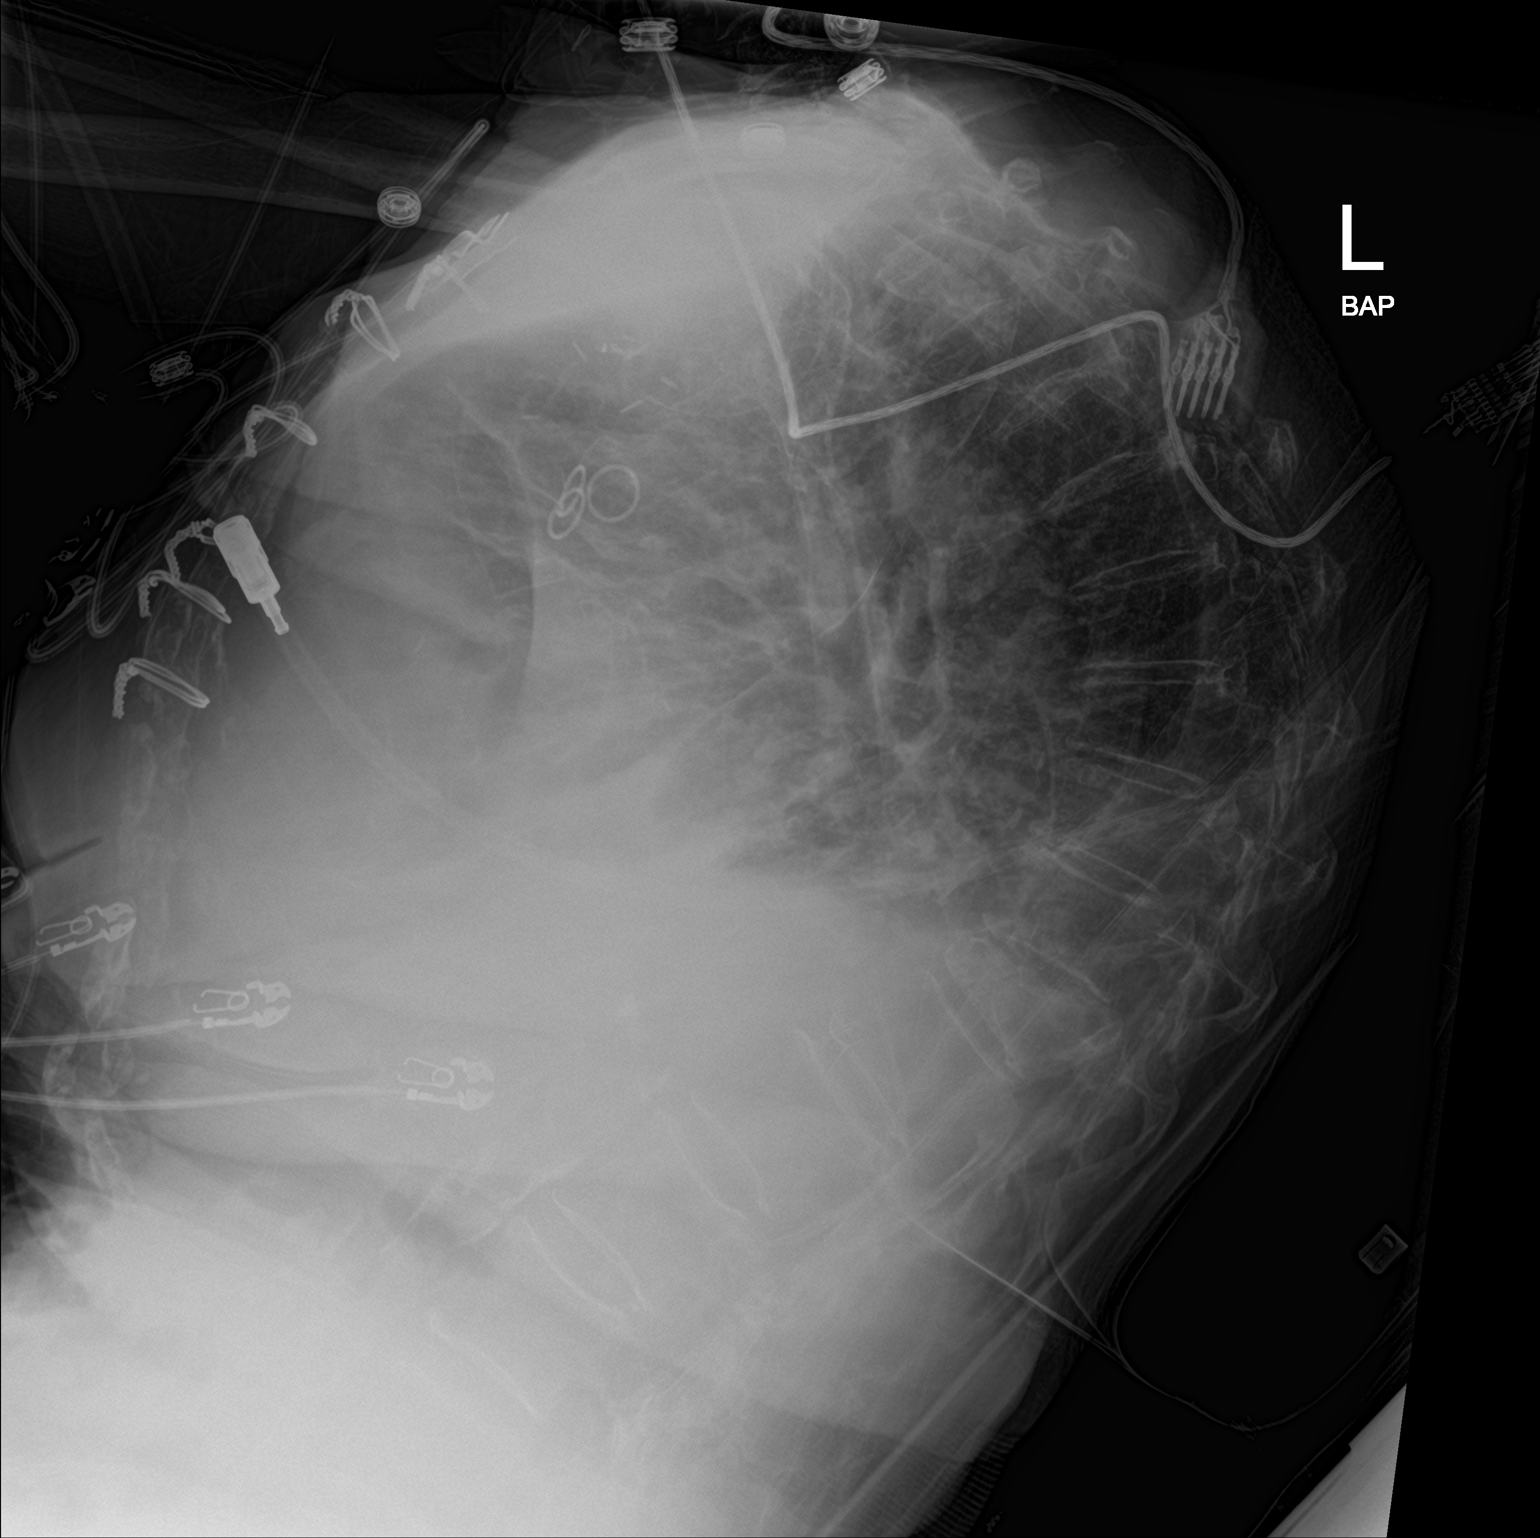

[2 of 2 positions shown; findings below may reference images not displayed]

FINDINGS: Small moderate bilateral pleural effusions, slightly increased on
the right side. Stable cardiomegaly with vascular congestion and
interstitial pulmonary edema. Worsened basilar airspace disease.
Post sternotomy changes. Moderate compression fracture with mild
sclerosis of a lower thoracic vertebra.
IMPRESSION: 1. Small moderate bilateral pleural effusions, increased on the
right side. Cardiomegaly with vascular congestion and mild pulmonary
edema
2. Bilateral lung base atelectasis or pneumonia.
3. Moderate lower thoracic compression fracture, approximate T11,
new since CT 02/04/2018
